# Patient Record
Sex: Male | Born: 1950
Health system: Southern US, Community
[De-identification: ages and names within clinical notes are randomized; demographics above are authoritative.]

## PROBLEM LIST (undated history)

## (undated) DIAGNOSIS — K760 Fatty (change of) liver, not elsewhere classified: Secondary | ICD-10-CM

## (undated) DIAGNOSIS — C61 Malignant neoplasm of prostate: Secondary | ICD-10-CM

## (undated) DIAGNOSIS — K5792 Diverticulitis of intestine, part unspecified, without perforation or abscess without bleeding: Secondary | ICD-10-CM

## (undated) DIAGNOSIS — I10 Essential (primary) hypertension: Secondary | ICD-10-CM

## (undated) DIAGNOSIS — M1990 Unspecified osteoarthritis, unspecified site: Secondary | ICD-10-CM

## (undated) DIAGNOSIS — I6529 Occlusion and stenosis of unspecified carotid artery: Secondary | ICD-10-CM

## (undated) DIAGNOSIS — E119 Type 2 diabetes mellitus without complications: Secondary | ICD-10-CM

## (undated) DIAGNOSIS — K219 Gastro-esophageal reflux disease without esophagitis: Secondary | ICD-10-CM

## (undated) DIAGNOSIS — J302 Other seasonal allergic rhinitis: Secondary | ICD-10-CM

## (undated) DIAGNOSIS — E785 Hyperlipidemia, unspecified: Secondary | ICD-10-CM

## (undated) DIAGNOSIS — M722 Plantar fascial fibromatosis: Secondary | ICD-10-CM

## (undated) DIAGNOSIS — Z87442 Personal history of urinary calculi: Secondary | ICD-10-CM

## (undated) DIAGNOSIS — Z8051 Family history of malignant neoplasm of kidney: Secondary | ICD-10-CM

## (undated) DIAGNOSIS — M109 Gout, unspecified: Secondary | ICD-10-CM

## (undated) DIAGNOSIS — G4733 Obstructive sleep apnea (adult) (pediatric): Secondary | ICD-10-CM

## (undated) HISTORY — DX: Essential (primary) hypertension: I10

## (undated) HISTORY — DX: Diverticulitis of intestine, part unspecified, without perforation or abscess without bleeding: K57.92

## (undated) HISTORY — DX: Type 2 diabetes mellitus without complications: E11.9

## (undated) HISTORY — DX: Family history of malignant neoplasm of kidney: Z80.51

## (undated) HISTORY — PX: PROSTATE BIOPSY: SHX241

## (undated) HISTORY — DX: Occlusion and stenosis of unspecified carotid artery: I65.29

## (undated) HISTORY — DX: Hyperlipidemia, unspecified: E78.5

## (undated) HISTORY — DX: Obstructive sleep apnea (adult) (pediatric): G47.33

## (undated) HISTORY — DX: Other seasonal allergic rhinitis: J30.2

## (undated) HISTORY — DX: Gout, unspecified: M10.9

## (undated) HISTORY — PX: NEPHRECTOMY: SHX65

---

## 2003-02-07 DIAGNOSIS — Z8051 Family history of malignant neoplasm of kidney: Secondary | ICD-10-CM

## 2003-02-07 HISTORY — DX: Family history of malignant neoplasm of kidney: Z80.51

## 2003-05-25 ENCOUNTER — Ambulatory Visit (HOSPITAL_BASED_OUTPATIENT_CLINIC_OR_DEPARTMENT_OTHER): Admission: RE | Admit: 2003-05-25 | Discharge: 2003-05-25 | Payer: Self-pay | Admitting: Pulmonary Disease

## 2003-07-24 ENCOUNTER — Observation Stay (HOSPITAL_COMMUNITY): Admission: EM | Admit: 2003-07-24 | Discharge: 2003-07-26 | Payer: Self-pay | Admitting: Emergency Medicine

## 2003-07-29 ENCOUNTER — Encounter (INDEPENDENT_AMBULATORY_CARE_PROVIDER_SITE_OTHER): Payer: Self-pay | Admitting: Specialist

## 2003-07-29 ENCOUNTER — Inpatient Hospital Stay (HOSPITAL_COMMUNITY): Admission: RE | Admit: 2003-07-29 | Discharge: 2003-08-01 | Payer: Self-pay | Admitting: Urology

## 2003-12-30 ENCOUNTER — Ambulatory Visit: Payer: Self-pay | Admitting: Family Medicine

## 2004-01-04 ENCOUNTER — Ambulatory Visit (HOSPITAL_COMMUNITY): Admission: RE | Admit: 2004-01-04 | Discharge: 2004-01-04 | Payer: Self-pay | Admitting: Urology

## 2004-01-05 ENCOUNTER — Encounter: Payer: Self-pay | Admitting: Family Medicine

## 2004-05-17 ENCOUNTER — Ambulatory Visit: Payer: Self-pay | Admitting: Family Medicine

## 2004-06-07 ENCOUNTER — Ambulatory Visit: Payer: Self-pay | Admitting: Family Medicine

## 2004-06-22 ENCOUNTER — Ambulatory Visit: Payer: Self-pay | Admitting: Family Medicine

## 2004-06-23 ENCOUNTER — Ambulatory Visit (HOSPITAL_COMMUNITY): Admission: RE | Admit: 2004-06-23 | Discharge: 2004-06-23 | Payer: Self-pay | Admitting: Urology

## 2004-06-23 ENCOUNTER — Encounter: Payer: Self-pay | Admitting: Family Medicine

## 2004-07-01 ENCOUNTER — Ambulatory Visit (HOSPITAL_COMMUNITY): Admission: RE | Admit: 2004-07-01 | Discharge: 2004-07-01 | Payer: Self-pay | Admitting: Urology

## 2004-07-13 ENCOUNTER — Ambulatory Visit (HOSPITAL_COMMUNITY): Admission: RE | Admit: 2004-07-13 | Discharge: 2004-07-13 | Payer: Self-pay | Admitting: Family Medicine

## 2004-07-13 ENCOUNTER — Encounter: Payer: Self-pay | Admitting: Family Medicine

## 2004-07-13 ENCOUNTER — Encounter (INDEPENDENT_AMBULATORY_CARE_PROVIDER_SITE_OTHER): Payer: Self-pay | Admitting: *Deleted

## 2004-07-19 ENCOUNTER — Ambulatory Visit: Payer: Self-pay | Admitting: Family Medicine

## 2004-07-20 ENCOUNTER — Encounter: Admission: RE | Admit: 2004-07-20 | Discharge: 2004-07-20 | Payer: Self-pay | Admitting: Family Medicine

## 2004-08-30 ENCOUNTER — Ambulatory Visit: Payer: Self-pay | Admitting: Cardiology

## 2004-09-20 ENCOUNTER — Ambulatory Visit: Payer: Self-pay

## 2005-06-12 ENCOUNTER — Ambulatory Visit (HOSPITAL_COMMUNITY): Admission: RE | Admit: 2005-06-12 | Discharge: 2005-06-12 | Payer: Self-pay | Admitting: Urology

## 2005-09-25 ENCOUNTER — Ambulatory Visit: Payer: Self-pay | Admitting: Family Medicine

## 2005-09-28 ENCOUNTER — Ambulatory Visit: Payer: Self-pay | Admitting: Family Medicine

## 2005-11-17 ENCOUNTER — Ambulatory Visit: Payer: Self-pay | Admitting: Family Medicine

## 2005-11-17 LAB — CONVERTED CEMR LAB
AST: 23 units/L (ref 0–37)
Albumin: 3.6 g/dL (ref 3.5–5.2)
BUN: 15 mg/dL (ref 6–23)
Basophils Absolute: 0 10*3/uL (ref 0.0–0.1)
CO2: 30 meq/L (ref 19–32)
Chloride: 105 meq/L (ref 96–112)
Creatinine, Ser: 1.6 mg/dL — ABNORMAL HIGH (ref 0.4–1.5)
Eosinophil percent: 6.7 % — ABNORMAL HIGH (ref 0.0–5.0)
GFR calc non Af Amer: 48 mL/min
Glomerular Filtration Rate, Af Am: 58 mL/min/{1.73_m2}
HCT: 46 % (ref 39.0–52.0)
Hemoglobin: 15.6 g/dL (ref 13.0–17.0)
MCHC: 33.9 g/dL (ref 30.0–36.0)
MCV: 91.9 fL (ref 78.0–100.0)
Monocytes Absolute: 0.5 10*3/uL (ref 0.2–0.7)
Neutrophils Relative %: 56.5 % (ref 43.0–77.0)
Total Bilirubin: 0.7 mg/dL (ref 0.3–1.2)
WBC: 6.3 10*3/uL (ref 4.5–10.5)

## 2005-11-28 ENCOUNTER — Ambulatory Visit: Payer: Self-pay | Admitting: Family Medicine

## 2005-12-13 ENCOUNTER — Ambulatory Visit: Payer: Self-pay | Admitting: Family Medicine

## 2006-06-19 ENCOUNTER — Ambulatory Visit (HOSPITAL_COMMUNITY): Admission: RE | Admit: 2006-06-19 | Discharge: 2006-06-19 | Payer: Self-pay | Admitting: Urology

## 2006-09-27 ENCOUNTER — Telehealth (INDEPENDENT_AMBULATORY_CARE_PROVIDER_SITE_OTHER): Payer: Self-pay | Admitting: *Deleted

## 2006-11-26 ENCOUNTER — Encounter: Admission: RE | Admit: 2006-11-26 | Discharge: 2006-11-26 | Payer: Self-pay | Admitting: General Surgery

## 2007-04-16 ENCOUNTER — Ambulatory Visit: Payer: Self-pay | Admitting: Family Medicine

## 2007-04-16 DIAGNOSIS — I10 Essential (primary) hypertension: Secondary | ICD-10-CM | POA: Insufficient documentation

## 2007-04-16 DIAGNOSIS — K5730 Diverticulosis of large intestine without perforation or abscess without bleeding: Secondary | ICD-10-CM | POA: Insufficient documentation

## 2007-04-16 DIAGNOSIS — Z8719 Personal history of other diseases of the digestive system: Secondary | ICD-10-CM | POA: Insufficient documentation

## 2007-04-19 ENCOUNTER — Encounter: Admission: RE | Admit: 2007-04-19 | Discharge: 2007-04-19 | Payer: Self-pay | Admitting: Gastroenterology

## 2007-06-18 ENCOUNTER — Ambulatory Visit (HOSPITAL_COMMUNITY): Admission: RE | Admit: 2007-06-18 | Discharge: 2007-06-18 | Payer: Self-pay | Admitting: Urology

## 2007-07-09 ENCOUNTER — Ambulatory Visit: Payer: Self-pay | Admitting: Family Medicine

## 2007-07-09 LAB — CONVERTED CEMR LAB
AST: 25 units/L (ref 0–37)
Albumin: 3.8 g/dL (ref 3.5–5.2)
Alkaline Phosphatase: 57 units/L (ref 39–117)
BUN: 20 mg/dL (ref 6–23)
Basophils Absolute: 0 10*3/uL (ref 0.0–0.1)
Bilirubin, Direct: 0.1 mg/dL (ref 0.0–0.3)
Chloride: 103 meq/L (ref 96–112)
Eosinophils Absolute: 0.3 10*3/uL (ref 0.0–0.7)
GFR calc Af Amer: 67 mL/min
GFR calc non Af Amer: 55 mL/min
HCT: 46.1 % (ref 39.0–52.0)
HDL: 35.6 mg/dL — ABNORMAL LOW (ref >39.0)
MCHC: 34.7 g/dL (ref 30.0–36.0)
MCV: 91.2 fL (ref 78.0–100.0)
Monocytes Absolute: 0.5 10*3/uL (ref 0.1–1.0)
Neutrophils Relative %: 56.8 % (ref 43.0–77.0)
Platelets: 247 10*3/uL (ref 150–400)
Potassium: 4.3 meq/L (ref 3.5–5.1)
RDW: 12.4 % (ref 11.5–14.6)
Sodium: 142 meq/L (ref 135–145)
Total Bilirubin: 1 mg/dL (ref 0.3–1.2)
Triglycerides: 153 mg/dL — ABNORMAL HIGH (ref 0–149)
VLDL: 31 mg/dL (ref 0–40)
WBC: 5.8 10*3/uL (ref 4.5–10.5)

## 2007-07-11 ENCOUNTER — Ambulatory Visit (HOSPITAL_BASED_OUTPATIENT_CLINIC_OR_DEPARTMENT_OTHER): Admission: RE | Admit: 2007-07-11 | Discharge: 2007-07-11 | Payer: Self-pay | Admitting: Urology

## 2007-10-15 ENCOUNTER — Ambulatory Visit: Payer: Self-pay | Admitting: Family Medicine

## 2007-10-15 DIAGNOSIS — IMO0002 Reserved for concepts with insufficient information to code with codable children: Secondary | ICD-10-CM | POA: Insufficient documentation

## 2007-11-07 ENCOUNTER — Encounter: Admission: RE | Admit: 2007-11-07 | Discharge: 2007-11-07 | Payer: Self-pay | Admitting: Orthopaedic Surgery

## 2007-11-14 ENCOUNTER — Ambulatory Visit: Payer: Self-pay | Admitting: Family Medicine

## 2007-11-14 DIAGNOSIS — J309 Allergic rhinitis, unspecified: Secondary | ICD-10-CM | POA: Insufficient documentation

## 2007-11-14 DIAGNOSIS — C649 Malignant neoplasm of unspecified kidney, except renal pelvis: Secondary | ICD-10-CM | POA: Insufficient documentation

## 2007-12-09 ENCOUNTER — Telehealth: Payer: Self-pay | Admitting: Family Medicine

## 2007-12-10 ENCOUNTER — Telehealth: Payer: Self-pay | Admitting: *Deleted

## 2007-12-13 ENCOUNTER — Telehealth: Payer: Self-pay | Admitting: Family Medicine

## 2008-07-09 ENCOUNTER — Ambulatory Visit (HOSPITAL_COMMUNITY): Admission: RE | Admit: 2008-07-09 | Discharge: 2008-07-09 | Payer: Self-pay | Admitting: Urology

## 2008-07-22 ENCOUNTER — Ambulatory Visit (HOSPITAL_COMMUNITY): Admission: RE | Admit: 2008-07-22 | Discharge: 2008-07-22 | Payer: Self-pay | Admitting: Urology

## 2008-07-31 ENCOUNTER — Ambulatory Visit: Payer: Self-pay | Admitting: Oncology

## 2008-07-31 ENCOUNTER — Telehealth: Payer: Self-pay | Admitting: Family Medicine

## 2008-09-04 ENCOUNTER — Ambulatory Visit: Payer: Self-pay | Admitting: Oncology

## 2008-12-18 ENCOUNTER — Encounter: Admission: RE | Admit: 2008-12-18 | Discharge: 2008-12-18 | Payer: Self-pay | Admitting: Gastroenterology

## 2009-02-04 ENCOUNTER — Ambulatory Visit: Payer: Self-pay | Admitting: Family Medicine

## 2009-02-06 HISTORY — PX: COLON SURGERY: SHX602

## 2009-03-10 ENCOUNTER — Ambulatory Visit: Payer: Self-pay | Admitting: Oncology

## 2009-06-11 ENCOUNTER — Ambulatory Visit: Payer: Self-pay | Admitting: Oncology

## 2009-07-13 ENCOUNTER — Ambulatory Visit: Payer: Self-pay | Admitting: Family Medicine

## 2009-07-13 DIAGNOSIS — R079 Chest pain, unspecified: Secondary | ICD-10-CM | POA: Insufficient documentation

## 2009-07-30 ENCOUNTER — Ambulatory Visit (HOSPITAL_COMMUNITY): Admission: RE | Admit: 2009-07-30 | Discharge: 2009-07-30 | Payer: Self-pay | Admitting: Urology

## 2009-08-06 HISTORY — PX: COLONOSCOPY: SHX174

## 2009-08-12 ENCOUNTER — Encounter: Payer: Self-pay | Admitting: Family Medicine

## 2009-08-27 ENCOUNTER — Inpatient Hospital Stay (HOSPITAL_COMMUNITY): Admission: RE | Admit: 2009-08-27 | Discharge: 2009-09-01 | Payer: Self-pay | Admitting: General Surgery

## 2009-08-27 ENCOUNTER — Encounter (INDEPENDENT_AMBULATORY_CARE_PROVIDER_SITE_OTHER): Payer: Self-pay | Admitting: General Surgery

## 2009-09-07 ENCOUNTER — Inpatient Hospital Stay (HOSPITAL_COMMUNITY): Admission: RE | Admit: 2009-09-07 | Discharge: 2009-09-15 | Payer: Self-pay | Admitting: General Surgery

## 2009-09-07 ENCOUNTER — Encounter: Admission: RE | Admit: 2009-09-07 | Discharge: 2009-09-07 | Payer: Self-pay | Admitting: General Surgery

## 2009-09-10 ENCOUNTER — Ambulatory Visit: Payer: Self-pay | Admitting: Oncology

## 2009-09-16 ENCOUNTER — Ambulatory Visit: Payer: Self-pay | Admitting: Oncology

## 2009-09-20 ENCOUNTER — Encounter: Admission: RE | Admit: 2009-09-20 | Discharge: 2009-09-20 | Payer: Self-pay | Admitting: General Surgery

## 2009-09-29 ENCOUNTER — Inpatient Hospital Stay (HOSPITAL_COMMUNITY): Admission: EM | Admit: 2009-09-29 | Discharge: 2009-10-04 | Payer: Self-pay | Admitting: Emergency Medicine

## 2009-10-01 ENCOUNTER — Ambulatory Visit: Payer: Self-pay | Admitting: Infectious Diseases

## 2009-11-01 ENCOUNTER — Encounter: Admission: RE | Admit: 2009-11-01 | Discharge: 2009-11-01 | Payer: Self-pay | Admitting: General Surgery

## 2009-11-09 ENCOUNTER — Ambulatory Visit: Payer: Self-pay | Admitting: Family Medicine

## 2009-12-02 ENCOUNTER — Ambulatory Visit: Payer: Self-pay | Admitting: Oncology

## 2009-12-06 ENCOUNTER — Encounter: Payer: Self-pay | Admitting: Family Medicine

## 2010-03-08 NOTE — Letter (Signed)
Summary: [facility]  [facility]   Imported By: Maryln Gottron 12/20/2009 09:21:20  _____________________________________________________________________  External Attachment:    Type:   Image     Comment:   External Document

## 2010-03-08 NOTE — Letter (Signed)
Summary: [facility]   Imported By: Maryln Gottron 08/23/2009 11:10:13  _____________________________________________________________________  External Attachment:    Type:   Image     Comment:   External Document

## 2010-03-08 NOTE — Assessment & Plan Note (Signed)
Summary: FLU SHOT // RS pt rsc/njr  Nurse Visit   Allergies: No Known Drug Allergies  Orders Added: 1)  Admin 1st Vaccine [90471] 2)  Flu Vaccine 46yrs + [57322] Flu Vaccine Consent Questions     Do you have a history of severe allergic reactions to this vaccine? no    Any prior history of allergic reactions to egg and/or gelatin? no    Do you have a sensitivity to the preservative Thimersol? no    Do you have a past history of Guillan-Barre Syndrome? no    Do you currently have an acute febrile illness? no    Have you ever had a severe reaction to latex? no    Vaccine information given and explained to patient? yes    Are you currently pregnant? no    Lot Number:[id number]   Exp Date:08/06/2010   Site Given  Left Deltoid IM .lbflu

## 2010-03-08 NOTE — Assessment & Plan Note (Signed)
Summary: chest pain/dm/ no sob on exertion, or other cardiac symptoms/dm   Vital Signs:  Patient profile:   60 year old male Weight:      185 pounds Temp:     98.2 degrees F oral BP sitting:   140 / 90  (left arm) Cuff size:   regular  Vitals Entered By: Kathrynn Speed CMA (July 13, 2009 3:51 PM) CC: chest pain   CC:  chest pain.  History of Present Illness: Jacob Stone is a 60 year old male, who comes in today for evaluation of chest pain.  He states last Thursday evening, around 5 p.m. he was sitting watching television when he noticed some discomfort in his mid sternal area.  He described as a tightness sensation that did not radiate.  He had no nausea, vomiting, shortness of breath at diaphoresis, nor radiation of the discomfort.  That night, when, on and off for 15 minutes for 45 hours and finally went away.  He has a history of reflux esophagitis.  On Friday the next day in the evening.  He had some other symptoms consistent with Thursday.  Since that, time.  He's had intermittent episodes.  In the meantime, he started to Jacob Stone's exercise, and indeed, the exercise seems to make the pain go away.  He has underlying hypertension on Zestril 40 mg two tabs daily, Norvasc 5 daily, otherwise, no risk factors  Current Medications (verified): 1)  Zyrtec Allergy 10 Mg Tabs (Cetirizine Hcl) 2)  Aspirin 325 Mg  Tabs (Aspirin) .... Take 1 Tablet By Mouth Once A Day 3)  Zestril 40 Mg  Tabs (Lisinopril) .... 2 Tabs Qam 4)  Norvasc 5 Mg Tabs (Amlodipine Besylate) .... Take 1 Tablet By Mouth Every Morning 5)  Anusol 1-12.5 % Oint (Pramoxine-Zinc Oxide in Mo) .... Apply At Bedtime  Allergies (verified): No Known Drug Allergies PMH-FH-SH reviewed-no changes except otherwise noted  Review of Systems      See HPI  Physical Exam  General:  Well-developed,well-nourished,in no acute distress; alert,appropriate and cooperative throughout examination Neck:  No deformities, masses, or tenderness  noted. Chest Wall:  No deformities, masses, tenderness or gynecomastia noted. Lungs:  Normal respiratory effort, chest expands symmetrically. Lungs are clear to auscultation, no crackles or wheezes. Heart:  Normal rate and regular rhythm. S1 and S2 normal without gallop, murmur, click, rub or other extra sounds.   Problems:  Medical Problems Added: 1)  Dx of Chest Pain  (ICD-786.50)  Impression & Recommendations:  Problem # 1:  CHEST PAIN (ICD-786.50) Assessment New  Orders: EKG w/ Interpretation (93000)  Complete Medication List: 1)  Zyrtec Allergy 10 Mg Tabs (Cetirizine hcl) 2)  Aspirin 325 Mg Tabs (Aspirin) .... Take 1 tablet by mouth once a day 3)  Zestril 40 Mg Tabs (Lisinopril) .... 2 tabs qam 4)  Norvasc 5 Mg Tabs (Amlodipine besylate) .... Take 1 tablet by mouth every morning 5)  Anusol 1-12.5 % Oint (Pramoxine-zinc oxide in mo) .... Apply at bedtime  Patient Instructions: 1)  discussed with your gastroenterologist at your next visit   The discomfort, your having.  I think, is most likely GI related.  In the meantime take it 20 mg OTC Prilosec twice daily

## 2010-03-22 ENCOUNTER — Encounter: Payer: Self-pay | Admitting: Family Medicine

## 2010-03-22 ENCOUNTER — Ambulatory Visit (INDEPENDENT_AMBULATORY_CARE_PROVIDER_SITE_OTHER): Payer: Managed Care, Other (non HMO) | Admitting: Family Medicine

## 2010-03-22 DIAGNOSIS — L298 Other pruritus: Secondary | ICD-10-CM

## 2010-03-22 DIAGNOSIS — I10 Essential (primary) hypertension: Secondary | ICD-10-CM

## 2010-03-22 DIAGNOSIS — IMO0002 Reserved for concepts with insufficient information to code with codable children: Secondary | ICD-10-CM

## 2010-03-22 MED ORDER — PREDNISONE 20 MG PO TABS: ORAL_TABLET | ORAL | Status: DC

## 2010-03-22 NOTE — Patient Instructions (Signed)
Prednisone, take one tab x 5 days, a half x 5 days, then half a tablet every other day for a two week taper.  You can take plain Claritin in the morning or Benadryl at bedtime.  Continue the Norvasc 5 mg daily.  Return for your annual physical examination

## 2010-03-22 NOTE — Progress Notes (Signed)
  Subjective:    Patient ID: Jacob Stone, male    DOB: Aug 03, 1950, 60 y.o.   MRN: [id number]  HPI Jacob Stone is a 60 year old, married male, nonsmoker, who comes in today for evaluation of pruritus times 6+ weeks.  He had a history of dry skin.  Allergic rhinitis in the winter.  It should previously.  However, this latter seems to be worse and he itches all the time.  No skin rash.  His dermatologist, Dr. Yetta Barre.  Advised a moisturizer, but it doesn't seem to be working he's also tried Benadryl.  He is currently on Norvasc 5 mg daily for hypertension.  BP 130/90.  The lisinopril was discontinued because he had a nephrectomy for renal cell carcinoma, and they were concerned about him only having one kidney and the potential negative effect of the ACE inhibitor and that kidney   Review of Systems    Negative Objective:   Physical Exam    Well-developed well-nourished, male in no acute distress.  Total skin exam normal except is very dry.  No skin rash    Assessment & Plan:  Winter itch.  Hypertension.  Plan prednisone burst and taper then go back to normal treatment for dry skin.  Continue the Norvasc for your blood pressure

## 2010-04-21 LAB — COMPREHENSIVE METABOLIC PANEL
ALT: 12 U/L (ref 0–53)
ALT: 8 U/L (ref 0–53)
AST: 10 U/L (ref 0–37)
AST: 12 U/L (ref 0–37)
AST: 18 U/L (ref 0–37)
Albumin: 2.8 g/dL — ABNORMAL LOW (ref 3.5–5.2)
Albumin: 3 g/dL — ABNORMAL LOW (ref 3.5–5.2)
Albumin: 3.3 g/dL — ABNORMAL LOW (ref 3.5–5.2)
Alkaline Phosphatase: 42 U/L (ref 39–117)
Alkaline Phosphatase: 52 U/L (ref 39–117)
BUN: 10 mg/dL (ref 6–23)
BUN: 18 mg/dL (ref 6–23)
CO2: 24 mEq/L (ref 19–32)
CO2: 28 mEq/L (ref 19–32)
Calcium: 8.6 mg/dL (ref 8.4–10.5)
Chloride: 108 mEq/L (ref 96–112)
Chloride: 109 mEq/L (ref 96–112)
GFR calc Af Amer: >60 mL/min (ref >60)
GFR calc non Af Amer: 44 mL/min — ABNORMAL LOW (ref >60)
Potassium: 3.2 mEq/L — ABNORMAL LOW (ref 3.5–5.1)
Potassium: 4.2 mEq/L (ref 3.5–5.1)
Sodium: 139 mEq/L (ref 135–145)
Sodium: 142 mEq/L (ref 135–145)
Total Bilirubin: 0.2 mg/dL — ABNORMAL LOW (ref 0.3–1.2)
Total Bilirubin: 0.8 mg/dL (ref 0.3–1.2)
Total Protein: 6.3 g/dL (ref 6.0–8.3)

## 2010-04-21 LAB — CBC
Hemoglobin: 11.1 g/dL — ABNORMAL LOW (ref 13.0–17.0)
Hemoglobin: 11.3 g/dL — ABNORMAL LOW (ref 13.0–17.0)
Hemoglobin: 12.6 g/dL — ABNORMAL LOW (ref 13.0–17.0)
MCH: 30.4 pg (ref 26.0–34.0)
MCHC: 33.5 g/dL (ref 30.0–36.0)
MCHC: 33.6 g/dL (ref 30.0–36.0)
MCHC: 34.1 g/dL (ref 30.0–36.0)
MCV: 89.9 fL (ref 78.0–100.0)
MCV: 90.8 fL (ref 78.0–100.0)
Platelets: 327 10*3/uL (ref 150–400)
Platelets: 385 10*3/uL (ref 150–400)
Platelets: 417 10*3/uL — ABNORMAL HIGH (ref 150–400)
RBC: 3.64 MIL/uL — ABNORMAL LOW (ref 4.22–5.81)
RBC: 3.93 MIL/uL — ABNORMAL LOW (ref 4.22–5.81)
RBC: 4.09 MIL/uL — ABNORMAL LOW (ref 4.22–5.81)
RBC: 4.11 MIL/uL — ABNORMAL LOW (ref 4.22–5.81)
RDW: 13 % (ref 11.5–15.5)
WBC: 13.1 10*3/uL — ABNORMAL HIGH (ref 4.0–10.5)
WBC: 7.7 10*3/uL (ref 4.0–10.5)
WBC: 8.5 10*3/uL (ref 4.0–10.5)

## 2010-04-21 LAB — DIFFERENTIAL
Basophils Absolute: 0 10*3/uL (ref 0.0–0.1)
Basophils Absolute: 0.1 10*3/uL (ref 0.0–0.1)
Basophils Absolute: 0.1 10*3/uL (ref 0.0–0.1)
Basophils Relative: 0 % (ref 0–1)
Basophils Relative: 1 % (ref 0–1)
Basophils Relative: 1 % (ref 0–1)
Eosinophils Absolute: 0.2 10*3/uL (ref 0.0–0.7)
Eosinophils Absolute: 0.2 10*3/uL (ref 0.0–0.7)
Eosinophils Absolute: 0.3 10*3/uL (ref 0.0–0.7)
Eosinophils Relative: 0 % (ref 0–5)
Eosinophils Relative: 3 % (ref 0–5)
Eosinophils Relative: 4 % (ref 0–5)
Lymphs Abs: 1.4 10*3/uL (ref 0.7–4.0)
Lymphs Abs: 1.6 10*3/uL (ref 0.7–4.0)
Monocytes Absolute: 0.7 10*3/uL (ref 0.1–1.0)
Monocytes Absolute: 0.9 10*3/uL (ref 0.1–1.0)
Monocytes Relative: 10 % (ref 3–12)
Monocytes Relative: 8 % (ref 3–12)
Neutro Abs: 5.8 10*3/uL (ref 1.7–7.7)
Neutrophils Relative %: 61 % (ref 43–77)

## 2010-04-21 LAB — PHOSPHORUS: Phosphorus: 4.9 mg/dL — ABNORMAL HIGH (ref 2.3–4.6)

## 2010-04-21 LAB — BASIC METABOLIC PANEL
CO2: 28 mEq/L (ref 19–32)
Calcium: 8.4 mg/dL (ref 8.4–10.5)
Calcium: 8.8 mg/dL (ref 8.4–10.5)
Calcium: 8.8 mg/dL (ref 8.4–10.5)
Creatinine, Ser: 1.12 mg/dL (ref 0.4–1.5)
GFR calc Af Amer: 53 mL/min — ABNORMAL LOW (ref >60)
GFR calc Af Amer: >60 mL/min (ref >60)
GFR calc Af Amer: >60 mL/min (ref >60)
GFR calc non Af Amer: 44 mL/min — ABNORMAL LOW (ref >60)
GFR calc non Af Amer: >60 mL/min (ref >60)
Sodium: 140 mEq/L (ref 135–145)
Sodium: 143 mEq/L (ref 135–145)

## 2010-04-21 LAB — GLUCOSE, CAPILLARY
Glucose-Capillary: 114 mg/dL — ABNORMAL HIGH (ref 70–99)
Glucose-Capillary: 121 mg/dL — ABNORMAL HIGH (ref 70–99)
Glucose-Capillary: 124 mg/dL — ABNORMAL HIGH (ref 70–99)
Glucose-Capillary: 130 mg/dL — ABNORMAL HIGH (ref 70–99)
Glucose-Capillary: 132 mg/dL — ABNORMAL HIGH (ref 70–99)
Glucose-Capillary: 134 mg/dL — ABNORMAL HIGH (ref 70–99)

## 2010-04-21 LAB — RENAL FUNCTION PANEL
Albumin: 2.9 g/dL — ABNORMAL LOW (ref 3.5–5.2)
BUN: 10 mg/dL (ref 6–23)
Calcium: 8.8 mg/dL (ref 8.4–10.5)
Creatinine, Ser: 1.14 mg/dL (ref 0.4–1.5)
Phosphorus: 4.9 mg/dL — ABNORMAL HIGH (ref 2.3–4.6)

## 2010-04-21 LAB — URINALYSIS, ROUTINE W REFLEX MICROSCOPIC
Glucose, UA: NEGATIVE mg/dL
Leukocytes, UA: NEGATIVE
Protein, ur: NEGATIVE mg/dL
Specific Gravity, Urine: 1.012 (ref 1.005–1.030)
pH: 5.5 (ref 5.0–8.0)

## 2010-04-21 LAB — MAGNESIUM
Magnesium: 1.9 mg/dL (ref 1.5–2.5)
Magnesium: 2 mg/dL (ref 1.5–2.5)

## 2010-04-21 LAB — PREALBUMIN: Prealbumin: 15.4 mg/dL — ABNORMAL LOW (ref 18.0–45.0)

## 2010-04-21 LAB — URINE MICROSCOPIC-ADD ON

## 2010-04-21 LAB — URINE CULTURE: Colony Count: NO GROWTH

## 2010-04-21 LAB — CULTURE, BLOOD (ROUTINE X 2): Culture: NO GROWTH

## 2010-04-22 LAB — BASIC METABOLIC PANEL
BUN: 10 mg/dL (ref 6–23)
BUN: 14 mg/dL (ref 6–23)
CO2: 28 mEq/L (ref 19–32)
CO2: 29 mEq/L (ref 19–32)
Calcium: 9 mg/dL (ref 8.4–10.5)
Calcium: 9.1 mg/dL (ref 8.4–10.5)
Chloride: 104 mEq/L (ref 96–112)
Chloride: 105 mEq/L (ref 96–112)
Chloride: 106 mEq/L (ref 96–112)
Creatinine, Ser: 1 mg/dL (ref 0.4–1.5)
Creatinine, Ser: 1.05 mg/dL (ref 0.4–1.5)
Creatinine, Ser: 1.22 mg/dL (ref 0.4–1.5)
GFR calc Af Amer: >60 mL/min (ref >60)
GFR calc Af Amer: >60 mL/min (ref >60)
GFR calc Af Amer: >60 mL/min (ref >60)
GFR calc non Af Amer: >60 mL/min (ref >60)
GFR calc non Af Amer: >60 mL/min (ref >60)
GFR calc non Af Amer: >60 mL/min (ref >60)
Glucose, Bld: 119 mg/dL — ABNORMAL HIGH (ref 70–99)
Glucose, Bld: 124 mg/dL — ABNORMAL HIGH (ref 70–99)
Potassium: 4 mEq/L (ref 3.5–5.1)
Potassium: 4.1 mEq/L (ref 3.5–5.1)
Potassium: 4.4 mEq/L (ref 3.5–5.1)
Sodium: 140 mEq/L (ref 135–145)
Sodium: 141 mEq/L (ref 135–145)

## 2010-04-22 LAB — COMPREHENSIVE METABOLIC PANEL
ALT: 17 U/L (ref 0–53)
ALT: 20 U/L (ref 0–53)
ALT: 22 U/L (ref 0–53)
AST: 14 U/L (ref 0–37)
AST: 15 U/L (ref 0–37)
AST: 18 U/L (ref 0–37)
Albumin: 3.1 g/dL — ABNORMAL LOW (ref 3.5–5.2)
Albumin: 3.1 g/dL — ABNORMAL LOW (ref 3.5–5.2)
Albumin: 3.6 g/dL (ref 3.5–5.2)
Alkaline Phosphatase: 57 U/L (ref 39–117)
Alkaline Phosphatase: 70 U/L (ref 39–117)
BUN: 10 mg/dL (ref 6–23)
BUN: 12 mg/dL (ref 6–23)
CO2: 25 mEq/L (ref 19–32)
CO2: 27 mEq/L (ref 19–32)
CO2: 28 mEq/L (ref 19–32)
Calcium: 8.9 mg/dL (ref 8.4–10.5)
Calcium: 9 mg/dL (ref 8.4–10.5)
Calcium: 9.2 mg/dL (ref 8.4–10.5)
Chloride: 102 mEq/L (ref 96–112)
Chloride: 105 mEq/L (ref 96–112)
Creatinine, Ser: 1.15 mg/dL (ref 0.4–1.5)
Creatinine, Ser: 1.18 mg/dL (ref 0.4–1.5)
GFR calc Af Amer: >60 mL/min (ref >60)
GFR calc Af Amer: >60 mL/min (ref >60)
GFR calc Af Amer: >60 mL/min (ref >60)
GFR calc non Af Amer: >60 mL/min (ref >60)
GFR calc non Af Amer: >60 mL/min (ref >60)
GFR calc non Af Amer: >60 mL/min (ref >60)
Glucose, Bld: 107 mg/dL — ABNORMAL HIGH (ref 70–99)
Glucose, Bld: 117 mg/dL — ABNORMAL HIGH (ref 70–99)
Potassium: 4 mEq/L (ref 3.5–5.1)
Potassium: 4 mEq/L (ref 3.5–5.1)
Sodium: 136 mEq/L (ref 135–145)
Sodium: 140 mEq/L (ref 135–145)
Sodium: 141 mEq/L (ref 135–145)
Total Bilirubin: 0.6 mg/dL (ref 0.3–1.2)
Total Bilirubin: 0.8 mg/dL (ref 0.3–1.2)
Total Protein: 6.5 g/dL (ref 6.0–8.3)
Total Protein: 6.6 g/dL (ref 6.0–8.3)
Total Protein: 7.6 g/dL (ref 6.0–8.3)

## 2010-04-22 LAB — CBC
HCT: 38.3 % — ABNORMAL LOW (ref 39.0–52.0)
HCT: 40.7 % (ref 39.0–52.0)
Hemoglobin: 13.2 g/dL (ref 13.0–17.0)
Hemoglobin: 13.5 g/dL (ref 13.0–17.0)
Hemoglobin: 13.6 g/dL (ref 13.0–17.0)
Hemoglobin: 14 g/dL (ref 13.0–17.0)
MCH: 31.2 pg (ref 26.0–34.0)
MCH: 31.3 pg (ref 26.0–34.0)
MCH: 31.3 pg (ref 26.0–34.0)
MCHC: 34.4 g/dL (ref 30.0–36.0)
MCHC: 34.5 g/dL (ref 30.0–36.0)
MCHC: 34.7 g/dL (ref 30.0–36.0)
MCHC: 34.8 g/dL (ref 30.0–36.0)
MCV: 90.7 fL (ref 78.0–100.0)
MCV: 90.8 fL (ref 78.0–100.0)
Platelets: 381 10*3/uL (ref 150–400)
Platelets: 409 10*3/uL — ABNORMAL HIGH (ref 150–400)
Platelets: 411 10*3/uL — ABNORMAL HIGH (ref 150–400)
Platelets: 430 10*3/uL — ABNORMAL HIGH (ref 150–400)
RBC: 4.22 MIL/uL (ref 4.22–5.81)
RBC: 4.32 MIL/uL (ref 4.22–5.81)
RBC: 4.48 MIL/uL (ref 4.22–5.81)
RDW: 12.7 % (ref 11.5–15.5)
RDW: 12.7 % (ref 11.5–15.5)
WBC: 12 10*3/uL — ABNORMAL HIGH (ref 4.0–10.5)
WBC: 7.9 10*3/uL (ref 4.0–10.5)

## 2010-04-22 LAB — GLUCOSE, CAPILLARY
Glucose-Capillary: 114 mg/dL — ABNORMAL HIGH (ref 70–99)
Glucose-Capillary: 116 mg/dL — ABNORMAL HIGH (ref 70–99)
Glucose-Capillary: 121 mg/dL — ABNORMAL HIGH (ref 70–99)
Glucose-Capillary: 124 mg/dL — ABNORMAL HIGH (ref 70–99)
Glucose-Capillary: 126 mg/dL — ABNORMAL HIGH (ref 70–99)
Glucose-Capillary: 129 mg/dL — ABNORMAL HIGH (ref 70–99)
Glucose-Capillary: 135 mg/dL — ABNORMAL HIGH (ref 70–99)
Glucose-Capillary: 143 mg/dL — ABNORMAL HIGH (ref 70–99)
Glucose-Capillary: 144 mg/dL — ABNORMAL HIGH (ref 70–99)

## 2010-04-22 LAB — DIFFERENTIAL
Eosinophils Absolute: 0.3 10*3/uL (ref 0.0–0.7)
Eosinophils Absolute: 0.4 10*3/uL (ref 0.0–0.7)
Eosinophils Relative: 3 % (ref 0–5)
Eosinophils Relative: 5 % (ref 0–5)
Lymphocytes Relative: 19 % (ref 12–46)
Lymphs Abs: 1.6 10*3/uL (ref 0.7–4.0)
Lymphs Abs: 1.7 10*3/uL (ref 0.7–4.0)
Monocytes Absolute: 0.6 10*3/uL (ref 0.1–1.0)
Monocytes Absolute: 0.7 10*3/uL (ref 0.1–1.0)
Monocytes Relative: 7 % (ref 3–12)
Monocytes Relative: 9 % (ref 3–12)
Neutrophils Relative %: 66 % (ref 43–77)

## 2010-04-22 LAB — TRIGLYCERIDES
Triglycerides: 122 mg/dL (ref <150)
Triglycerides: 135 mg/dL (ref <150)

## 2010-04-22 LAB — CHOLESTEROL, TOTAL
Cholesterol: 122 mg/dL (ref 0–200)
Cholesterol: 96 mg/dL (ref 0–200)

## 2010-04-22 LAB — PHOSPHORUS
Phosphorus: 4 mg/dL (ref 2.3–4.6)
Phosphorus: 4.3 mg/dL (ref 2.3–4.6)

## 2010-04-22 LAB — PREALBUMIN: Prealbumin: 18.3 mg/dL (ref 18.0–45.0)

## 2010-04-22 LAB — MAGNESIUM
Magnesium: 2.2 mg/dL (ref 1.5–2.5)
Magnesium: 2.2 mg/dL (ref 1.5–2.5)

## 2010-04-23 LAB — CBC
HCT: 44.4 % (ref 39.0–52.0)
Hemoglobin: 12.9 g/dL — ABNORMAL LOW (ref 13.0–17.0)
Hemoglobin: 15.5 g/dL (ref 13.0–17.0)
MCHC: 34.2 g/dL (ref 30.0–36.0)
MCHC: 34.9 g/dL (ref 30.0–36.0)
MCV: 89.6 fL (ref 78.0–100.0)
RBC: 4.16 MIL/uL — ABNORMAL LOW (ref 4.22–5.81)
RDW: 12.9 % (ref 11.5–15.5)
WBC: 7.9 10*3/uL (ref 4.0–10.5)

## 2010-04-23 LAB — SURGICAL PCR SCREEN
MRSA, PCR: NEGATIVE
Staphylococcus aureus: NEGATIVE

## 2010-04-23 LAB — URINALYSIS, ROUTINE W REFLEX MICROSCOPIC
Bilirubin Urine: NEGATIVE
Glucose, UA: NEGATIVE mg/dL
Ketones, ur: NEGATIVE mg/dL
Protein, ur: NEGATIVE mg/dL
pH: 5.5 (ref 5.0–8.0)

## 2010-04-23 LAB — BASIC METABOLIC PANEL
CO2: 28 mEq/L (ref 19–32)
Calcium: 7.9 mg/dL — ABNORMAL LOW (ref 8.4–10.5)
Calcium: 8.1 mg/dL — ABNORMAL LOW (ref 8.4–10.5)
Chloride: 108 mEq/L (ref 96–112)
GFR calc Af Amer: 56 mL/min — ABNORMAL LOW (ref >60)
GFR calc Af Amer: >60 mL/min (ref >60)
GFR calc non Af Amer: 46 mL/min — ABNORMAL LOW (ref >60)
GFR calc non Af Amer: >60 mL/min (ref >60)
Glucose, Bld: 110 mg/dL — ABNORMAL HIGH (ref 70–99)
Glucose, Bld: 117 mg/dL — ABNORMAL HIGH (ref 70–99)
Potassium: 4.3 mEq/L (ref 3.5–5.1)
Potassium: 4.6 mEq/L (ref 3.5–5.1)
Potassium: 5.1 mEq/L (ref 3.5–5.1)
Sodium: 135 mEq/L (ref 135–145)
Sodium: 138 mEq/L (ref 135–145)
Sodium: 141 mEq/L (ref 135–145)

## 2010-04-23 LAB — DIFFERENTIAL
Eosinophils Absolute: 0.3 10*3/uL (ref 0.0–0.7)
Eosinophils Relative: 6 % — ABNORMAL HIGH (ref 0–5)
Lymphocytes Relative: 30 % (ref 12–46)
Lymphs Abs: 1.7 10*3/uL (ref 0.7–4.0)
Monocytes Absolute: 0.5 10*3/uL (ref 0.1–1.0)

## 2010-04-23 LAB — COMPREHENSIVE METABOLIC PANEL
ALT: 22 U/L (ref 0–53)
AST: 18 U/L (ref 0–37)
Albumin: 4 g/dL (ref 3.5–5.2)
CO2: 27 mEq/L (ref 19–32)
Calcium: 9 mg/dL (ref 8.4–10.5)
Chloride: 107 mEq/L (ref 96–112)
Creatinine, Ser: 1.26 mg/dL (ref 0.4–1.5)
GFR calc Af Amer: >60 mL/min (ref >60)
GFR calc non Af Amer: 58 mL/min — ABNORMAL LOW (ref >60)
Sodium: 141 mEq/L (ref 135–145)

## 2010-04-23 LAB — URINE MICROSCOPIC-ADD ON

## 2010-05-16 LAB — GLUCOSE, CAPILLARY: Glucose-Capillary: 111 mg/dL — ABNORMAL HIGH (ref 70–99)

## 2010-06-03 ENCOUNTER — Encounter (HOSPITAL_BASED_OUTPATIENT_CLINIC_OR_DEPARTMENT_OTHER): Payer: Managed Care, Other (non HMO) | Admitting: Oncology

## 2010-06-03 DIAGNOSIS — Z85528 Personal history of other malignant neoplasm of kidney: Secondary | ICD-10-CM

## 2010-06-03 DIAGNOSIS — R1909 Other intra-abdominal and pelvic swelling, mass and lump: Secondary | ICD-10-CM

## 2010-06-03 DIAGNOSIS — Z905 Acquired absence of kidney: Secondary | ICD-10-CM

## 2010-06-21 NOTE — Op Note (Signed)
NAME:  Jacob Stone, Jacob Stone NO.:  [id number]   MEDICAL RECORD NO.:  [id number]          PATIENT TYPE:  AMB   LOCATION:  [facility]                         FACILITY:  [facility]   PHYSICIAN:  Mark C. Vernie Ammons, M.D.  DATE OF BIRTH:  05/14/1950   DATE OF PROCEDURE:  07/11/2007  DATE OF DISCHARGE:                               OPERATIVE REPORT   PREOPERATIVE DIAGNOSIS:  Right hydrocele.   POSTOPERATIVE DIAGNOSIS:  Right hydrocele.   PROCEDURE:  Right hydrocelectomy.   SURGEON:  Mark C. Vernie Ammons, M.D.   ANESTHESIA:  General.   BLOOD LOSS:  Minimal.   DRAINS:  None.   SPECIMENS:  None.   COMPLICATIONS:  None.   INDICATIONS:  The patient is a 60 year old male with a large tense right  hydrocele.  It has increased in size over time.  It has become not only  a cosmetic nuisance, but it is interfering with his activities of daily  living and we have discussed treatment options.  He understands the  risks, complications, and alternatives of hydrocelectomy and has elected  to proceed with this.   DESCRIPTION OF OPERATION:  After informed consent, the patient was  brought to the major OR, placed on the table, and administered general  anesthesia in the supine position.  His genitalia was sterilely prepped  and draped, and an official time-out was then performed.   A midline median raphe incision was then made, and the tissue overlying  the hydrocele on the right-hand side was then cleared using a  combination of sharp and blunt technique.  The hydrocele was then  developed through the scrotal incision, and the remaining surrounding  tissue was removed from the hydrocele sac and the sac opened, drained of  clear amber fluid, and then incised on the anterior surface proximally  and distally.  This allowed the hydrocele sac then to be opened and  inverted behind the testicle.  The edges were then cauterized and then  reapproximated with running locking 3-0 chromic suture.  I then  replaced  the testicle in the right hemiscrotum, the wound was irrigated with  saline, and the deep scrotal tissue was then reapproximated with running  3-0 chromic suture.  0.5% plain Marcaine was then used to infiltrate the  subcutaneous tissue circumferentially around the incision, and the skin  edges of the incision were then reapproximated with running 3-0 chromic  suture.  Neosporin, sterile 4 x 4s, fluffed Kerlix, and a scrotal  support were then applied.   The patient was awakened and taken to the recovery room in stable and  satisfactory condition.  He tolerated the procedure well.  There were no  intraoperative complications.   He will be given a prescription for 38 Tylox and to stay on Keflex 500  mg t.i.d. for 5 days.  He will follow up in my office in 4 weeks and  sooner should he have any difficulty.  He was given written instructions  of the time of discharge.      Mark C. Vernie Ammons, M.D.  Electronically Signed     MCO/MEDQ  D:  07/11/2007  T:  07/11/2007  Job:  [id number]

## 2010-06-24 NOTE — Assessment & Plan Note (Signed)
[facility]                                   ON-CALL NOTE   Jacob, [name]                       MRN:          [id number]  DATE:09/24/2005                            DOB:          15-Dec-1950    Patient of Dr. Tawanna Cooler.   Blood pressure is elevated.  He is not feeling well.  It is running 180/110  and he is not sure why.  He has taken his medicines.  Advised to rest at  home today, double up on his ACE inhibitor, come to the office 8:15 in the  morning for eval.                                   Tinnie Gens A. Tawanna Cooler, MD   JAT/MedQ  DD:  09/24/2005  DT:  09/24/2005  Job #:  [id number]

## 2010-06-24 NOTE — Discharge Summary (Signed)
NAME:  Jacob [name], Jacob [name] NO.:  [id number]   MEDICAL RECORD NO.:  [id number]                   PATIENT TYPE:  INP   LOCATION:  0379                                 FACILITY:  [facility]   PHYSICIAN:  Mark C. Vernie Ammons, M.D.               DATE OF BIRTH:  06-14-1950   DATE OF ADMISSION:  07/29/2003  DATE OF DISCHARGE:  08/01/2003                                 DISCHARGE SUMMARY   PRINCIPAL DIAGNOSIS:  Right renal cell carcinoma.   OTHER DIAGNOSES:  1. History of diverticulosis.  2. Hypertension.  3. Urolithiasis.   MAJOR OPERATION:  Right radical nephrectomy and cystoscopy.   DISPOSITION:  The patient is discharged home in stable satisfactory and  improved condition.  He has tolerated a regular diet.  His wound is healing  well with no sign of infection.  His diet will be unrestricted and he will  follow up in my office in 1 week for skin staple removal.  His activity will  be limited to no heavy lifting, straining, or vigorous activity.   DISCHARGE MEDICATIONS:  Vicodin 1-2 q.4h. p.r.n. pain.   BRIEF HISTORY:  The patient is a 60 year old white male who was having lower  abdominal pain prompting a CT scan of the abdomen which revealed a solid,  enhancing, 3.6 x 3.2 cm mass in the lower pole of the right kidney.  There  was also findings consistent with suspected diverticulitis for which he was  admitted.  His diverticulitis was treated and that resolved.  He was  readmitted at this time for elective right radical nephrectomy based on CT  criteria of a solid enhancing mass in his right kidney.  We discussed the  risks, complications,  alternatives, and limitations to this approach.  The  remainder of his history and physical was previously dictated and is noted  in his hospital chart and will not be re-dictated here.   HOSPITAL COURSE:  On July 29, 2003, the patient underwent a right radical  nephrectomy.  Intraoperatively, the frozen section of the lesion  revealed it  was renal cell carcinoma.  Because of his hematuria, a cystoscopy was also  performed and found to be negative.  The night of his surgery he was doing  well.  He was using PCA for pain control and his urine remained clear.  The  following day, he began ambulating.  His hemoglobin was noted to be 13.0 and  creatinine of 1.6.  His diet was advanced to regular.  Pulmonary toilet was  encouraged and by his second postoperative day, he was voiding without any  difficulty.  He said he still felt weak.  He had a maximum temperature of  101.0, his incision had no sign of infection, and his pathology returned PT  1A clear cell grade 2-4 which was discussed with the patient.  He was felt  possibly ready for discharge, but became  nauseated that day.  He had no had  a bowel movement, so he was given antiemetics and laxatives.  His nausea  resolved.  He was feeling much better.  He was having normal bowel sounds  and felt ready for discharge on his third postoperative day with the above  instruction.                                              Mark C. Vernie Ammons, M.D.   MCO/MEDQ  D:  09/04/2003  T:  09/04/2003  Job:  [id number]

## 2010-06-24 NOTE — Consult Note (Signed)
NAME:  MARCELO, Jacob [name] NO.:  [id number]   MEDICAL RECORD NO.:  [id number]                   PATIENT TYPE:  INP   LOCATION:  0483                                 FACILITY:  [facility]   PHYSICIAN:  Mark C. Vernie Ammons, M.D.               DATE OF BIRTH:  1950/08/22   DATE OF CONSULTATION:  07/24/2003  DATE OF DISCHARGE:                                   CONSULTATION   ADDENDUM:  [name], his wife, and I spent a great deal of time together  this afternoon, discussing the fact that he has an approximately 3.5 cm  solid mass in the lower pole of his right kidney.  It impinges on the  collecting system, and he has had a single episode of gross hematuria  several months ago.  I told them that this is undoubtedly some form of  neoplasm and that biopsy is not typically indicated, as a negative biopsy  does not rule out malignancy.  Because of that, I have recommended surgical  therapy.  The lesion is of a size and position that partial nephrectomy is  not an option; therefore, he would need to undergo a right radical  nephrectomy.  He appears to have normal renal function with a creatinine of  1.2 and normal-appearing left kidney with normal function by CT scan.  We  discussed that there is a remote possibility, because of the hematuria, that  it could be transitional cell carcinoma, which would necessitate a distal  ureterectomy as well.  There is also an outside chance that this could be a  benign tumor such as an oncocytoma.   I discussed the incision that would be used in the anticipated hospital  course and the risks and complications, including but not limited to  bleeding, infection, anesthetic risk, DVT, MI, PE, etc.  We also discussed  injury to surrounding organs such as the bowels, liver, pancreas, and  pleura.  We discussed possible need for chest tube due to pneumothorax.   He and his wife are very anxious and would like to proceed with surgical  therapy  as soon as possible.  My concern is that he appears to have a mild  acute diverticulitis, and I would like to have him over that to some degree.  We have decided, therefore, to allow his current abdominal pain to be  treated for the next several days and plan to get him on the surgical  schedule some time next week.                                               Mark C. Vernie Ammons, M.D.    MCO/MEDQ  D:  07/24/2003  T:  07/25/2003  Job:  [id number]

## 2010-06-24 NOTE — Op Note (Signed)
NAME:  Jacob [name], VALLONE NO.:  [id number]   MEDICAL RECORD NO.:  [id number]                   PATIENT TYPE:  INP   LOCATION:  0379                                 FACILITY:  [facility]   PHYSICIAN:  Mark C. Vernie Ammons, M.D.               DATE OF BIRTH:  [birth year]   DATE OF PROCEDURE:  07/29/2003  DATE OF DISCHARGE:                                 OPERATIVE REPORT   PREOPERATIVE DIAGNOSES:  Right kidney tumor (transitional cell carcinoma  versus renal cell carcinoma).   POSTOPERATIVE DIAGNOSES:  Right renal tumor-renal cell carcinoma, clear cell  type.   PROCEDURE:  Cystoscopy, right radical nephrectomy.   ATTENDING SURGEON:  Mark C. Vernie Ammons, M.D.   RESIDENT SURGEON:  Rhae Lerner, M.D.   ANESTHESIA:  General endotracheal anesthesia.   COMPLICATIONS:  None.   ESTIMATED BLOOD LOSS:  300 mL.   INDICATIONS FOR PROCEDURE:  Mr. Godown is a 60 year old white male who  presented without [facility] on July 24, 2003 with the  complaint of severe lower abdominal pain along with nausea and fevers up to  101.  On evaluation, a CT scan of the abdomen and pelvis was performed which  demonstrated an incidental 3.6 x 3.2 cm mass in the lower pole of the right  kidney.  Once Mr. Revelle had recovered from his episode of acute  diverticulitis, an in depth discussion was held with he and his wife  concerning the natural history of kidney tumors as well as the options for  treatment and the risks inherent to such a procedure.  After answering all  of the questions, the patient has elected to proceed with right radical  nephrectomy.  Due to his history of gross hematuria, we will plan to perform  cystoscopic evaluation of his bladder prior to the open procedure to rule  out a bladder tumor and we will send the specimen for frozen at the time of  surgery to confirm that the tumor is a renal cell carcinoma.   DESCRIPTION OF PROCEDURE:  Mr. Kuzel was  brought to the operating room and  following induction of general endotracheal anesthesia was initially placed  in the dorsal lithotomy position and prepped and draped in the usual sterile  fashion.  A 17 French flexible cystoscope was subsequently introduced to the  patient's urethra into the bladder.  On passage through the urethra, the  urethral mucosa and the prostatic urethra appear to be within normal limits  and free of any signs of tumor.  Once entering the bladder both right and  left ureteral orifices were identified and efflux of clear urine confirmed  from each. The remainder of the bladder trigone appeared to be within normal  limits with no signs of tumor, stone, diverticulum or other abnormality.  A  systematic examination of the entire bladder mucosa was subsequently  performed and again there appeared to  be no signs of tumor, stone,  diverticulum or other abnormality present.  The cystoscope was subsequently  removed and a 16 French Foley catheter placed to straight drain. At this  point, the patient was repositioned in a right flank position and again  prepped and draped in the usual sterile fashion.  A subcostal incision was  subsequently performed from the tip of the twelfth rib to a point along the  lateral surface of the rectus muscle.  Dissection was carried down through  the external and internal oblique muscles and their fascia.  The  transversalis muscle and its fascia were subsequently divided and the  retroperitoneal space entered.  The space between the posterior leaf of  Gerota's fascia and the psoas muscle was subsequently developed on the right  side using blunt dissection with a Kitner as well as a sponge stick.  The  division between the peritoneum and the anterior leaf of Gerota's fascia was  subsequently identified and again a plane developed between these two layers  using careful blunt dissection.  The dissection was subsequently carried  inferiorly  until the ureter was identified.  The ureter was then doubly  clipped as well as ligated with a #0 silk and then divided.  In the process  of releasing all attachments of the right kidney inferiorly, the right  gonadal vein was identified and ligated with 2-0 silks as well as a clip and  then divided. At this point, dissection was carried superiorly along the  inferior vena cava until the renal hilum was identified.  Initially a small  accessory renal artery was identified inferiorly, doubly ligated with #0  silk and divided.  The right renal vein was next identified. During  skeletonization of the right renal vein, a small tear occurred and a lumbar  vein directly posterior to the renal vein.  Two #0 silk sutures were quickly  passed to identify the right renal vein and then the renal artery quickly  identified and tied off near the aorta using a #0 silk tie.  Attention was  then returned to the right renal vein which was then doubly ligated with the  previously placed #0 silks clipped and divided.  A second tie was  subsequently applied to the renal artery along with a large clip and then it  too was divided.  The right kidney was subsequently released from all  remaining posterior attachments.  At this point, the only remaining tissues  adherent to the right kidney with its investing at Gerota's fascia were  located superiorly.  These last few attachments were subsequently divided  using a combination of blunt dissection, Bovie cautery and sharp dissection  with Metzenbaum scissors.  Note that during dissection of the superior pole  of the right kidney, care was taken to leave the right adrenal gland in its  appropriate anatomical location and intact.  The specimen was subsequently  removed from the body and sent for pathologic evaluation.  Hemostasis was  subsequently obtained using Bovie cautery as well as a few small clips on some bleeding vessels within the intraabdominal fat.  A  small piece of  Surgicel was also placed on the superior portion of the right adrenal gland  as there was a small amount of oozing from a tiny laceration likely caused  during blunt dissection of the superior pole of the kidney.  Once hemostasis  had been obtained, all lap sponges were removed, the wound irrigated and  closure initiated.  The wound  was closed by reapproximating both the  internal oblique fascia followed by the external oblique fascia with #1 PDS  in a running fashion.  The skin was subsequently reapproximated using skin  staples.  Prior to leaving the operating room, pathology called with the  results of the frozen section which demonstrated renal cell carcinoma, clear  cell type. At this point, the patient was allowed to awaken and the case was  ended. The patient tolerated the procedure well and there were no  complications.  Please note that Mark C. Vernie Ammons, M.D. was present for the  entire case and participated in all aspects of the procedure.     [name], MD                        [name]. Vernie Ammons, M.D.    JP/MEDQ  D:  07/29/2003  T:  07/29/2003  Job:  [id number]

## 2010-06-24 NOTE — Discharge Summary (Signed)
NAME:  [name], [name] NO.:  [id number]   MEDICAL RECORD NO.:  [id number]                   PATIENT TYPE:  INP   LOCATION:  0483                                 FACILITY:  [facility]   PHYSICIAN:  Rosalyn Gess. Norins, M.D. [facility]         DATE OF BIRTH:  July 31, 1950   DATE OF ADMISSION:  07/24/2003  DATE OF DISCHARGE:  07/26/2003                                 DISCHARGE SUMMARY   ADMISSION DIAGNOSES:  1. Diverticulitis.  2. Renal mass.   DISCHARGE DIAGNOSES:  1. Diverticulitis.  2. Renal mass, probably renal cell carcinoma.   CONSULTANTS:  Mark C. Vernie Ammons, M.D.   PROCEDURE:  CT scan of the abdomen which revealed a 3.7 x 3.3 x 3.5 cm  hypovascular in the right kidney compatible with renal cell carcinoma, a 6  mm calculus in the lower pole of right kidney without hydronephrosis.  Moderate inflammatory changes secondary to sigmoid diverticulitis with  evidence of foci of extraluminal gas adjacent to the sigmoid colon, a mild  amount of free pelvic fluid without definite abscess.  Abdominal film  performed on July 25, 2003, showed contrast without nondistended colon,  compatible with oral contrast with no sign of abscess.   HISTORY OF PRESENT ILLNESS:  [name] is a very pleasant 60 year old patient  who presented to the emergency department with progressive lower abdominal  pain. Evaluation did reveal the patient to have diverticulitis by CT  criteria, fever, and leukocytosis. The patient also had an incidental  finding of a right renal mass.   Past medical history, family history, social history, and physical exam, per  the admitting note.   HOSPITAL COURSE:   PROBLEM #1:  GI/ID. The patient was admitted with diverticulitis and started  on Unasyn 1.5 gm IV q.6h. The patient quickly defervesced. His leukocytosis  resolved. His lower abdominal pain and discomfort was markedly improved. The  patient's pain problem was constipation. On the day of discharge,  with his  white count being normal with no fever, it was felt safe and reasonable to  switch him to oral antibiotics for completion of outpatient therapy. For  constipation, he will be treated with milk of magnesia.   PROBLEM #2:  GU. Patient with renal mass. He was previously seen in  consultation by Dr. Vernie Ammons who agreed this was probably a renal cell  carcinoma. Dr. Vernie Ammons discussed this with the patient and his wife, and the  patient is for radical nephrectomy to be scheduled in the next seven days.  He will be re-admitted for this procedure.   DISCHARGE EXAMINATION:  VITAL SIGNS: Temperature 98.1, blood pressure  128/86, pulse 59, respirations 18, and O2 saturation 97% on room air.  ABDOMEN: The patient had positive bowel sounds. There was no significant  tenderness to palpation.   DISCHARGE MEDICATIONS:  Augmentin 875 mg b.i.d. for seven days. The patient  will otherwise continue on his home medications. He will  use milk of  magnesia 30 cc p.o. q.4h. until bowel movement or four doses, and to call if  he does not have resolution of his constipation.   The patient's condition at the time of discharge dictation is stable. He  does fully understand his instructions and also notes he will be contacted  by Dr. Margrett Rud office in regards to surgical scheduling and re-admission.                                               Rosalyn Gess Norins, M.D. [facility]    MEN/MEDQ  D:  07/26/2003  T:  07/27/2003  Job:  [id number]   cc:   [name]. Vernie Ammons, M.D.  [address]  [place]  [place] [address]  Fax: [phone]   Eugenio Hoes. Tawanna Cooler, M.D. [facility]

## 2010-06-24 NOTE — H&P (Signed)
NAME:  Jacob [name], Jacob [name] NO.:  [id number]   MEDICAL RECORD NO.:  [id number]                   PATIENT TYPE:  INP   LOCATION:  0483                                 FACILITY:  [facility]   PHYSICIAN:  Rosalyn Gess. Norins, M.D. [facility]         DATE OF BIRTH:  06/06/1950   DATE OF ADMISSION:  07/24/2003  DATE OF DISCHARGE:                                HISTORY & PHYSICAL   CHIEF COMPLAINT:  Abdominal pain.   HISTORY OF PRESENT ILLNESS:  Mr. Charters is a 60 year old Caucasian male  followed by Dr. Alonza Smoker, who reports the onset on June 16 of lower  abdominal pain.  This persisted throughout the night, and today it became  progressively worse.  He developed nausea and vomiting.  Because of ongoing  pain, he presented to the emergency department, where he was found to have  low-grade fever.  He did have rigors and ongoing abdominal pain.  Initial  evaluation revealed a leukocytosis with CT evidence of diverticulitis.  Patient had an incidental finding of 3.6 x 3.2 lower pole mass of the right  kidney.  Patient is now admitted for IV antibiotics for diverticulitis and a  GU consult with possible nephrectomy.   PAST SURGICAL HISTORY:  Tonsillectomy.   PAST MEDICAL HISTORY:  Patient had the usual childhood diseases.  He has had  a history of nephrolithiasis with spontaneous passage of stone five years  ago.  A history of hypertension.  A history of allergy.   CURRENT MEDICATIONS:  1. Zyrtec.  2. Lisinopril 10 mg q.d.   HABITS:  Tobacco:  None.  Alcohol:  One ounce per week.   He has no known drug allergies.   FAMILY HISTORY:  Father died at age 47 of Wegener's granulomatosis and  hypertension.  Mother is living.  She is status post bypass surgery.  She  has a history of hypertension.  She recently had a CVA, leaving her with a  left hemiplegia, and she is nursing-home bound.   SOCIAL HISTORY:  Patient is married.  He has one daughter who has graduated  college.   He has a son who is a Holiday representative at [employer].  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  His marriage is in good health, and his  wife is supportive.   PHYSICAL EXAMINATION:  VITAL SIGNS:  Temperature is 99, blood pressure  118/82, pulse 86, respirations 20.  GENERAL:  A well-developed and well-nourished Caucasian male who looks his  stated age in no acute distress.  HEENT:  Normocephalic and atraumatic.  HEENT is otherwise unremarkable.  Oropharynx and native dentition are in good repair.  Conjunctivae and  sclerae are clear.  Pupils are equal, round and reactive to light and  accommodation.  NECK:  Supple without thyromegaly.  NODES:  No adenopathy is noted in the cervical, supraclavicular, or inguinal  regions.  CHEST:  With CVA tenderness.  LUNGS:  Clear with no rales, rhonchi or wheezes.  HEART:  Radial pulses 2+.  No JVD or carotid bruits.  Has a regular rate and  rhythm without murmurs, rubs or gallops.  ABDOMEN:  Soft with no guarding or rebound.  He had hypoactive bowel sounds.  He had fullness in the right quadrant with mild tenderness.  He has  significant tenderness in the lower quadrants to both percussion and  palpation.  RECTAL:  Per the EDP revealed a normal prostate.  EXTREMITIES:  No cyanosis, clubbing, edema, or deformity.  NEUROLOGIC:  Nonfocal.  SKIN:  Patient has a birthmark/mole in his right lower extremity which has  been stable.  He has a cherry angioma, which is approximately 4 mm in  diameter, on his chest midline.   LABORATORY:  UA was negative.  Sodium 140, potassium 4.6, chloride 105, CO2  27, BUN 13, creatinine 1.2, glucose 110.  LFTs were normal.  Amylase was 66.  Hemoglobin is 15.7 gm.  White count was 12,600 with 80% segs, 10% lymphs, 7%  monos.  Platelet count 289,000.   CT scan by preliminary report revealed left colon wall thickening with  stranding.  Minimal free air, which is questionable.  Patient does have a  3.6 x 3.2 lower pole  right renal mass.   ASSESSMENT/PLAN:  1. Gastrointestinal/infectious disease:  Patient with diverticulitis on exam     by CT and with elevated white count.  There is no evidence for abscess or     perforation.  Regular admission.  Patient will be started on Unasyn 1.5     gm IV q.6h.  We will put him on a full-liquid diet and advance as     tolerated.  We will get a follow-up abdominal film in the a.m.  2. Genitourinary:  Patient with a new lower pole renal mass.  Very     suggestive for renal cell carcinoma.  Patient with a history remotely of     nephrolithiasis with a question of stone in the right renal pelvis.  GU     consult called.  Suspect the patient may come to nephrectomy.  3. Hypertension:  Patient's blood pressure is well controlled and stable.     He will be continued on his home medications.                                               Rosalyn Gess Norins, M.D. [facility]    MEN/MEDQ  D:  07/24/2003  T:  07/24/2003  Job:  [id number]   cc:   Tinnie Gens A. Tawanna Cooler, M.D. [facility]

## 2010-06-24 NOTE — Consult Note (Signed)
NAME:  Jacob, [name] NO.:  [id number]   MEDICAL RECORD NO.:  [id number]                   PATIENT TYPE:  INP   LOCATION:  0483                                 FACILITY:  [facility]   PHYSICIAN:  Mark C. Vernie Ammons, M.D.               DATE OF BIRTH:  Jun 28, 1950   DATE OF CONSULTATION:  07/24/2003  DATE OF DISCHARGE:                                   CONSULTATION   REASON FOR CONSULTATION:  Right kidney tumor.   HISTORY OF PRESENT ILLNESS:  [name] is a 60 year old white male who  presented to [facility] emergency department this morning complaining of  severe lower abdominal pain, nausea without vomiting, and fevers up to 101.  On evaluation, a CT scan of the abdomen and the pelvis was performed, which  demonstrated a solid, enhancing 3.6 x 3.2 cm mass in the lower pole of the  right kidney.  The scan also demonstrated GI findings consistent with  suspected diverticulitis for which he is being admitted for further  treatment.  Mr. [name] reports a history of nephrolithiasis, stating that he  has always been able to pass the stones spontaneously and has never seen a  urologist.  The patient also reports one episode of a simple lower urinary  tract infection treated with antibiotics.  [name] reports a history of  gross hematuria not associated with pain.  The last episode occurred  approximately four months ago.  The urology service was contacted to  evaluate the patient secondary to the incidentally discovered right renal  lesion.   REVIEW OF SYSTEMS:  As above, otherwise negative.   PAST MEDICAL HISTORY:  1. Diverticulosis.  2. Hypertension.  3. Urolithiasis.   PAST SURGICAL HISTORY:  Status post tonsillectomy.   ALLERGIES:  No known drug allergies.   FAMILY HISTORY:  Patient denies any history of GU malignancy.   SOCIAL HISTORY:  The patient lives in [place] with his wife.  He denies  tobacco use.  He does state that he drinks an occasional  beer.   MEDICATIONS:  Lisinopril.  Zyrtec.  Vicodin.  Phenergan.  Unasyn.   LABS:  White blood count 12.6, hemoglobin 15.7, hematocrit 45.7, platelet  count 289.  Sodium 140, potassium 4.6, chloride 105, CO2 27, glucose 110,  BUN 13, creatinine 1.2.  Urinalysis demonstrates clear urine which is  negative for blood, nitrites, and leukocytes.   PHYSICAL EXAMINATION:  VITAL SIGNS:  Temperature 99.3, respiratory rate 18,  pulse 102, blood pressure 119/84.  GENERAL:  Patient is in no acute distress.  Alert and oriented to person,  place, and time.  HEENT:  Pupils are equal, round and reactive to light.  Extraocular  movements are intact.  NECK:  No masses.  LUNGS:  Clear to auscultation bilaterally.  HEART:  Regular rate and rhythm without murmurs.  ABDOMEN:  Soft.  Moderately tender in the bilateral lower quadrants.  Nondistended.  Bowel sounds positive.  EXTREMITIES:  No clubbing, cyanosis or edema.  SKIN:  No lesions.  GU:  Normal circumcised penis without plaques or masses.  Testes descended  bilaterally with no masses.  Cords within normal limits.   ASSESSMENT/PLAN:  A 60 year old white male with incidentally discovered 3 x  3 cm right kidney mass.  There is no acute indication for urologic  intervention at this time.  [name] should be treated for his current  gastroenterologic condition and _________ stable.  Will need further  evaluation for this right kidney mass.  [name] will need to undergo  cystoscopic evaluation of his bladder secondary to a history of gross  hematuria and will most likely need to be scheduled for a right nephrectomy  once he is recovered from his current illness.  We will have [name] set up  to follow up with [place] in the clinic at the time of his discharge.     [name], MD                        [name]. Vernie Ammons, M.D.    JP/MEDQ  D:  07/24/2003  T:  07/25/2003  Job:  [id number]

## 2010-07-22 ENCOUNTER — Encounter: Payer: Self-pay | Admitting: Internal Medicine

## 2010-07-22 ENCOUNTER — Ambulatory Visit (INDEPENDENT_AMBULATORY_CARE_PROVIDER_SITE_OTHER): Payer: Managed Care, Other (non HMO) | Admitting: Internal Medicine

## 2010-07-22 DIAGNOSIS — T148XXA Other injury of unspecified body region, initial encounter: Secondary | ICD-10-CM

## 2010-07-22 DIAGNOSIS — W57XXXA Bitten or stung by nonvenomous insect and other nonvenomous arthropods, initial encounter: Secondary | ICD-10-CM

## 2010-07-22 DIAGNOSIS — T148 Other injury of unspecified body region: Secondary | ICD-10-CM

## 2010-07-22 DIAGNOSIS — J329 Chronic sinusitis, unspecified: Secondary | ICD-10-CM

## 2010-07-22 MED ORDER — DOXYCYCLINE HYCLATE 100 MG PO TABS: 100.0000 mg | ORAL_TABLET | Freq: Two times a day (BID) | ORAL | Status: AC

## 2010-07-22 NOTE — Assessment & Plan Note (Signed)
Currently asymptomatic without signs and symptoms suggestive of tickborne illness. Monitor for development of the symptoms which were reviewed.

## 2010-07-22 NOTE — Progress Notes (Signed)
  Subjective:    Patient ID: Jacob Stone, male    DOB: Aug 13, 1950, 60 y.o.   MRN: [id number]  HPI Pt presents to clinic for evaluation of nasal congestion and cough. Notes three-week history of sinus pressure, nasal congestion and slight cough. Symptoms have waxed and waned. No known sick exposure. Attempted Tylenol when necessary. No alleviating or exacerbating factors. Also several weeks ago had confirmed left leg tick bite. No associated rash, photophobia, fever or headaches. No other complaints.  Reviewed past medical history, medications and allergies.  Review of Systems  Constitutional: Negative for fever, chills and fatigue.  HENT: Positive for congestion, sore throat and rhinorrhea.   Respiratory: Positive for cough. Negative for shortness of breath and wheezing.   Cardiovascular: Negative for chest pain.  Skin: Negative for color change, pallor and rash.       Objective:   Physical Exam    Physical Exam  [nursing notereviewed. Constitutional:  appears well-developed and well-nourished. No distress.  HENT:  Head: Normocephalic and atraumatic.  Right Ear: Tympanic membrane, external ear and ear canal normal.  Left Ear: Tympanic membrane, external ear and ear canal normal.  Nose: Nose normal.  Mouth/Throat: Oropharynx is clear and moist. No oropharyngeal exudate.  Eyes: Conjunctivae are normal. No scleral icterus.  Neck: Neck supple.  Cardiovascular: Normal rate, regular rhythm and normal heart sounds.  Exam reveals no gallop and no friction rub.   No murmur heard. Pulmonary/Chest: Effort normal and breath sounds normal. No respiratory distress.  no wheezes.  no rales.  Lymphadenopathy:     no cervical adenopathy.  Neurological:  alert.  Skin: Skin is warm and dry.  not diaphoretic.      Assessment & Plan:

## 2010-07-22 NOTE — Assessment & Plan Note (Signed)
Begin doxycycline under milligrams twice a day x10 days.Followup if no improvement or worsening.

## 2010-08-02 ENCOUNTER — Other Ambulatory Visit (INDEPENDENT_AMBULATORY_CARE_PROVIDER_SITE_OTHER): Payer: Managed Care, Other (non HMO)

## 2010-08-02 DIAGNOSIS — Z0000 Encounter for general adult medical examination without abnormal findings: Secondary | ICD-10-CM

## 2010-08-02 LAB — CBC WITH DIFFERENTIAL/PLATELET
Basophils Relative: 0.5 % (ref 0.0–3.0)
Eosinophils Relative: 5.8 % — ABNORMAL HIGH (ref 0.0–5.0)
HCT: 45.4 % (ref 39.0–52.0)
Lymphs Abs: 2.6 10*3/uL (ref 0.7–4.0)
MCV: 92.3 fl (ref 78.0–100.0)
Monocytes Absolute: 0.6 10*3/uL (ref 0.1–1.0)
Neutro Abs: 2.8 10*3/uL (ref 1.4–7.7)
RBC: 4.92 Mil/uL (ref 4.22–5.81)
WBC: 6.3 10*3/uL (ref 4.5–10.5)

## 2010-08-02 LAB — POCT URINALYSIS DIPSTICK
Bilirubin, UA: NEGATIVE
Ketones, UA: NEGATIVE
Leukocytes, UA: NEGATIVE
pH, UA: 5

## 2010-08-02 LAB — HEPATIC FUNCTION PANEL
ALT: 25 U/L (ref 0–53)
Total Protein: 7.4 g/dL (ref 6.0–8.3)

## 2010-08-02 LAB — LIPID PANEL
Cholesterol: 177 mg/dL (ref 0–200)
Triglycerides: 248 mg/dL — ABNORMAL HIGH (ref 0.0–149.0)

## 2010-08-02 LAB — BASIC METABOLIC PANEL
Chloride: 107 mEq/L (ref 96–112)
Potassium: 5.2 mEq/L — ABNORMAL HIGH (ref 3.5–5.1)

## 2010-08-03 LAB — LDL CHOLESTEROL, DIRECT: Direct LDL: 93.1 mg/dL

## 2010-08-04 ENCOUNTER — Other Ambulatory Visit (HOSPITAL_COMMUNITY): Payer: Self-pay | Admitting: Urology

## 2010-08-04 ENCOUNTER — Ambulatory Visit (HOSPITAL_COMMUNITY)
Admission: RE | Admit: 2010-08-04 | Discharge: 2010-08-04 | Disposition: A | Discharge status: Home / Self Care | Payer: Managed Care, Other (non HMO) | Source: Ambulatory Visit | Attending: Urology | Admitting: Urology

## 2010-08-04 DIAGNOSIS — I10 Essential (primary) hypertension: Secondary | ICD-10-CM | POA: Insufficient documentation

## 2010-08-04 DIAGNOSIS — C649 Malignant neoplasm of unspecified kidney, except renal pelvis: Secondary | ICD-10-CM | POA: Insufficient documentation

## 2010-08-04 DIAGNOSIS — Z85528 Personal history of other malignant neoplasm of kidney: Secondary | ICD-10-CM

## 2010-08-04 DIAGNOSIS — Z79899 Other long term (current) drug therapy: Secondary | ICD-10-CM | POA: Insufficient documentation

## 2010-08-08 ENCOUNTER — Encounter: Payer: Self-pay | Admitting: Family Medicine

## 2010-08-08 ENCOUNTER — Ambulatory Visit (INDEPENDENT_AMBULATORY_CARE_PROVIDER_SITE_OTHER): Payer: Managed Care, Other (non HMO) | Admitting: Family Medicine

## 2010-08-08 DIAGNOSIS — IMO0002 Reserved for concepts with insufficient information to code with codable children: Secondary | ICD-10-CM

## 2010-08-08 DIAGNOSIS — J309 Allergic rhinitis, unspecified: Secondary | ICD-10-CM

## 2010-08-08 DIAGNOSIS — C649 Malignant neoplasm of unspecified kidney, except renal pelvis: Secondary | ICD-10-CM

## 2010-08-08 MED ORDER — AMLODIPINE BESYLATE 5 MG PO TABS: 5.0000 mg | ORAL_TABLET | ORAL | Status: DC

## 2010-08-08 NOTE — Patient Instructions (Signed)
Continue your current medications.  Follow-up in one year, sooner if any problems 

## 2010-08-08 NOTE — Progress Notes (Signed)
  Subjective:    Patient ID: Jacob Stone, male    DOB: [birth year], 60 y.o.   MRN: [id number]  HPI   Jacob Stone is a 60 year old, married male, nonsmoker, who comes in today for general physical examination for evaluation of hypertension, allergic rhinitis, and history of diverticulitis in kidney cancer.  Is a long-standing history of hypertension.  He is currently on Norvasc 5 mg daily.  BP 130/90 at home is 130/84.  He takes Zyrtec 10 mg nightly for allergic rhinitis.  Last year.  He had a severe bout of diverticulitis.  Scan showed a spot on his right kidney.  Subsequent turn out to be a renal cell carcinoma.  The diverticulitis, resolved, then the kidney was removed by Dr. Wynelle Link .  He's done well with no complications.  Past showed no evidence of extension beyond the capsule of the kidney.  He gets routine eye care, dental care, colonoscopy, 2010, tetanus, 2004, information given on shingles    Review of Systems  Constitutional: Negative.   HENT: Negative.   Eyes: Negative.   Respiratory: Negative.   Cardiovascular: Negative.   Gastrointestinal: Negative.   Genitourinary: Negative.   Musculoskeletal: Negative.   Skin: Negative.   Neurological: Negative.   Hematological: Negative.   Psychiatric/Behavioral: Negative.        Objective:   Physical Exam  Constitutional: He is oriented to person, place, and time. He appears well-developed and well-nourished.  HENT:  Head: Normocephalic and atraumatic.  Right Ear: External ear normal.  Left Ear: External ear normal.  Nose: Nose normal.  Mouth/Throat: Oropharynx is clear and moist.  Eyes: Conjunctivae and EOM are normal. Pupils are equal, round, and reactive to light.  Neck: Normal range of motion. Neck supple. No JVD present. No tracheal deviation present. No thyromegaly present.  Cardiovascular: Normal rate, regular rhythm, normal heart sounds and intact distal pulses.  Exam reveals no gallop and no friction rub.   No murmur  heard. Pulmonary/Chest: Effort normal and breath sounds normal. No stridor. No respiratory distress. He has no wheezes. He has no rales. He exhibits no tenderness.  Abdominal: Soft. Bowel sounds are normal. He exhibits no distension and no mass. There is no tenderness. There is no rebound and no guarding.  Genitourinary: Rectum normal, prostate normal and penis normal. Guaiac negative stool. No penile tenderness.  Musculoskeletal: Normal range of motion. He exhibits no edema and no tenderness.  Lymphadenopathy:    He has no cervical adenopathy.  Neurological: He is alert and oriented to person, place, and time. He has normal reflexes. No cranial nerve deficit. He exhibits normal muscle tone.  Skin: Skin is warm and dry. No rash noted. No erythema. No pallor.       The scar, and the nose from previous Mohs surgery also, a scar in the right flank from nephrectomy and midline from diverticulitis  Psychiatric: He has a normal mood and affect. His behavior is normal. Judgment and thought content normal.          Assessment & Plan:  Healthy male.  Hypertension.  Continue Norvasc 5 daily, and an aspirin tablet.  Allergic rhinitis.  Continue Zyrtec 10 mg daily.  Diverticulosis.  Continue stool softener.  History of right renal cell carcinoma asymptomatic.  Follow-up by Dr. Wynelle Link

## 2010-11-03 LAB — POCT I-STAT, CHEM 8
BUN: 19
Calcium, Ion: 1.21
Chloride: 107
Creatinine, Ser: 1.4
Glucose, Bld: 101 — ABNORMAL HIGH
HCT: 47
Hemoglobin: 16
Potassium: 4.4
Sodium: 142
TCO2: 24

## 2010-11-18 ENCOUNTER — Ambulatory Visit (INDEPENDENT_AMBULATORY_CARE_PROVIDER_SITE_OTHER): Payer: Managed Care, Other (non HMO)

## 2010-11-18 DIAGNOSIS — Z23 Encounter for immunization: Secondary | ICD-10-CM

## 2011-01-09 ENCOUNTER — Telehealth: Payer: Self-pay

## 2011-01-09 NOTE — Telephone Encounter (Signed)
no
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
## 2011-01-09 NOTE — Telephone Encounter (Signed)
Left message on machine for patient

## 2011-01-17 ENCOUNTER — Encounter (INDEPENDENT_AMBULATORY_CARE_PROVIDER_SITE_OTHER): Payer: Self-pay

## 2011-01-25 ENCOUNTER — Other Ambulatory Visit: Payer: Self-pay | Admitting: Orthopaedic Surgery

## 2011-01-25 DIAGNOSIS — M7989 Other specified soft tissue disorders: Secondary | ICD-10-CM

## 2011-01-26 ENCOUNTER — Ambulatory Visit
Admission: RE | Admit: 2011-01-26 | Discharge: 2011-01-26 | Disposition: A | Discharge status: Home / Self Care | Payer: Managed Care, Other (non HMO) | Source: Ambulatory Visit | Attending: Orthopaedic Surgery | Admitting: Orthopaedic Surgery

## 2011-01-26 DIAGNOSIS — M7989 Other specified soft tissue disorders: Secondary | ICD-10-CM

## 2011-02-04 ENCOUNTER — Telehealth: Payer: Self-pay | Admitting: Oncology

## 2011-02-04 NOTE — Telephone Encounter (Signed)
Called pt,left message on cell phone and home number ,MD visit 03/23/11

## 2011-02-10 ENCOUNTER — Telehealth: Payer: Self-pay | Admitting: Oncology

## 2011-02-10 NOTE — Telephone Encounter (Signed)
Called pt , left message  For appt on 03/24/11

## 2011-03-02 ENCOUNTER — Encounter: Payer: Self-pay | Admitting: Family Medicine

## 2011-03-02 ENCOUNTER — Ambulatory Visit (INDEPENDENT_AMBULATORY_CARE_PROVIDER_SITE_OTHER): Payer: Managed Care, Other (non HMO) | Admitting: Family Medicine

## 2011-03-02 VITALS — BP 124/80 | Temp 97.6°F | Wt 194.0 lb

## 2011-03-02 DIAGNOSIS — Z0000 Encounter for general adult medical examination without abnormal findings: Secondary | ICD-10-CM

## 2011-03-02 DIAGNOSIS — Z2911 Encounter for prophylactic immunotherapy for respiratory syncytial virus (RSV): Secondary | ICD-10-CM

## 2011-03-02 DIAGNOSIS — I6529 Occlusion and stenosis of unspecified carotid artery: Secondary | ICD-10-CM | POA: Insufficient documentation

## 2011-03-02 NOTE — Patient Instructions (Signed)
We will set you up for a ultrasound to define the blood flow in your carotid arteries.  Seen. Me  week or so after the study to review the findings
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
## 2011-03-02 NOTE — Progress Notes (Signed)
Addended by: Kern Reap B on: 03/02/2011 12:27 PM   Modules accepted: Orders

## 2011-03-10 DIAGNOSIS — I6529 Occlusion and stenosis of unspecified carotid artery: Secondary | ICD-10-CM

## 2011-03-10 HISTORY — DX: Occlusion and stenosis of unspecified carotid artery: I65.29

## 2011-03-15 ENCOUNTER — Other Ambulatory Visit: Payer: Self-pay | Admitting: *Deleted

## 2011-03-15 DIAGNOSIS — R0989 Other specified symptoms and signs involving the circulatory and respiratory systems: Secondary | ICD-10-CM

## 2011-03-16 ENCOUNTER — Other Ambulatory Visit: Payer: Self-pay | Admitting: *Deleted

## 2011-03-16 ENCOUNTER — Encounter (INDEPENDENT_AMBULATORY_CARE_PROVIDER_SITE_OTHER): Payer: Managed Care, Other (non HMO) | Admitting: *Deleted

## 2011-03-16 DIAGNOSIS — R0989 Other specified symptoms and signs involving the circulatory and respiratory systems: Secondary | ICD-10-CM

## 2011-03-16 DIAGNOSIS — I6529 Occlusion and stenosis of unspecified carotid artery: Secondary | ICD-10-CM

## 2011-03-23 ENCOUNTER — Ambulatory Visit: Payer: Managed Care, Other (non HMO) | Admitting: Oncology

## 2011-03-24 ENCOUNTER — Ambulatory Visit: Payer: Managed Care, Other (non HMO) | Admitting: Oncology

## 2011-04-03 ENCOUNTER — Telehealth: Payer: Self-pay | Admitting: Oncology

## 2011-04-03 NOTE — Telephone Encounter (Signed)
pt called and r/s appt on 02/26 to 07/08

## 2011-04-04 ENCOUNTER — Ambulatory Visit: Payer: Managed Care, Other (non HMO) | Admitting: Oncology

## 2011-06-21 IMAGING — CT CT ABD-PELV W/O CM
2 of 4 series · 13 of 32 positions shown, 18 images · non-contrast
Comparison: 09/15/2009

CLINICAL DATA: Fever, chills, history of recent
surgery/sigmoidectomy for diverticulitis on 08/27/2009, pelvic
abscesses, past history of renal cancer

CT ABDOMEN AND PELVIS WITHOUT CONTRAST
TECHNIQUE: Multidetector CT imaging of the abdomen and pelvis was
performed following the standard protocol without intravenous
contrast. Rectal contrast was utilized.  Oral contrast was not
administered.  Sagittal and coronal MPR images reconstructed from
axial data set.

[Series 2: abdomen w/ · axial · 0.70mm/px · z∈[-372,-32]mm · 7 of 92 slices shown, 12 images]
[im 12/92  soft-tissue]
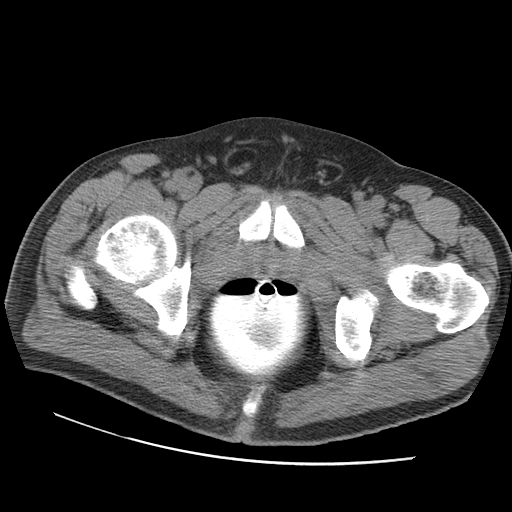
[im 12/92  bone]
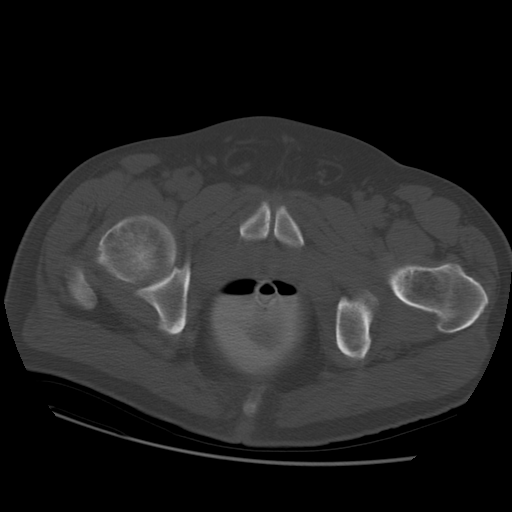
[im 23/92  soft-tissue]
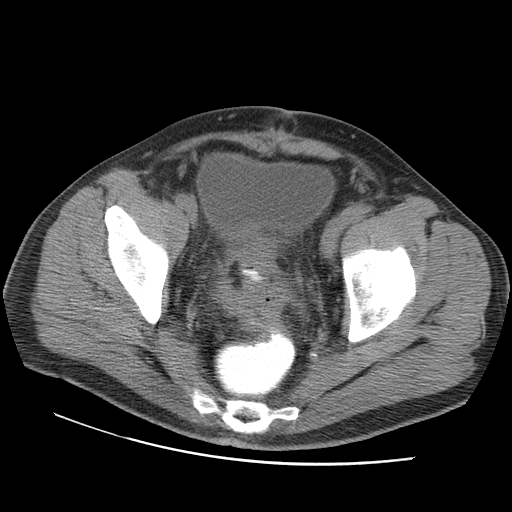
[im 35/92  soft-tissue]
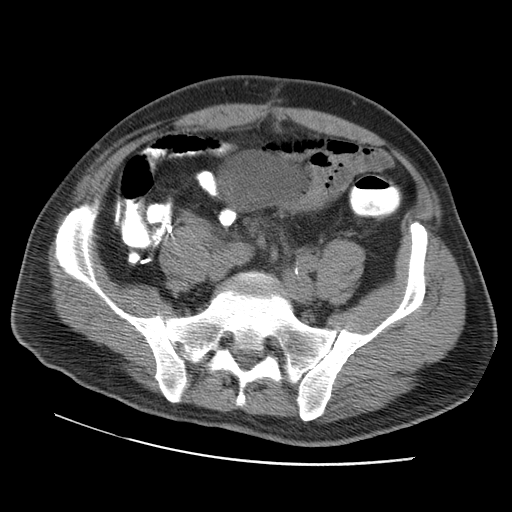
[im 46/92  soft-tissue]
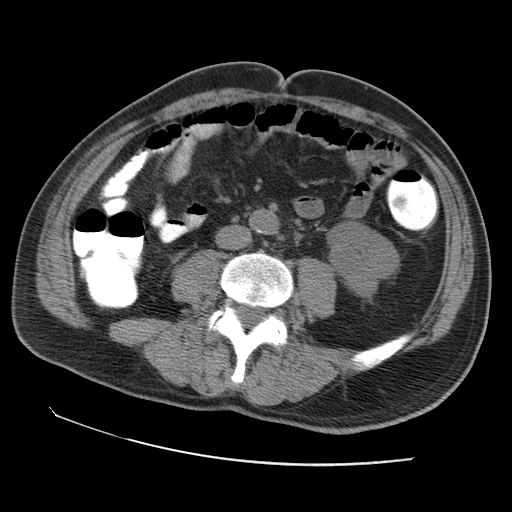
[im 46/92  lung]
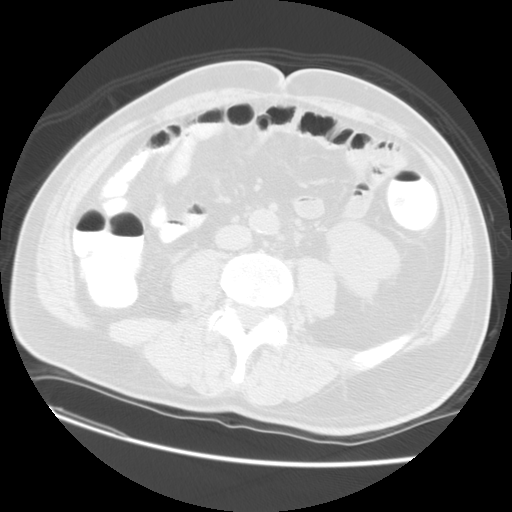
[im 57/92  soft-tissue]
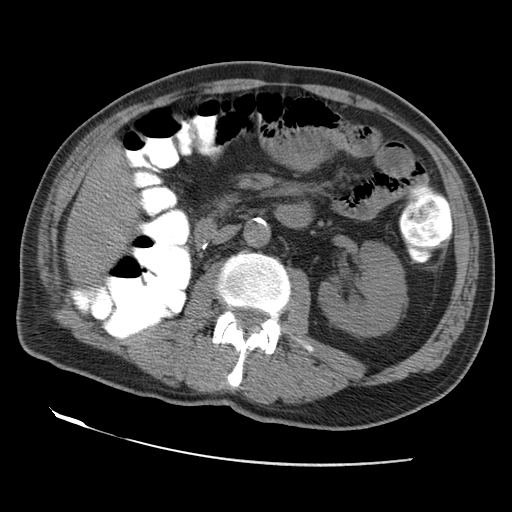
[im 57/92  lung]
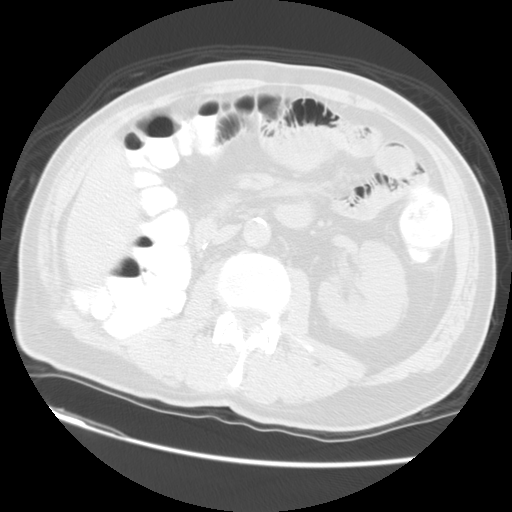
[im 69/92  soft-tissue]
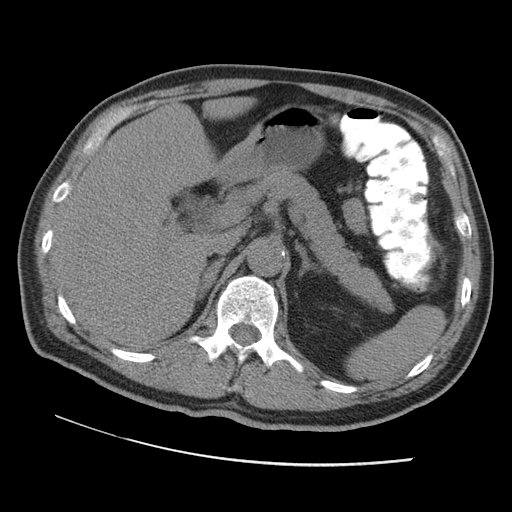
[im 69/92  lung]
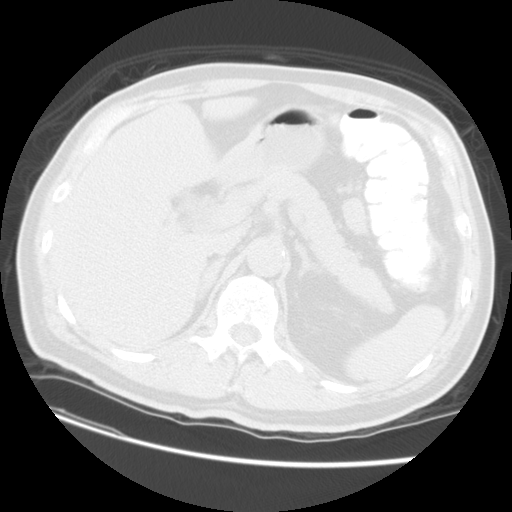
[im 80/92  soft-tissue]
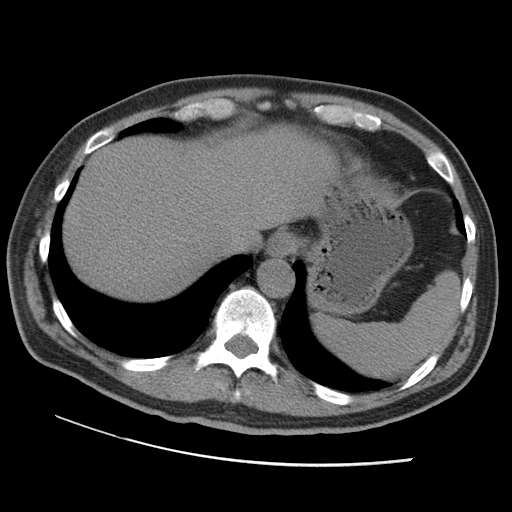
[im 80/92  lung]
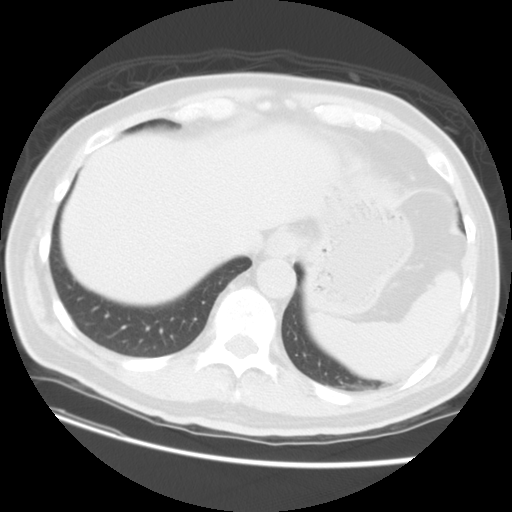

[Series 400: sagittal · sagittal · 0.91mm/px · 6 of 136 slices shown]
[im 13/136  soft-tissue]
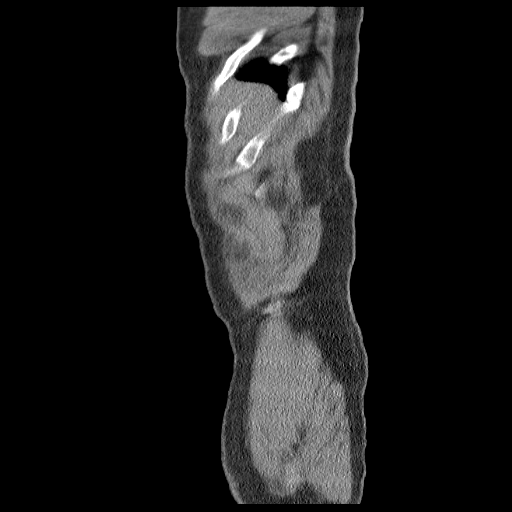
[im 25/136  soft-tissue]
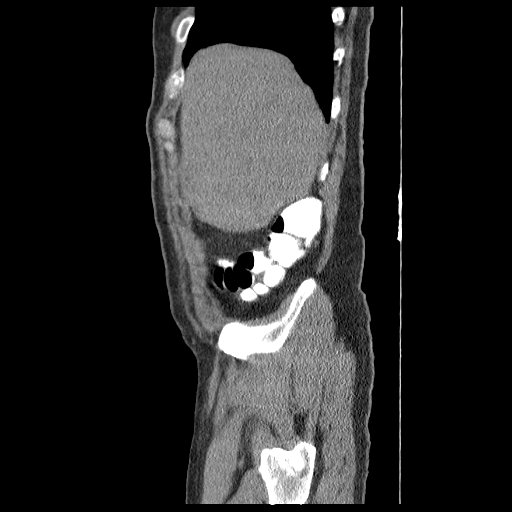
[im 50/136  soft-tissue]
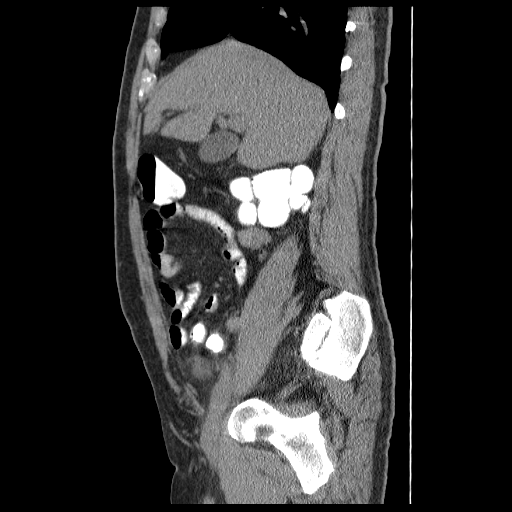
[im 62/136  soft-tissue]
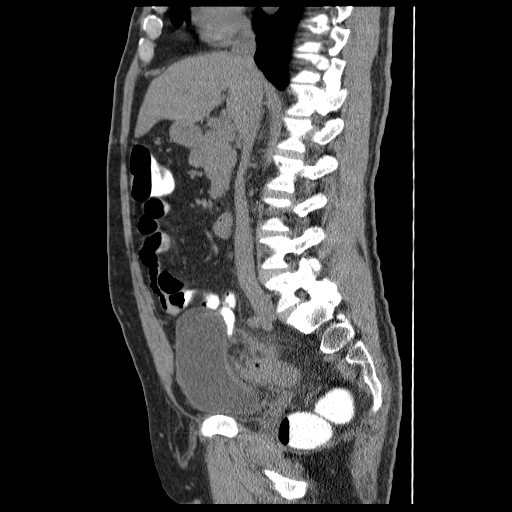
[im 74/136  soft-tissue]
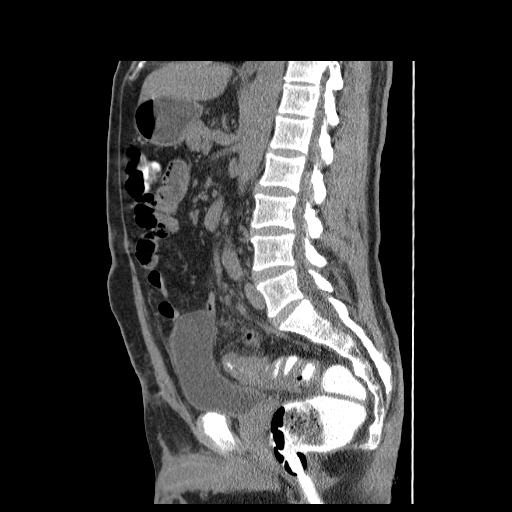
[im 86/136  soft-tissue]
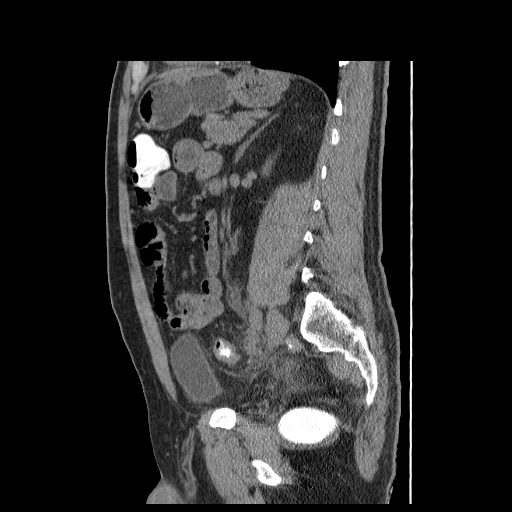

[13 of 32 positions shown; findings below may reference images not displayed]

FINDINGS: Minimal atelectasis left lung base.
Liver, spleen, pancreas, and adrenal glands normal appearance.
Surgical absence of right kidney.
Minimal collecting system dilatation left kidney.
Distended bladder.
Wall thickening of sigmoid colon little changed from previous exam.
Multiple small foci of extraluminal gas again identified within
sigmoid mesocolon, compatible with recent surgery.
No extravasation of rectal contrast identified to suggest an
anastomotic leak.
Largest gas and questionable fluid collection in left pelvis
appears minimally larger than on previous study, currently 2.3 x
1.6 cm, previously 1.9 x 1.3 cm.
Remaining bowel loops unremarkable.
Enlarged upper abdominal lymph nodes again identified, adjacent to
distal transverse colon 2.8 x 1.7 cm image 23 and anterior to the
right renal fossa, 3.3 x 1.7 cm, little changed.
No additional adenopathy, new abscess, or developing mass.
Minimal scattered atherosclerotic calcifications aorta.
IMPRESSION: Persistent thickening of the sigmoid wall compatible with preceding
surgery.
Persistent visualization of tiny extraluminal gas collections in
pelvis compatible with prior surgery and resolving abscesses, with
a small collection in the left pelvis minimally larger than on
previous study.
No evidence of rectal contrast extravasation to suggest an
anastomotic leak at sigmoid colon.
Soft tissue masses/adenopathy anterior to right renal fossa and
adjacent to distal transverse colon, question metastatic disease,
with both lesions showing hypermetabolic activity on PET CT of
07/22/2008.

## 2011-08-08 ENCOUNTER — Other Ambulatory Visit (HOSPITAL_COMMUNITY): Payer: Self-pay | Admitting: Urology

## 2011-08-08 ENCOUNTER — Ambulatory Visit (HOSPITAL_COMMUNITY)
Admission: RE | Admit: 2011-08-08 | Discharge: 2011-08-08 | Disposition: A | Discharge status: Home / Self Care | Payer: Managed Care, Other (non HMO) | Source: Ambulatory Visit | Attending: Urology | Admitting: Urology

## 2011-08-08 DIAGNOSIS — Z85528 Personal history of other malignant neoplasm of kidney: Secondary | ICD-10-CM

## 2011-08-08 DIAGNOSIS — D490 Neoplasm of unspecified behavior of digestive system: Secondary | ICD-10-CM

## 2011-08-08 DIAGNOSIS — I10 Essential (primary) hypertension: Secondary | ICD-10-CM | POA: Insufficient documentation

## 2011-08-14 ENCOUNTER — Telehealth: Payer: Self-pay | Admitting: *Deleted

## 2011-08-14 ENCOUNTER — Other Ambulatory Visit: Payer: Self-pay | Admitting: *Deleted

## 2011-08-14 ENCOUNTER — Ambulatory Visit: Payer: Managed Care, Other (non HMO) | Admitting: Oncology

## 2011-08-14 NOTE — Telephone Encounter (Signed)
left voice message to inform the patient of the new date and time of the rescheduled appointment

## 2011-08-25 ENCOUNTER — Other Ambulatory Visit: Payer: Self-pay | Admitting: Family Medicine

## 2011-10-02 ENCOUNTER — Ambulatory Visit (HOSPITAL_BASED_OUTPATIENT_CLINIC_OR_DEPARTMENT_OTHER): Payer: Managed Care, Other (non HMO) | Admitting: Oncology

## 2011-10-02 ENCOUNTER — Other Ambulatory Visit: Payer: Managed Care, Other (non HMO) | Admitting: Lab

## 2011-10-02 VITALS — BP 152/97 | HR 71 | Temp 97.3°F | Resp 18 | Ht 68.25 in | Wt 196.1 lb

## 2011-10-02 DIAGNOSIS — I10 Essential (primary) hypertension: Secondary | ICD-10-CM

## 2011-10-02 DIAGNOSIS — C649 Malignant neoplasm of unspecified kidney, except renal pelvis: Secondary | ICD-10-CM

## 2011-10-02 DIAGNOSIS — Z8549 Personal history of malignant neoplasm of other male genital organs: Secondary | ICD-10-CM

## 2011-10-02 NOTE — Progress Notes (Signed)
   [facility]    OFFICE PROGRESS NOTE   INTERVAL HISTORY:   He returns as scheduled. No complaint. No fever, pain, or anorexia. No episodes of diverticulitis.  A restaging CT evaluation on 08/09/2011 confirmed a stable enhancing lesion in the left upper quadrant consistent with excess responding tissue, status post right nephrectomy with no findings for recurrent or residual tumor. Stable enhancing soft tissue mass in the right abdomen was also felt to likely represent splenic tissue. Stable small mesenteric and retroperitoneal lymph nodes. A chest x-ray was negative on 08/08/2011.  Objective:  Vital signs in last 24 hours:  Blood pressure 152/97, pulse 71, temperature 97.3 F (36.3 C), temperature source Oral, resp. rate 18, height 5' 8.25" (1.734 m), weight 196 lb 1.6 oz (88.95 kg).    HEENT: Neck without mass Lymphatics: No cervical, supraclavicular, axillary, or inguinal nodes Resp: Lungs clear bilaterally Cardio: Regular rate and rhythm GI: No hepatosplenomegaly, no mass Vascular: No leg edema   Medications: I have reviewed the patient's current medications.  Assessment/Plan: 1. Renal cell carcinoma, status post a right nephrectomy in June 2005 for a 3.5-cm clear-cell carcinoma (pT1a, pNX).   2. Left abdominal mesenteric mass and right retroperitoneal mass with associated increased FDG activity, slightly increased in size since 2005.   a. Status post a core biopsy of the left mesenteric mass on 07/13/2004, with the pathology confirming a benign lymph node. b. Stable right abdominal and left upper quadrant soft tissue masses on a CT 08/09/2011 3. History of recurrent diverticulitis, status post a sigmoid colectomy, 08/27/2009, complicated by the development of a pelvic abscess. 4. Hypertension.    Disposition:  He remains in clinical remission from the renal cell carcinoma. No clinical or x-ray evidence for recurrent disease. He continues followup with  Dr.Ottelin. He will return for an office visit here in one year.   Thornton Papas, MD  10/02/2011  12:29 PM

## 2011-10-02 NOTE — Patient Instructions (Signed)
Jacob Stone 1950/04/11 [id number]  [facility] Discharge Instructions  Your exam findings, labs and results were discussed with your MD today.  Filed Vitals:   10/02/11 1125  BP: 152/97  Pulse: 71  Temp: 97.3 F (36.3 C)  Resp: 18    Current Outpatient Prescriptions on File Prior to Visit  Medication Sig Dispense Refill  . amLODipine (NORVASC) 5 MG tablet take 1 tablet by mouth every morning  100 tablet  3  . aspirin 325 MG tablet Take 325 mg by mouth daily.        . cetirizine (ZYRTEC) 10 MG tablet Take 10 mg by mouth daily.        . Docusate Calcium ([employer] STOOL SOFTENER PO) Take by mouth.        . pramoxine-mineral oil-zinc (ANUSOL) 1-12.5 % OINT Place rectally at bedtime.        . psyllium (METAMUCIL) 58.6 % packet Take 1 packet by mouth daily.          Please visit scheduling to obtain calendar for future appointments.  Please call the [facility] at [phone] during business hours should you have any further questions or need assistance in obtaining follow-up care. If you have a medical emergency, please dial 911.  Special Instructions:

## 2011-10-06 ENCOUNTER — Telehealth: Payer: Self-pay | Admitting: Oncology

## 2011-10-06 NOTE — Telephone Encounter (Signed)
lmonvm for pt re appt for 09/30/2012. Schedule mailed.

## 2011-11-10 ENCOUNTER — Ambulatory Visit (INDEPENDENT_AMBULATORY_CARE_PROVIDER_SITE_OTHER): Payer: Managed Care, Other (non HMO) | Admitting: *Deleted

## 2011-11-10 DIAGNOSIS — Z23 Encounter for immunization: Secondary | ICD-10-CM

## 2012-04-08 ENCOUNTER — Encounter: Payer: Self-pay | Admitting: Family Medicine

## 2012-04-08 ENCOUNTER — Ambulatory Visit (INDEPENDENT_AMBULATORY_CARE_PROVIDER_SITE_OTHER): Payer: Managed Care, Other (non HMO) | Admitting: Family Medicine

## 2012-04-08 VITALS — BP 132/84 | HR 93 | Temp 98.2°F | Wt 195.0 lb

## 2012-04-08 DIAGNOSIS — H6980 Other specified disorders of Eustachian tube, unspecified ear: Secondary | ICD-10-CM

## 2012-04-08 DIAGNOSIS — H6982 Other specified disorders of Eustachian tube, left ear: Secondary | ICD-10-CM

## 2012-04-08 NOTE — Progress Notes (Signed)
  Subjective:    Patient ID: Jacob Stone, male    DOB: Aug 09, 1950, 62 y.o.   MRN: [id number]  HPI Here for intermittent pain in the left ear for the past 5 days. No sinus congestion or ST or HA. Hearing is a bit muffled but no dizziness. Using Zyrtec daily.    Review of Systems  Constitutional: Negative.   HENT: Positive for hearing loss and ear pain. Negative for congestion, neck pain, postnasal drip, sinus pressure, tinnitus and ear discharge.   Eyes: Negative.   Respiratory: Negative.   Neurological: Negative.        Objective:   Physical Exam  Constitutional: He is oriented to person, place, and time. He appears well-developed and well-nourished. No distress.  HENT:  Head: Normocephalic and atraumatic.  Right Ear: External ear normal.  Left Ear: External ear normal.  Nose: Nose normal.  Mouth/Throat: Oropharynx is clear and moist.  Eyes: Conjunctivae are normal. Pupils are equal, round, and reactive to light.  Neck: No thyromegaly present.  Lymphadenopathy:    He has no cervical adenopathy.  Neurological: He is alert and oriented to person, place, and time. No cranial nerve deficit.          Assessment & Plan:  Given a steroid shot. Add Mucinex.

## 2012-04-09 MED ORDER — METHYLPREDNISOLONE ACETATE 80 MG/ML IJ SUSP
120.0000 mg | Freq: Once | INTRAMUSCULAR | Status: AC
  Administered 2012-04-09: 120 mg via INTRAMUSCULAR

## 2012-04-09 NOTE — Addendum Note (Signed)
Addended by: Aniceto Boss A on: 04/09/2012 10:12 AM   Modules accepted: Orders

## 2012-04-11 ENCOUNTER — Telehealth: Payer: Self-pay | Admitting: Family Medicine

## 2012-04-11 MED ORDER — AMOXICILLIN 875 MG PO TABS: 875.0000 mg | ORAL_TABLET | Freq: Two times a day (BID) | ORAL | Status: DC

## 2012-04-11 NOTE — Telephone Encounter (Signed)
Call in Amoxicillin 875 bid for 10 days  

## 2012-04-11 NOTE — Telephone Encounter (Signed)
Rx sent and patient is aware. 

## 2012-04-11 NOTE — Telephone Encounter (Signed)
Patient Information:  Caller Name: Arlando  Phone: [phone]  Patient: Jacob Stone,   Gender: Male  DOB: 10-14-1950  Age: 62 Years  PCP: Gershon Crane [facility])  Office Follow Up:  Does the office need to follow up with this patient?: Yes  Instructions For The Office: Does he need to be rechecked? Symptoms are the same as when seen on 04/08/12.   Symptoms  Reason For Call & Symptoms: Seen in the office on 04/08/12 for L ear pain and given Steriod injection and told to continue taking Musicnex. Symptoms have not improved, seem worse, hurts with swallowing,  drinking cold flluids and with head movement. Taking Tylenol takes edge off of pain. No other symptioms. Ear pain is not keeping him awake at night.  Reviewed Health History In EMR: Yes  Reviewed Medications In EMR: Yes  Reviewed Allergies In EMR: Yes  Reviewed Surgeries / Procedures: Yes  Date of Onset of Symptoms: 04/08/2012  Treatments Tried: Muscinex, Tylenol ES 2 tabs q 4 hr prn.  Treatments Tried Worked: No  Guideline(s) Used:  Earache  Disposition Per Guideline:   See Today in Office  Reason For Disposition Reached:   All other earaches (Exceptions: earache lasting < 1 hour, and earache from air travel)  Advice Given:  Pain Medicines:  For pain relief, you can take either acetaminophen, ibuprofen, or naproxen.  They are over-the-counter (OTC) pain drugs. You can buy them at the drugstore.  Pain Medicines:  For pain relief, you can take either acetaminophen, ibuprofen, or naproxen.  They are over-the-counter (OTC) pain drugs. You can buy them at the drugstore.  Acetaminophen (e.g., Tylenol):  Regular Strength Tylenol: Take 650 mg (two 325 mg pills) by mouth every 4-6 hours as needed. Each Regular Strength Tylenol pill has 325 mg of acetaminophen.  Extra Strength Tylenol: Take 1,000 mg (two 500 mg pills) every 8 hours as needed. Each Extra Strength Tylenol pill has 500 mg of acetaminophen.  The most you should take each  day is 3,000 mg (10 Regular Strength or 6 Extra Strength pills a day).  Call Back If  High fever, severe headache, or stiff neck occurs  You become worse.  Patient Refused Recommendation:  Patient Requests Prescription  Please ask Dr. Clent Ridges if he needs to be rechecked in the office or if antibiotic is needed.

## 2012-08-19 ENCOUNTER — Ambulatory Visit (HOSPITAL_COMMUNITY)
Admission: RE | Admit: 2012-08-19 | Discharge: 2012-08-19 | Disposition: A | Discharge status: Home / Self Care | Payer: Managed Care, Other (non HMO) | Source: Ambulatory Visit | Attending: Urology | Admitting: Urology

## 2012-08-19 ENCOUNTER — Other Ambulatory Visit (HOSPITAL_COMMUNITY): Payer: Self-pay | Admitting: Urology

## 2012-08-19 DIAGNOSIS — D490 Neoplasm of unspecified behavior of digestive system: Secondary | ICD-10-CM

## 2012-08-19 DIAGNOSIS — Z85528 Personal history of other malignant neoplasm of kidney: Secondary | ICD-10-CM | POA: Insufficient documentation

## 2012-09-19 ENCOUNTER — Other Ambulatory Visit: Payer: Self-pay | Admitting: Family Medicine

## 2012-09-30 ENCOUNTER — Ambulatory Visit: Payer: Managed Care, Other (non HMO) | Admitting: Oncology

## 2012-11-28 ENCOUNTER — Ambulatory Visit (INDEPENDENT_AMBULATORY_CARE_PROVIDER_SITE_OTHER): Payer: Managed Care, Other (non HMO) | Admitting: *Deleted

## 2012-11-28 DIAGNOSIS — Z23 Encounter for immunization: Secondary | ICD-10-CM

## 2012-12-24 ENCOUNTER — Ambulatory Visit: Payer: Self-pay | Admitting: Family Medicine

## 2012-12-24 ENCOUNTER — Telehealth: Payer: Self-pay | Admitting: Family Medicine

## 2012-12-24 ENCOUNTER — Encounter: Payer: Self-pay | Admitting: Family Medicine

## 2012-12-24 ENCOUNTER — Ambulatory Visit (INDEPENDENT_AMBULATORY_CARE_PROVIDER_SITE_OTHER): Payer: Managed Care, Other (non HMO) | Admitting: Family Medicine

## 2012-12-24 VITALS — BP 170/110 | Temp 97.8°F | Wt 199.0 lb

## 2012-12-24 DIAGNOSIS — IMO0002 Reserved for concepts with insufficient information to code with codable children: Secondary | ICD-10-CM

## 2012-12-24 MED ORDER — HYDROCHLOROTHIAZIDE 12.5 MG PO TABS: 12.5000 mg | ORAL_TABLET | Freq: Every day | ORAL | Status: DC

## 2012-12-24 MED ORDER — AMLODIPINE BESYLATE 10 MG PO TABS: 10.0000 mg | ORAL_TABLET | Freq: Every day | ORAL | Status: DC

## 2012-12-24 NOTE — Progress Notes (Signed)
  Subjective:    Patient ID: Jacob Stone, male    DOB: [birth year], 62 y.o.   MRN: [id number]  HPI Jacob Stone is a 62 year old male nonsmoker who comes in today for followup of hypertension  His blood pressure been well controlled on Norvasc 5 mg daily and to recently hasn't felt well. He's been monitoring his blood pressure at home with a new cuff. His BPs run 170/110 which is about the same as forgetting here. He's been compliant with his daily medication    Review of Systems    review of systems negative no change in diet no salt Objective:   Physical Exam  Well-developed well-nourished male no acute distress vital signs stable is afebrile except for BP 170/110 right arm sitting position      Assessment & Plan:  Hypertension not at goal increase Norvasc to 10 mg daily and diuretic followup in one month

## 2012-12-24 NOTE — Telephone Encounter (Signed)
Patient Information:  Caller Name: Tor  Phone: [phone]  Patient: Jacob Stone, Jacob Stone  Gender: Male  DOB: October 09, 1950  Age: 62 Years  PCP: Gershon Crane [facility])  Office Follow Up:  Does the office need to follow up with this patient?: No  Instructions For The Office: N/A   Symptoms  Reason For Call & Symptoms: Saw nephrologist on Thursday 11/13, found blood pressure reading 140/100, told to monitor BP readings.  Has been taking daily and yesterday 11/17 was 159/90, today about the same.   Concerned, would like to be seen today  Reviewed Health History In EMR: Yes  Reviewed Medications In EMR: Yes  Reviewed Allergies In EMR: Yes  Reviewed Surgeries / Procedures: Yes  Date of Onset of Symptoms: 12/19/2012  Guideline(s) Used:  High Blood Pressure  Disposition Per Guideline:   See Within 2 Weeks in Office  Reason For Disposition Reached:   BP > 140/90 and is taking BP medications  Advice Given:  N/A  Patient Will Follow Care Advice:  YES  Appointment Scheduled:  12/24/2012 11:00:00 Appointment Scheduled Provider:  Gershon Crane [facility])

## 2012-12-24 NOTE — Telephone Encounter (Signed)
Noted  

## 2012-12-24 NOTE — Patient Instructions (Signed)
Norvasc 10 mg,,,,, hydrochlorothiazide 12.5 mg,,,,,,,,,,,,,,,,,,,,,,,, one of each daily in the morning  Check your blood pressure daily in the morning  Return in one month for followup with all your blood pressure readings and the device

## 2013-01-23 ENCOUNTER — Encounter: Payer: Self-pay | Admitting: Family Medicine

## 2013-01-23 ENCOUNTER — Ambulatory Visit (INDEPENDENT_AMBULATORY_CARE_PROVIDER_SITE_OTHER): Payer: Managed Care, Other (non HMO) | Admitting: Family Medicine

## 2013-01-23 VITALS — BP 142/86 | HR 79 | Temp 97.7°F | Resp 20 | Wt 199.0 lb

## 2013-01-23 DIAGNOSIS — IMO0002 Reserved for concepts with insufficient information to code with codable children: Secondary | ICD-10-CM

## 2013-01-23 MED ORDER — LOSARTAN POTASSIUM 25 MG PO TABS: 25.0000 mg | ORAL_TABLET | Freq: Every day | ORAL | Status: DC

## 2013-01-23 NOTE — Progress Notes (Signed)
   Subjective:    Patient ID: Jacob Stone, male    DOB: 1950-02-27, 62 y.o.   MRN: [id number]  HPI Jacob Stone is a 62 year old male who comes in today for evaluation of hypertension  We recently increased his Norvasc from 5-10 mg because his blood pressure is not at goal. BP still not at goal but down to 140/86   Review of Systems Negative    Objective:   Physical Exam  Well-developed well-nourished male no acute distress vital signs stable he is afebrile BP 140/86 right arm sitting position      Assessment & Plan:  Hypertension not at but approaching goal add

## 2013-01-23 NOTE — Patient Instructions (Signed)
Norvasc 10 mg, hydrochlorothiazide 12.5 mg, Cozaar 25 mg,,,,,,,,,,,,,,,,,,,, one of each in the morning for high blood pressure  Check your blood pressure daily in the morning  After 4 weeks your blood pressure should be 135/85 or less if not call

## 2013-01-23 NOTE — Progress Notes (Signed)
Pre-visit discussion using our clinic review tool. No additional management support is needed unless otherwise documented below in the visit note.  

## 2013-03-11 ENCOUNTER — Other Ambulatory Visit: Payer: Self-pay | Admitting: Dermatology

## 2013-08-21 ENCOUNTER — Other Ambulatory Visit (HOSPITAL_COMMUNITY): Payer: Self-pay | Admitting: Urology

## 2013-08-21 ENCOUNTER — Ambulatory Visit (HOSPITAL_COMMUNITY)
Admission: RE | Admit: 2013-08-21 | Discharge: 2013-08-21 | Disposition: A | Discharge status: Home / Self Care | Payer: Managed Care, Other (non HMO) | Source: Ambulatory Visit | Attending: Urology | Admitting: Urology

## 2013-08-21 DIAGNOSIS — C649 Malignant neoplasm of unspecified kidney, except renal pelvis: Secondary | ICD-10-CM | POA: Insufficient documentation

## 2013-08-21 DIAGNOSIS — Z85528 Personal history of other malignant neoplasm of kidney: Secondary | ICD-10-CM

## 2013-12-24 ENCOUNTER — Other Ambulatory Visit: Payer: Self-pay | Admitting: Family Medicine

## 2013-12-26 ENCOUNTER — Other Ambulatory Visit: Payer: Self-pay | Admitting: Family Medicine

## 2014-02-19 ENCOUNTER — Other Ambulatory Visit: Payer: Self-pay | Admitting: Family Medicine

## 2014-03-01 ENCOUNTER — Other Ambulatory Visit: Payer: Self-pay | Admitting: Family Medicine

## 2014-03-09 ENCOUNTER — Ambulatory Visit (INDEPENDENT_AMBULATORY_CARE_PROVIDER_SITE_OTHER): Payer: Managed Care, Other (non HMO) | Admitting: Family Medicine

## 2014-03-09 ENCOUNTER — Encounter: Payer: Self-pay | Admitting: Family Medicine

## 2014-03-09 VITALS — BP 132/80 | HR 73 | Temp 97.8°F | Ht 68.25 in | Wt 201.3 lb

## 2014-03-09 DIAGNOSIS — I10 Essential (primary) hypertension: Secondary | ICD-10-CM

## 2014-03-09 DIAGNOSIS — C641 Malignant neoplasm of right kidney, except renal pelvis: Secondary | ICD-10-CM

## 2014-03-09 LAB — CBC WITH DIFFERENTIAL/PLATELET
Basophils Absolute: 0 10*3/uL (ref 0.0–0.1)
Basophils Relative: 0.5 % (ref 0.0–3.0)
EOS PCT: 4.4 % (ref 0.0–5.0)
Eosinophils Absolute: 0.3 10*3/uL (ref 0.0–0.7)
HCT: 46.3 % (ref 39.0–52.0)
HEMOGLOBIN: 15.8 g/dL (ref 13.0–17.0)
LYMPHS PCT: 30.6 % (ref 12.0–46.0)
Lymphs Abs: 2.1 10*3/uL (ref 0.7–4.0)
MCHC: 34.1 g/dL (ref 30.0–36.0)
MCV: 90.4 fl (ref 78.0–100.0)
MONO ABS: 0.6 10*3/uL (ref 0.1–1.0)
MONOS PCT: 8.7 % (ref 3.0–12.0)
Neutro Abs: 3.9 10*3/uL (ref 1.4–7.7)
Neutrophils Relative %: 55.8 % (ref 43.0–77.0)
Platelets: 309 10*3/uL (ref 150.0–400.0)
RBC: 5.12 Mil/uL (ref 4.22–5.81)
RDW: 13.2 % (ref 11.5–15.5)
WBC: 6.9 10*3/uL (ref 4.0–10.5)

## 2014-03-09 LAB — BASIC METABOLIC PANEL
BUN: 21 mg/dL (ref 6–23)
CHLORIDE: 104 meq/L (ref 96–112)
CO2: 26 mEq/L (ref 19–32)
Calcium: 9.9 mg/dL (ref 8.4–10.5)
Creatinine, Ser: 1.36 mg/dL (ref 0.40–1.50)
GFR: 55.71 mL/min — ABNORMAL LOW (ref >60.00)
GLUCOSE: 118 mg/dL — AB (ref 70–99)
Potassium: 4.4 mEq/L (ref 3.5–5.1)
SODIUM: 142 meq/L (ref 135–145)

## 2014-03-09 LAB — POCT URINALYSIS DIPSTICK
Bilirubin, UA: NEGATIVE
Glucose, UA: NEGATIVE
KETONES UA: NEGATIVE
LEUKOCYTES UA: NEGATIVE
NITRITE UA: NEGATIVE
PH UA: 5
PROTEIN UA: NEGATIVE
RBC UA: NEGATIVE
SPEC GRAV UA: 1.015
Urobilinogen, UA: 0.2

## 2014-03-09 LAB — HEPATIC FUNCTION PANEL
ALBUMIN: 4.4 g/dL (ref 3.5–5.2)
ALT: 58 U/L — ABNORMAL HIGH (ref 0–53)
AST: 32 U/L (ref 0–37)
Alkaline Phosphatase: 72 U/L (ref 39–117)
BILIRUBIN TOTAL: 0.5 mg/dL (ref 0.2–1.2)
Bilirubin, Direct: 0.1 mg/dL (ref 0.0–0.3)
TOTAL PROTEIN: 7.3 g/dL (ref 6.0–8.3)

## 2014-03-09 LAB — LIPID PANEL
CHOLESTEROL: 176 mg/dL (ref 0–200)
HDL: 43.3 mg/dL (ref >39.00)
Total CHOL/HDL Ratio: 4
Triglycerides: 456 mg/dL — ABNORMAL HIGH (ref 0.0–149.0)

## 2014-03-09 LAB — PSA: PSA: 3.94 ng/mL (ref 0.10–4.00)

## 2014-03-09 LAB — TSH: TSH: 2.69 u[IU]/mL (ref 0.35–4.50)

## 2014-03-09 LAB — LDL CHOLESTEROL, DIRECT: LDL DIRECT: 107 mg/dL

## 2014-03-09 MED ORDER — HYDROCHLOROTHIAZIDE 12.5 MG PO TABS: 12.5000 mg | ORAL_TABLET | Freq: Every day | ORAL | Status: DC

## 2014-03-09 MED ORDER — LOSARTAN POTASSIUM 25 MG PO TABS: 25.0000 mg | ORAL_TABLET | Freq: Every day | ORAL | Status: DC

## 2014-03-09 MED ORDER — AMLODIPINE BESYLATE 10 MG PO TABS: 10.0000 mg | ORAL_TABLET | Freq: Every day | ORAL | Status: DC

## 2014-03-09 NOTE — Progress Notes (Signed)
   Subjective:    Patient ID: Jacob Stone, male    DOB: [birth year], 64 y.o.   MRN: [id number]  HPI Jacob Stone is a 64 year old married male nonsmoker who comes in today to renew his blood pressure medication. We've not seen him in a couple years. He's been going to to urology for yearly evaluation see had a renal cell carcinoma diagnosed 5 years ago. The kidney was removed in toto the tumor did not extend beyond the capsule and he is disease-free and been released by his urologist. He says his urologist is been doing his annual checkups.   Review of Systems    review of systems otherwise negative says he feels well no complaints Objective:   Physical Exam  Well-developed well-nourished male no acute distress vital signs stable he's afebrile BP today is 132/80      Assessment & Plan:  Hypertension at goal..... Continue current therapy... Set up CPX  History of carotid artery calcifications with normal ultrasound  History of renal cell carcinoma........ is status post surgical removal 5 years ago.... Asymptomatic,, released by urology

## 2014-03-09 NOTE — Progress Notes (Signed)
Pre visit review using our clinic review tool, if applicable. No additional management support is needed unless otherwise documented below in the visit note. 

## 2014-03-09 NOTE — Patient Instructions (Signed)
Continue current medications  Labs today  Set up a 30 minute appointment the fourth week in February for evaluation

## 2014-03-10 ENCOUNTER — Telehealth: Payer: Self-pay | Admitting: Family Medicine

## 2014-03-10 NOTE — Telephone Encounter (Signed)
emmi emailed °

## 2014-03-11 ENCOUNTER — Other Ambulatory Visit: Payer: Self-pay | Admitting: Dermatology

## 2014-03-26 ENCOUNTER — Encounter: Payer: Self-pay | Admitting: Family Medicine

## 2014-03-26 ENCOUNTER — Ambulatory Visit (INDEPENDENT_AMBULATORY_CARE_PROVIDER_SITE_OTHER): Payer: Managed Care, Other (non HMO) | Admitting: Family Medicine

## 2014-03-26 VITALS — BP 140/80 | HR 78 | Temp 97.7°F | Wt 202.0 lb

## 2014-03-26 DIAGNOSIS — B9789 Other viral agents as the cause of diseases classified elsewhere: Principal | ICD-10-CM

## 2014-03-26 DIAGNOSIS — J069 Acute upper respiratory infection, unspecified: Secondary | ICD-10-CM

## 2014-03-26 NOTE — Patient Instructions (Signed)

## 2014-03-26 NOTE — Progress Notes (Signed)
   Subjective:    Patient ID: Jacob Stone, male    DOB: [birth year], 64 y.o.   MRN: [id number]  HPI Acute visit. Patient seen with one-day history of dry cough. Had some recent travels with flying. He's had a few days of some sinus and nasal congestion. Occasional postnasal drip. Cough has been relatively mild and improved with Delsym. He is using Afrin for his nasal congestion. No fever. Denies any nausea or vomiting. He has hypertension which has been relatively stable.  No past medical history on file. Past Surgical History  Procedure Laterality Date  . Nephrectomy      left    reports that he has never smoked. He has never used smokeless tobacco. He reports that he drinks alcohol. He reports that he does not use illicit drugs. family history is not on file. No Known Allergies    Review of Systems  Constitutional: Negative for fever and chills.  HENT: Positive for congestion, postnasal drip, rhinorrhea and sinus pressure.   Respiratory: Positive for cough.        Objective:   Physical Exam  Constitutional: He appears well-developed and well-nourished.  HENT:  Right Ear: External ear normal.  Left Ear: External ear normal.  Mouth/Throat: Oropharynx is clear and moist.  Neck: Neck supple.  Cardiovascular: Normal rate and regular rhythm.   Pulmonary/Chest: Effort normal and breath sounds normal. No respiratory distress. He has no wheezes.  Lymphadenopathy:    He has no cervical adenopathy.          Assessment & Plan:  Viral URI with cough. Nonfocal exam. Continue over-the-counter Delsym. Continue Tylenol for body aches. Follow-up for any increasing fevers or worsening symptoms-otherwise he is aware this may take several days to fully resolve No indication for antibiotics at this time.

## 2014-03-26 NOTE — Progress Notes (Signed)
Pre visit review using our clinic review tool, if applicable. No additional management support is needed unless otherwise documented below in the visit note. 

## 2014-04-03 ENCOUNTER — Telehealth: Payer: Self-pay | Admitting: Family Medicine

## 2014-04-03 MED ORDER — HYDROCODONE-HOMATROPINE 5-1.5 MG/5ML PO SYRP: 5.0000 mL | ORAL_SOLUTION | Freq: Four times a day (QID) | ORAL | Status: DC | PRN

## 2014-04-03 NOTE — Telephone Encounter (Signed)
Hycodan one tsp po q 6 hours prn severe cough-120 ml.  If no fever would not start antibiotics at this time

## 2014-04-03 NOTE — Telephone Encounter (Signed)
Pt saw dr Elease Hashimoto last thurs 2/18 and is not any better.  Pt states the coughing is keeping him up at night, lots of congestion and feels like ear may be clogged. Pt would like to pu a cough med and possibly antibiotic. Pt has a fu w/ dr Sherren Mocha on Monday, but needs relief for the weekend if possible. [facility]/ [address]

## 2014-04-03 NOTE — Telephone Encounter (Signed)
Pt aware that Rx is ready for pick up 

## 2014-04-06 ENCOUNTER — Ambulatory Visit (INDEPENDENT_AMBULATORY_CARE_PROVIDER_SITE_OTHER): Payer: Managed Care, Other (non HMO) | Admitting: Family Medicine

## 2014-04-06 DIAGNOSIS — E782 Mixed hyperlipidemia: Secondary | ICD-10-CM

## 2014-04-06 DIAGNOSIS — I10 Essential (primary) hypertension: Secondary | ICD-10-CM

## 2014-04-06 DIAGNOSIS — Z23 Encounter for immunization: Secondary | ICD-10-CM

## 2014-04-06 DIAGNOSIS — R7309 Other abnormal glucose: Secondary | ICD-10-CM

## 2014-04-06 DIAGNOSIS — R739 Hyperglycemia, unspecified: Secondary | ICD-10-CM

## 2014-04-06 DIAGNOSIS — Z0000 Encounter for general adult medical examination without abnormal findings: Secondary | ICD-10-CM

## 2014-04-06 MED ORDER — METFORMIN HCL 500 MG PO TABS: ORAL_TABLET | ORAL | Status: DC

## 2014-04-06 MED ORDER — HYDROCODONE-HOMATROPINE 5-1.5 MG/5ML PO SYRP: 5.0000 mL | ORAL_SOLUTION | Freq: Four times a day (QID) | ORAL | Status: DC | PRN

## 2014-04-06 NOTE — Progress Notes (Signed)
Subjective:    Patient ID: Jacob Stone, male    DOB: Jul 04, 1950, 64 y.o.   MRN: [id number]  HPI Jacob Stone is a 64 year old married male nonsmoker who comes in today for general physical examination because of a history of hypertension  He's on a combination Norvasc 10 mg and Cozaar 25 mg daily for hypertension BP 120/80  He gets routine eye care, dental care, had part of his colon removed because severe diverticulitis. He sees the folks over at [place] for follow-up  He's doing Pneumovax and a tetanus booster. He's had a shingles vaccine he also has a history of renal cell carcinoma. He had a nephrectomy by Dr. mo at the urology center  Cognitive function normal he goes to the [facility] 3 times to 4 times per week  Home health safety reviewed no issues identified, no guns in the house, he does have a healthcare power of attorney and living well  He continues to work full-time for a [employer]     Review of Systems  Constitutional: Negative.   HENT: Negative.   Eyes: Negative.   Respiratory: Negative.   Cardiovascular: Negative.   Gastrointestinal: Negative.   Endocrine: Negative.   Genitourinary: Negative.   Musculoskeletal: Negative.   Skin: Negative.   Allergic/Immunologic: Negative.   Neurological: Negative.   Hematological: Negative.   Psychiatric/Behavioral: Negative.        Objective:   Physical Exam  Constitutional: He is oriented to person, place, and time. He appears well-developed and well-nourished.  HENT:  Head: Normocephalic and atraumatic.  Right Ear: External ear normal.  Left Ear: External ear normal.  Nose: Nose normal.  Mouth/Throat: Oropharynx is clear and moist.  Eyes: Conjunctivae and EOM are normal. Pupils are equal, round, and reactive to light.  Neck: Normal range of motion. Neck supple. No JVD present. No tracheal deviation present. No thyromegaly present.  Cardiovascular: Normal rate, regular rhythm, normal heart sounds and intact  distal pulses.  Exam reveals no gallop and no friction rub.   No murmur heard. Pulmonary/Chest: Effort normal and breath sounds normal. No stridor. No respiratory distress. He has no wheezes. He has no rales. He exhibits no tenderness.  Abdominal: Soft. Bowel sounds are normal. He exhibits no distension and no mass. There is no tenderness. There is no rebound and no guarding.  Genitourinary: Rectum normal and penis normal. Guaiac negative stool. No penile tenderness.  1+ symmetrical nonnodular BPH  Musculoskeletal: Normal range of motion. He exhibits no edema or tenderness.  Lymphadenopathy:    He has no cervical adenopathy.  Neurological: He is alert and oriented to person, place, and time. He has normal reflexes. No cranial nerve deficit. He exhibits normal muscle tone.  Skin: Skin is warm and dry. No rash noted. No erythema. No pallor.  Total body skin exam normal except for scar right scapula. He had a lesion removed by Dr. Ronnald Ramp his dermatologist. He has a lot of freckles and moles no history of skin cancer  Psychiatric: He has a normal mood and affect. His behavior is normal. Judgment and thought content normal.  Nursing note and vitals reviewed.         Assessment & Plan:  Healthy male  Hypertension at goal,,,,,, continue current therapy  History of diverticulosis and diverticulitis with 12 inches of his colon removed for severe diverticulitis,,,, asymptomatic followed by GI  History of renal cell carcinoma,,,,,,, asymptomatic......... was followed by Dr. Ma Rings for 10 years and released.  Elevated triglycerides and  prediabetic blood sugar fasting 118 triglyceride level 456......... avoid raw sugar.....Marland Kitchen continue exercise........ add metformin........ follow-up in 3 months.

## 2014-04-06 NOTE — Patient Instructions (Signed)
Continue current medications  Follow-up in one year for general physical examination sooner if any problems

## 2014-04-06 NOTE — Progress Notes (Signed)
Pre visit review using our clinic review tool, if applicable. No additional management support is needed unless otherwise documented below in the visit note. 

## 2014-04-09 ENCOUNTER — Ambulatory Visit (INDEPENDENT_AMBULATORY_CARE_PROVIDER_SITE_OTHER): Payer: Managed Care, Other (non HMO) | Admitting: Family Medicine

## 2014-04-09 ENCOUNTER — Encounter: Payer: Self-pay | Admitting: Family Medicine

## 2014-04-09 VITALS — BP 130/90 | Temp 99.1°F | Wt 201.0 lb

## 2014-04-09 DIAGNOSIS — M10071 Idiopathic gout, right ankle and foot: Secondary | ICD-10-CM

## 2014-04-09 DIAGNOSIS — M109 Gout, unspecified: Secondary | ICD-10-CM | POA: Insufficient documentation

## 2014-04-09 LAB — URIC ACID: URIC ACID, SERUM: 7.8 mg/dL (ref 4.0–7.8)

## 2014-04-09 MED ORDER — PREDNISONE 20 MG PO TABS: ORAL_TABLET | ORAL | Status: DC

## 2014-04-09 MED ORDER — ALLOPURINOL 300 MG PO TABS: 300.0000 mg | ORAL_TABLET | Freq: Every day | ORAL | Status: DC

## 2014-04-09 NOTE — Progress Notes (Signed)
Pre visit review using our clinic review tool, if applicable. No additional management support is needed unless otherwise documented below in the visit note. 

## 2014-04-09 NOTE — Patient Instructions (Signed)
Prednisone 20 mg,,,,, one for 2 days,,,,, a half-tablet for 2 days,,,,,,,, then a half a tab Monday Wednesday Friday for a two-week taper  Allopurinol 300 mg.......Marland Kitchen 1 daily forever  Labs today

## 2014-04-09 NOTE — Progress Notes (Signed)
   Subjective:    Patient ID: Jacob Stone, male    DOB: 1950-03-11, 64 y.o.   MRN: [id number]  HPI Jacob Stone is a 64 year old married male nonsmoker who comes in today for evaluation of acute gout  He says 3 days ago he developed the sudden onset of pain in his right heel which went away after 24 hours but then he developed a sudden onset of pain and swelling and redness in his right great toe. It was so painful he couldn't stand the sheets to touch his feet. He looked it up on the Internet and found he probably had gout.  Positive family history of gout  No alcohol   Review of Systems    review of systems negative Objective:   Physical Exam  Well-developed well-nourished male no acute distress vital signs stable is afebrile examination of the right foot shows a good pulse. The proximal joint his groin is red and swollen consistent with acute gout      Assessment & Plan:  Acute gout.............. check uric acid level started on prednisone and allopurinol.

## 2014-06-22 ENCOUNTER — Other Ambulatory Visit: Payer: Self-pay | Admitting: Family Medicine

## 2014-06-29 ENCOUNTER — Other Ambulatory Visit: Payer: Managed Care, Other (non HMO)

## 2014-07-01 ENCOUNTER — Other Ambulatory Visit (INDEPENDENT_AMBULATORY_CARE_PROVIDER_SITE_OTHER): Payer: Managed Care, Other (non HMO)

## 2014-07-01 DIAGNOSIS — I10 Essential (primary) hypertension: Secondary | ICD-10-CM

## 2014-07-01 DIAGNOSIS — E119 Type 2 diabetes mellitus without complications: Secondary | ICD-10-CM

## 2014-07-01 DIAGNOSIS — E785 Hyperlipidemia, unspecified: Secondary | ICD-10-CM | POA: Diagnosis not present

## 2014-07-02 LAB — BASIC METABOLIC PANEL
BUN: 20 mg/dL (ref 6–23)
CO2: 25 meq/L (ref 19–32)
Calcium: 9.5 mg/dL (ref 8.4–10.5)
Chloride: 103 mEq/L (ref 96–112)
Creatinine, Ser: 1.38 mg/dL (ref 0.40–1.50)
GFR: 54.73 mL/min — AB (ref >60.00)
GLUCOSE: 104 mg/dL — AB (ref 70–99)
POTASSIUM: 4 meq/L (ref 3.5–5.1)
SODIUM: 138 meq/L (ref 135–145)

## 2014-07-02 LAB — LIPID PANEL
Cholesterol: 165 mg/dL (ref 0–200)
HDL: 43.5 mg/dL (ref >39.00)
NONHDL: 121.5
Total CHOL/HDL Ratio: 4
Triglycerides: 237 mg/dL — ABNORMAL HIGH (ref 0.0–149.0)
VLDL: 47.4 mg/dL — AB (ref 0.0–40.0)

## 2014-07-02 LAB — HEMOGLOBIN A1C: Hgb A1c MFr Bld: 6 % (ref 4.6–6.5)

## 2014-07-02 LAB — LDL CHOLESTEROL, DIRECT: LDL DIRECT: 103 mg/dL

## 2014-07-07 ENCOUNTER — Ambulatory Visit (INDEPENDENT_AMBULATORY_CARE_PROVIDER_SITE_OTHER): Payer: Managed Care, Other (non HMO) | Admitting: Family Medicine

## 2014-07-07 ENCOUNTER — Encounter: Payer: Self-pay | Admitting: Family Medicine

## 2014-07-07 VITALS — BP 130/80 | Temp 98.4°F | Wt 199.0 lb

## 2014-07-07 DIAGNOSIS — M722 Plantar fascial fibromatosis: Secondary | ICD-10-CM | POA: Insufficient documentation

## 2014-07-07 DIAGNOSIS — E139 Other specified diabetes mellitus without complications: Secondary | ICD-10-CM | POA: Diagnosis not present

## 2014-07-07 DIAGNOSIS — I6523 Occlusion and stenosis of bilateral carotid arteries: Secondary | ICD-10-CM

## 2014-07-07 MED ORDER — SIMVASTATIN 20 MG PO TABS: 20.0000 mg | ORAL_TABLET | Freq: Every day | ORAL | Status: DC

## 2014-07-07 NOTE — Progress Notes (Signed)
   Subjective:    Patient ID: [name], male    DOB: 10/19/1950, 65 y.o.   MRN: [id number]  HPI Jacob Stone is a 64 year old male who comes in today for follow-up of 2 problems and a new problem  He's got a history of diabetes is on metformin 250 mg daily. His A1c now is 6.0%.  He has a history of hyperlipidemia. Since these gotten his blood sugar down lipids of also drop but his LDL is 103. Because of the carotid artery calcifications I would get his LDL down below 75. We'll add a statin today  He says had difficulty walking because his pain in his left heel. It's worse when he sits for long periods of time. No history of trauma   Review of Systems Review of systems otherwise negative physical examination February 2016 otherwise normal    Objective:   Physical Exam  Well-developed well-nourished male no acute distress vital signs stable he is afebrile      Assessment & Plan:  Diabetes type 2 at goal........ continue current therapy follow-up A1c in 6 months  Hyperlipidemia.......... add Zocor 20 mg daily at bedtime follow-up lipid and liver panel in 2 months  Plantar fasciitis......... consult with physical therapist Jacob Stone

## 2014-07-07 NOTE — Progress Notes (Signed)
Pre visit review using our clinic review tool, if applicable. No additional management support is needed unless otherwise documented below in the visit note. 

## 2014-07-07 NOTE — Patient Instructions (Signed)
Continue current medications except at the Zocor 20 mg daily at bedtime  Follow-up the last week in July on your cholesterol and your blood sugar with Jacob Stone  Fasting labs one week prior

## 2014-08-26 ENCOUNTER — Other Ambulatory Visit: Payer: Managed Care, Other (non HMO)

## 2014-09-02 ENCOUNTER — Ambulatory Visit: Payer: Managed Care, Other (non HMO) | Admitting: Adult Health

## 2014-09-07 ENCOUNTER — Other Ambulatory Visit: Payer: Managed Care, Other (non HMO)

## 2014-09-09 ENCOUNTER — Other Ambulatory Visit (INDEPENDENT_AMBULATORY_CARE_PROVIDER_SITE_OTHER): Payer: Managed Care, Other (non HMO)

## 2014-09-09 DIAGNOSIS — I10 Essential (primary) hypertension: Secondary | ICD-10-CM

## 2014-09-09 DIAGNOSIS — E119 Type 2 diabetes mellitus without complications: Secondary | ICD-10-CM

## 2014-09-09 DIAGNOSIS — E785 Hyperlipidemia, unspecified: Secondary | ICD-10-CM | POA: Diagnosis not present

## 2014-09-09 LAB — HEPATIC FUNCTION PANEL
ALT: 44 U/L (ref 0–53)
AST: 33 U/L (ref 0–37)
Albumin: 4.4 g/dL (ref 3.5–5.2)
Alkaline Phosphatase: 60 U/L (ref 39–117)
BILIRUBIN DIRECT: 0.2 mg/dL (ref 0.0–0.3)
TOTAL PROTEIN: 7.3 g/dL (ref 6.0–8.3)
Total Bilirubin: 0.8 mg/dL (ref 0.2–1.2)

## 2014-09-09 LAB — BASIC METABOLIC PANEL
BUN: 17 mg/dL (ref 6–23)
CALCIUM: 9.4 mg/dL (ref 8.4–10.5)
CHLORIDE: 103 meq/L (ref 96–112)
CO2: 28 mEq/L (ref 19–32)
Creatinine, Ser: 1.2 mg/dL (ref 0.40–1.50)
GFR: 64.27 mL/min (ref >60.00)
GLUCOSE: 106 mg/dL — AB (ref 70–99)
POTASSIUM: 4.1 meq/L (ref 3.5–5.1)
SODIUM: 140 meq/L (ref 135–145)

## 2014-09-09 LAB — LIPID PANEL
Cholesterol: 113 mg/dL (ref 0–200)
HDL: 46.9 mg/dL (ref >39.00)
LDL Cholesterol: 35 mg/dL (ref 0–99)
NonHDL: 66.53
TRIGLYCERIDES: 160 mg/dL — AB (ref 0.0–149.0)
Total CHOL/HDL Ratio: 2
VLDL: 32 mg/dL (ref 0.0–40.0)

## 2014-09-09 LAB — HEMOGLOBIN A1C: HEMOGLOBIN A1C: 6.1 % (ref 4.6–6.5)

## 2014-09-14 ENCOUNTER — Ambulatory Visit (INDEPENDENT_AMBULATORY_CARE_PROVIDER_SITE_OTHER): Payer: Managed Care, Other (non HMO) | Admitting: Adult Health

## 2014-09-14 ENCOUNTER — Encounter: Payer: Self-pay | Admitting: Adult Health

## 2014-09-14 VITALS — BP 120/80 | Temp 98.4°F | Ht 68.25 in | Wt 196.3 lb

## 2014-09-14 DIAGNOSIS — E785 Hyperlipidemia, unspecified: Secondary | ICD-10-CM | POA: Diagnosis not present

## 2014-09-14 DIAGNOSIS — E139 Other specified diabetes mellitus without complications: Secondary | ICD-10-CM

## 2014-09-14 DIAGNOSIS — I10 Essential (primary) hypertension: Secondary | ICD-10-CM

## 2014-09-14 NOTE — Progress Notes (Signed)
Pre visit review using our clinic review tool, if applicable. No additional management support is needed unless otherwise documented below in the visit note. 

## 2014-09-14 NOTE — Patient Instructions (Signed)
It was great meeting you this morning! Your diabetes and cholesterol are well controlled currently.   Continue with therapy   Follow up with Dr. Sherren Mocha in November.   If you need anything in the meantime, please let me know.

## 2014-09-14 NOTE — Progress Notes (Signed)
Subjective:    Patient ID: Jacob Stone, male    DOB: 1951-01-18, 64 y.o.   MRN: [id number]  HPI Last saw Dr. Sherren Mocha on 07/07/2014 and was started on a statin for elevated LDL.   He is continuing to go to PT for his planter fasciitis. He is currently during steroid injections, he feels as though he his making some progress but that the progress is slow. He goes to therapy twice a week.   Review of Systems  Constitutional: Negative.   HENT: Negative.   Eyes: Negative.   Respiratory: Negative.   Cardiovascular: Negative.   Gastrointestinal: Negative.   Musculoskeletal: Negative.   Skin:       Pain in left heel from planter fascitis.   All other systems reviewed and are negative.  No past medical history on file.  History   Social History  . Marital Status: Married    Spouse Name: N/A  . Number of Children: N/A  . Years of Education: N/A   Occupational History  . Not on file.   Social History Main Topics  . Smoking status: Never Smoker   . Smokeless tobacco: Never Used  . Alcohol Use: Yes  . Drug Use: No  . Sexual Activity: Not on file   Other Topics Concern  . Not on file   Social History Narrative    Past Surgical History  Procedure Laterality Date  . Nephrectomy      left    No family history on file.  No Known Allergies  Current Outpatient Prescriptions on File Prior to Visit  Medication Sig Dispense Refill  . allopurinol (ZYLOPRIM) 300 MG tablet Take 1 tablet (300 mg total) by mouth daily. 100 tablet 3  . amLODipine (NORVASC) 10 MG tablet Take 1 tablet (10 mg total) by mouth daily. 90 tablet 3  . aspirin 325 MG tablet Take 325 mg by mouth daily.      . cetirizine (ZYRTEC) 10 MG tablet Take 10 mg by mouth daily.      . Docusate Calcium ([employer] STOOL SOFTENER PO) Take by mouth.      . fluorouracil (EFUDEX) 5 % cream   0  . hydrochlorothiazide (HYDRODIURIL) 12.5 MG tablet take 1 tablet by mouth once daily 90 tablet 0  . losartan (COZAAR) 25 MG tablet Take  1 tablet (25 mg total) by mouth daily. 100 tablet 3  . metFORMIN (GLUCOPHAGE) 500 MG tablet One half tab every morning 100 tablet 3  . Misc Natural Products (FIBER 7 PO) Take by mouth.    . simvastatin (ZOCOR) 20 MG tablet Take 1 tablet (20 mg total) by mouth at bedtime. 90 tablet 3  . simvastatin (ZOCOR) 20 MG tablet Take 1 tablet (20 mg total) by mouth at bedtime. 90 tablet 3  . Vitamin D, Ergocalciferol, (DRISDOL) 50000 UNITS CAPS capsule Take 50,000 Units by mouth every 7 (seven) days.     No current facility-administered medications on file prior to visit.    BP 120/80 mmHg  Temp(Src) 98.4 F (36.9 C) (Oral)  Ht 5' 8.25" (1.734 m)  Wt 196 lb 4.8 oz (89.041 kg)  BMI 29.61 kg/m2       Objective:   Physical Exam  Constitutional: He is oriented to person, place, and time. He appears well-developed and well-nourished. No distress.  Eyes: Right eye exhibits no discharge.  Cardiovascular: Normal rate, regular rhythm, normal heart sounds and intact distal pulses.  Exam reveals no gallop and no friction rub.  No murmur heard. Pulmonary/Chest: Effort normal and breath sounds normal. No respiratory distress. He has no wheezes. He has no rales. He exhibits no tenderness.  Musculoskeletal: Normal range of motion. He exhibits tenderness (left heel). He exhibits no edema.  Neurological: He is alert and oriented to person, place, and time.  Skin: Skin is warm and dry. No rash noted. He is not diaphoretic. No erythema. No pallor.  Psychiatric: He has a normal mood and affect. His behavior is normal. Judgment and thought content normal.  Nursing note and vitals reviewed.     Assessment & Plan:  1. Essential hypertension - Controlled - No change in medication.   2. Diabetes 1.5, managed as type 2 - Reviewed labs with patient.  - Lab Results  Component Value Date   HGBA1C 6.1 09/09/2014  continue with Metformin 250mg  BID - Follow up in November with MD Sherren Mocha   3. Hyperlipidemia -  Last LDL 35, this is down from 103. Dr. Sherren Mocha wanted him below 75 due to carotid artery calcification. Triglycerides have dropped from 227 to 160 - No change on Zocor at this time.

## 2014-09-21 ENCOUNTER — Other Ambulatory Visit: Payer: Self-pay | Admitting: Family Medicine

## 2014-12-07 LAB — HM DIABETES EYE EXAM

## 2014-12-14 ENCOUNTER — Ambulatory Visit (INDEPENDENT_AMBULATORY_CARE_PROVIDER_SITE_OTHER): Payer: Managed Care, Other (non HMO) | Admitting: Family Medicine

## 2014-12-14 ENCOUNTER — Encounter: Payer: Self-pay | Admitting: Family Medicine

## 2014-12-14 VITALS — BP 122/82 | HR 89 | Temp 98.7°F | Ht 68.25 in | Wt 202.4 lb

## 2014-12-14 DIAGNOSIS — J0100 Acute maxillary sinusitis, unspecified: Secondary | ICD-10-CM

## 2014-12-14 MED ORDER — AMOXICILLIN-POT CLAVULANATE 875-125 MG PO TABS: 1.0000 | ORAL_TABLET | Freq: Two times a day (BID) | ORAL | Status: DC

## 2014-12-14 NOTE — Progress Notes (Signed)
HPI:   Sinus congestion and pain: -started: chronic allergies, worsening sinus congestion and L paranasal sinus pain the last 3 days -denies:fever, SOB, NVD, tooth pain -has tried: his chronic oral antihistamine -sick contacts/travel/risks: denies flu exposure, tick exposure or or Ebola risks -Hx of: allergies  ROS: See pertinent positives and negatives per HPI.  No past medical history on file.  Past Surgical History  Procedure Laterality Date  . Nephrectomy      left    No family history on file.  Social History   Social History  . Marital Status: Married    Spouse Name: N/A  . Number of Children: N/A  . Years of Education: N/A   Social History Main Topics  . Smoking status: Never Smoker   . Smokeless tobacco: Never Used  . Alcohol Use: Yes  . Drug Use: No  . Sexual Activity: Not Asked   Other Topics Concern  . None   Social History Narrative     Current outpatient prescriptions:  .  allopurinol (ZYLOPRIM) 300 MG tablet, Take 1 tablet (300 mg total) by mouth daily., Disp: 100 tablet, Rfl: 3 .  amLODipine (NORVASC) 10 MG tablet, Take 1 tablet (10 mg total) by mouth daily., Disp: 90 tablet, Rfl: 3 .  aspirin 325 MG tablet, Take 325 mg by mouth daily.  , Disp: , Rfl:  .  cetirizine (ZYRTEC) 10 MG tablet, Take 10 mg by mouth daily.  , Disp: , Rfl:  .  Docusate Calcium ([employer]S STOOL SOFTENER PO), Take by mouth.  , Disp: , Rfl:  .  fluorouracil (EFUDEX) 5 % cream, , Disp: , Rfl: 0 .  hydrochlorothiazide (HYDRODIURIL) 12.5 MG tablet, take 1 tablet by mouth once daily, Disp: 90 tablet, Rfl: 0 .  losartan (COZAAR) 25 MG tablet, Take 1 tablet (25 mg total) by mouth daily., Disp: 100 tablet, Rfl: 3 .  metFORMIN (GLUCOPHAGE) 500 MG tablet, One half tab every morning, Disp: 100 tablet, Rfl: 3 .  Misc Natural Products (FIBER 7 PO), Take by mouth., Disp: , Rfl:  .  simvastatin (ZOCOR) 20 MG tablet, Take 1 tablet (20 mg total) by mouth at bedtime., Disp: 90 tablet, Rfl: 3 .   Vitamin D, Ergocalciferol, (DRISDOL) 50000 UNITS CAPS capsule, Take 50,000 Units by mouth every 7 (seven) days., Disp: , Rfl:  .  amoxicillin-clavulanate (AUGMENTIN) 875-125 MG tablet, Take 1 tablet by mouth 2 (two) times daily., Disp: 14 tablet, Rfl: 0  EXAM:  Filed Vitals:   12/14/14 1449  BP: 122/82  Pulse: 89  Temp: 98.7 F (37.1 C)    Body mass index is 30.53 kg/(m^2).  GENERAL: vitals reviewed and listed above, alert, oriented, appears well hydrated and in no acute distress  HEENT: atraumatic, conjunttiva clear, no obvious abnormalities on inspection of external nose and ears, normal appearance of ear canals and TMs, thick nasal congestion, mild post oropharyngeal erythema with PND, no tonsillar edema or exudate, L paranasal and max sinus TTP  NECK: no obvious masses on inspection  LUNGS: clear to auscultation bilaterally, no wheezes, rales or rhonchi, good air movement  CV: HRRR, no peripheral edema  MS: moves all extremities without noticeable abnormality  PSYCH: pleasant and cooperative, no obvious depression or anxiety  ASSESSMENT AND PLAN:  Discussed the following assessment and plan:  Acute maxillary sinusitis, recurrence not specified  We discussed potential etiologies, with VURI versus bacterial sinusitis being most likely. We discussed treatment side effects, likely course, antibiotic misuse, transmission, and signs of  developing a serious illness. Opted to do augmentin and follow up as needed. -of course, we advised to return or notify a doctor immediately if symptoms worsen or persist or new concerns arise.    There are no Patient Instructions on file for this visit.   Colin Benton R.

## 2014-12-14 NOTE — Progress Notes (Signed)
Pre visit review using our clinic review tool, if applicable. No additional management support is needed unless otherwise documented below in the visit note. 

## 2014-12-15 ENCOUNTER — Ambulatory Visit: Payer: Managed Care, Other (non HMO) | Admitting: Family Medicine

## 2014-12-20 ENCOUNTER — Other Ambulatory Visit: Payer: Self-pay | Admitting: Family Medicine

## 2014-12-29 ENCOUNTER — Encounter: Payer: Self-pay | Admitting: Family Medicine

## 2014-12-29 ENCOUNTER — Ambulatory Visit (INDEPENDENT_AMBULATORY_CARE_PROVIDER_SITE_OTHER): Payer: Managed Care, Other (non HMO) | Admitting: Family Medicine

## 2014-12-29 VITALS — BP 130/80 | Temp 98.5°F | Wt 205.0 lb

## 2014-12-29 DIAGNOSIS — E139 Other specified diabetes mellitus without complications: Secondary | ICD-10-CM | POA: Diagnosis not present

## 2014-12-29 NOTE — Patient Instructions (Signed)
Continue current medications  Focus on diet and exercise daily  Follow-up in February for your annual exam,,,,, you can do that with me or set up with Georgina Snell for your physical and then long-term care

## 2014-12-29 NOTE — Progress Notes (Signed)
Pre visit review using our clinic review tool, if applicable. No additional management support is needed unless otherwise documented below in the visit note. 

## 2014-12-29 NOTE — Progress Notes (Signed)
   Subjective:    Patient ID: Jacob Stone, male    DOB: [birth year], 64 y.o.   MRN: [id number]  HPI Jacob Stone is a 64 year old male nonsmoker who comes in today for follow-up of diabetes  His A1c is 6.1%. He's on metformin one half of a 500 mg tab daily  He finds it difficult exercise on a regular basis. BMI 30.6.  BP 130/80 on medication  Last physical exam February 2016   Review of Systems    review of systems otherwise negative recent eye exam showed no retinopathy Objective:   Physical Exam  Well-developed well-nourished male no acute distress vital signs stable he is afebrile      Assessment & Plan:  Diabetes type 2 at goal........ Continue current therapy follow-up in February for annual exam

## 2015-01-07 ENCOUNTER — Encounter: Payer: Self-pay | Admitting: Family Medicine

## 2015-03-16 ENCOUNTER — Encounter: Payer: Self-pay | Admitting: Family Medicine

## 2015-03-16 ENCOUNTER — Ambulatory Visit (INDEPENDENT_AMBULATORY_CARE_PROVIDER_SITE_OTHER): Payer: Managed Care, Other (non HMO) | Admitting: Family Medicine

## 2015-03-16 VITALS — BP 128/72 | HR 85 | Temp 97.7°F | Ht 68.25 in | Wt 202.9 lb

## 2015-03-16 DIAGNOSIS — J069 Acute upper respiratory infection, unspecified: Secondary | ICD-10-CM

## 2015-03-16 MED ORDER — HYDROCODONE-HOMATROPINE 5-1.5 MG/5ML PO SYRP: 5.0000 mL | ORAL_SOLUTION | Freq: Three times a day (TID) | ORAL | Status: DC | PRN

## 2015-03-16 NOTE — Progress Notes (Signed)
HPI:  URI -started: about 1 week ago -symptoms:nasal congestion, sore throat, cough -denies:fever, SOB, NVD, tooth pain, sinus pain -has tried: delsum -sick contacts/travel/risks: wife with the same and dx with sinusitis -reports codeine cough syrup has helped with similar issue in the past  ROS: See pertinent positives and negatives per HPI.  No past medical history on file.  Past Surgical History  Procedure Laterality Date  . Nephrectomy      left    No family history on file.  Social History   Social History  . Marital Status: Married    Spouse Name: N/A  . Number of Children: N/A  . Years of Education: N/A   Social History Main Topics  . Smoking status: Never Smoker   . Smokeless tobacco: Never Used  . Alcohol Use: Yes  . Drug Use: No  . Sexual Activity: Not Asked   Other Topics Concern  . None   Social History Narrative     Current outpatient prescriptions:  .  allopurinol (ZYLOPRIM) 300 MG tablet, Take 1 tablet (300 mg total) by mouth daily., Disp: 100 tablet, Rfl: 3 .  amLODipine (NORVASC) 10 MG tablet, Take 1 tablet (10 mg total) by mouth daily., Disp: 90 tablet, Rfl: 3 .  aspirin 325 MG tablet, Take 325 mg by mouth daily.  , Disp: , Rfl:  .  cetirizine (ZYRTEC) 10 MG tablet, Take 10 mg by mouth daily.  , Disp: , Rfl:  .  Docusate Calcium ([employer]S STOOL SOFTENER PO), Take by mouth.  , Disp: , Rfl:  .  hydrochlorothiazide (HYDRODIURIL) 12.5 MG tablet, take 1 tablet by mouth once daily, Disp: 90 tablet, Rfl: 0 .  losartan (COZAAR) 25 MG tablet, Take 1 tablet (25 mg total) by mouth daily., Disp: 100 tablet, Rfl: 3 .  metFORMIN (GLUCOPHAGE) 500 MG tablet, One half tab every morning, Disp: 100 tablet, Rfl: 3 .  Misc Natural Products (FIBER 7 PO), Take by mouth., Disp: , Rfl:  .  simvastatin (ZOCOR) 20 MG tablet, Take 1 tablet (20 mg total) by mouth at bedtime., Disp: 90 tablet, Rfl: 3 .  Vitamin D, Ergocalciferol, (DRISDOL) 50000 UNITS CAPS capsule, Take  50,000 Units by mouth every 7 (seven) days., Disp: , Rfl:  .  HYDROcodone-homatropine (HYCODAN) 5-1.5 MG/5ML syrup, Take 5 mLs by mouth every 8 (eight) hours as needed for cough., Disp: 120 mL, Rfl: 0  EXAM:  Filed Vitals:   03/16/15 1623  BP: 128/72  Pulse: 85  Temp: 97.7 F (36.5 C)    Body mass index is 30.61 kg/(m^2).  GENERAL: vitals reviewed and listed above, alert, oriented, appears well hydrated and in no acute distress  HEENT: atraumatic, conjunttiva clear, no obvious abnormalities on inspection of external nose and ears, normal appearance of ear canals and TMs, clear nasal congestion, mildly deviated septum, mild post oropharyngeal erythema with PND, no tonsillar edema or exudate, no sinus TTP  NECK: no obvious masses on inspection  LUNGS: clear to auscultation bilaterally, no wheezes, rales or rhonchi, good air movement  CV: HRRR, no peripheral edema  MS: moves all extremities without noticeable abnormality  PSYCH: pleasant and cooperative, no obvious depression or anxiety  ASSESSMENT AND PLAN:  Discussed the following assessment and plan:  Acute upper respiratory infection  -given HPI and exam findings today, a serious infection or illness is unlikely. We discussed potential etiologies, with VURI being most likely, and advised supportive care and monitoring. We discussed treatment side effects, likely course, antibiotic misuse,  transmission, and signs of developing a serious illness. -of course, we advised to return or notify a doctor immediately if symptoms worsen or persist or new concerns arise.    Patient Instructions  INSTRUCTIONS FOR UPPER RESPIRATORY INFECTION:  -plenty of rest and fluids  -nasal saline wash 2-3 times daily (use prepackaged nasal saline or bottled/distilled water if making your own)   -can use AFRIN nasal spray for drainage and nasal congestion - but do NOT use longer then 3-4 days  -can use tylenol (in no history of liver disease)  or ibuprofen (if no history of kidney disease, bowel bleeding or significant heart disease) as directed for aches and sorethroat  -in the winter time, using a humidifier at night is helpful (please follow cleaning instructions)  -if you are taking a cough medication - use only as directed, may also try a teaspoon of honey to coat the throat and throat lozenges. If given a cough medication with codeine or hydrocodone or other narcotic please be advised that this contains a strong and  potentially addicting medication. Please follow instructions carefully, take as little as possible and only use AS NEEDED for severe cough. Discuss potential side effects with your pharmacy. Please do not drive or operate machinery while taking these types of medications. Please do not take other sedating medications, drugs or alcohol while taking this medication without discussing with your doctor.  -for sore throat, salt water gargles can help  -follow up if you have fevers, facial pain, tooth pain, difficulty breathing or are worsening or symptoms persist longer then expected  Upper Respiratory Infection, Adult An upper respiratory infection (URI) is also known as the common cold. It is often caused by a type of germ (virus). Colds are easily spread (contagious). You can pass it to others by kissing, coughing, sneezing, or drinking out of the same glass. Usually, you get better in 1 to 3  weeks.  However, the cough can last for even longer. HOME CARE   Only take medicine as told by your doctor. Follow instructions provided above.  Drink enough water and fluids to keep your pee (urine) clear or pale yellow.  Get plenty of rest.  Return to work when your temperature is < 100 for 24 hours or as told by your doctor. You may use a face mask and wash your hands to stop your cold from spreading. GET HELP RIGHT AWAY IF:   After the first few days, you feel you are getting worse.  You have questions about your  medicine.  You have chills, shortness of breath, or red spit (mucus).  You have pain in the face for more then 1-2 days, especially when you bend forward.  You have a fever, puffy (swollen) neck, pain when you swallow, or white spots in the back of your throat.  You have a bad headache, ear pain, sinus pain, or chest pain.  You have a high-pitched whistling sound when you breathe in and out (wheezing).  You cough up blood.  You have sore muscles or a stiff neck. MAKE SURE YOU:   Understand these instructions.  Will watch your condition.  Will get help right away if you are not doing well or get worse. Document Released: 07/12/2007 Document Revised: 04/17/2011 Document Reviewed: 04/30/2013 [facility] Patient Information 2015 [place], [place]. This information is not intended to replace advice given to you by your health care provider. Make sure you discuss any questions you have with your health care provider.  Colin Benton R.

## 2015-03-16 NOTE — Patient Instructions (Signed)

## 2015-03-16 NOTE — Progress Notes (Signed)
Pre visit review using our clinic review tool, if applicable. No additional management support is needed unless otherwise documented below in the visit note. 

## 2015-03-17 ENCOUNTER — Other Ambulatory Visit: Payer: Self-pay | Admitting: Family Medicine

## 2015-03-19 ENCOUNTER — Other Ambulatory Visit (INDEPENDENT_AMBULATORY_CARE_PROVIDER_SITE_OTHER): Payer: Managed Care, Other (non HMO)

## 2015-03-19 DIAGNOSIS — E139 Other specified diabetes mellitus without complications: Secondary | ICD-10-CM

## 2015-03-19 LAB — MICROALBUMIN / CREATININE URINE RATIO
CREATININE, U: 156.3 mg/dL
MICROALB UR: 19.6 mg/dL — AB (ref 0.0–1.9)
MICROALB/CREAT RATIO: 12.5 mg/g (ref 0.0–30.0)

## 2015-03-19 LAB — POCT URINALYSIS DIPSTICK
BILIRUBIN UA: NEGATIVE
GLUCOSE UA: NEGATIVE
KETONES UA: NEGATIVE
Leukocytes, UA: NEGATIVE
Nitrite, UA: NEGATIVE
SPEC GRAV UA: 1.02
Urobilinogen, UA: 0.2
pH, UA: 6

## 2015-03-19 LAB — LIPID PANEL
CHOLESTEROL: 124 mg/dL (ref 0–200)
HDL: 43.1 mg/dL (ref >39.00)
LDL Cholesterol: 42 mg/dL (ref 0–99)
NONHDL: 80.59
Total CHOL/HDL Ratio: 3
Triglycerides: 195 mg/dL — ABNORMAL HIGH (ref 0.0–149.0)
VLDL: 39 mg/dL (ref 0.0–40.0)

## 2015-03-19 LAB — HEPATIC FUNCTION PANEL
ALBUMIN: 4.4 g/dL (ref 3.5–5.2)
ALK PHOS: 68 U/L (ref 39–117)
ALT: 83 U/L — ABNORMAL HIGH (ref 0–53)
AST: 56 U/L — AB (ref 0–37)
BILIRUBIN TOTAL: 0.9 mg/dL (ref 0.2–1.2)
Bilirubin, Direct: 0.2 mg/dL (ref 0.0–0.3)
Total Protein: 7.6 g/dL (ref 6.0–8.3)

## 2015-03-19 LAB — BASIC METABOLIC PANEL
BUN: 21 mg/dL (ref 6–23)
CALCIUM: 9.9 mg/dL (ref 8.4–10.5)
CHLORIDE: 103 meq/L (ref 96–112)
CO2: 33 mEq/L — ABNORMAL HIGH (ref 19–32)
CREATININE: 1.34 mg/dL (ref 0.40–1.50)
GFR: 56.5 mL/min — AB (ref >60.00)
Glucose, Bld: 151 mg/dL — ABNORMAL HIGH (ref 70–99)
Potassium: 5.5 mEq/L — ABNORMAL HIGH (ref 3.5–5.1)
Sodium: 143 mEq/L (ref 135–145)

## 2015-03-19 LAB — PSA: PSA: 3.38 ng/mL (ref 0.10–4.00)

## 2015-03-19 LAB — TSH: TSH: 2.61 u[IU]/mL (ref 0.35–4.50)

## 2015-03-19 LAB — HEMOGLOBIN A1C: HEMOGLOBIN A1C: 6.5 % (ref 4.6–6.5)

## 2015-03-20 LAB — CBC WITH DIFFERENTIAL/PLATELET
BASOS ABS: 0 10*3/uL (ref 0.0–0.1)
Basophils Relative: 0.6 % (ref 0.0–3.0)
EOS PCT: 8.3 % — AB (ref 0.0–5.0)
Eosinophils Absolute: 0.6 10*3/uL (ref 0.0–0.7)
HCT: 51.5 % (ref 39.0–52.0)
HEMOGLOBIN: 16.3 g/dL (ref 13.0–17.0)
Lymphocytes Relative: 28.8 % (ref 12.0–46.0)
Lymphs Abs: 2 10*3/uL (ref 0.7–4.0)
MCHC: 31.7 g/dL (ref 30.0–36.0)
MCV: 98.5 fl (ref 78.0–100.0)
MONO ABS: 0.6 10*3/uL (ref 0.1–1.0)
MONOS PCT: 9.1 % (ref 3.0–12.0)
Neutro Abs: 3.6 10*3/uL (ref 1.4–7.7)
Neutrophils Relative %: 53.2 % (ref 43.0–77.0)
Platelets: 282 10*3/uL (ref 150.0–400.0)
RBC: 5.23 Mil/uL (ref 4.22–5.81)
RDW: 14.1 % (ref 11.5–15.5)
WBC: 6.8 10*3/uL (ref 4.0–10.5)

## 2015-03-29 ENCOUNTER — Encounter: Payer: Self-pay | Admitting: Adult Health

## 2015-03-29 ENCOUNTER — Ambulatory Visit (INDEPENDENT_AMBULATORY_CARE_PROVIDER_SITE_OTHER): Payer: Managed Care, Other (non HMO) | Admitting: Adult Health

## 2015-03-29 VITALS — BP 122/80 | Temp 98.2°F | Ht 68.25 in | Wt 204.3 lb

## 2015-03-29 DIAGNOSIS — E785 Hyperlipidemia, unspecified: Secondary | ICD-10-CM | POA: Diagnosis not present

## 2015-03-29 DIAGNOSIS — Z0000 Encounter for general adult medical examination without abnormal findings: Secondary | ICD-10-CM | POA: Diagnosis not present

## 2015-03-29 DIAGNOSIS — E139 Other specified diabetes mellitus without complications: Secondary | ICD-10-CM

## 2015-03-29 DIAGNOSIS — E875 Hyperkalemia: Secondary | ICD-10-CM

## 2015-03-29 DIAGNOSIS — I10 Essential (primary) hypertension: Secondary | ICD-10-CM

## 2015-03-29 DIAGNOSIS — J0111 Acute recurrent frontal sinusitis: Secondary | ICD-10-CM

## 2015-03-29 LAB — BASIC METABOLIC PANEL
BUN: 19 mg/dL (ref 6–23)
CHLORIDE: 103 meq/L (ref 96–112)
CO2: 30 mEq/L (ref 19–32)
Calcium: 10.1 mg/dL (ref 8.4–10.5)
Creatinine, Ser: 1.24 mg/dL (ref 0.40–1.50)
GFR: 61.78 mL/min (ref >60.00)
GLUCOSE: 112 mg/dL — AB (ref 70–99)
POTASSIUM: 4.5 meq/L (ref 3.5–5.1)
Sodium: 143 mEq/L (ref 135–145)

## 2015-03-29 MED ORDER — DOXYCYCLINE HYCLATE 100 MG PO CAPS: 100.0000 mg | ORAL_CAPSULE | Freq: Two times a day (BID) | ORAL | Status: DC

## 2015-03-29 NOTE — Progress Notes (Addendum)
Subjective:    Patient ID: Jacob Stone, male    DOB: 1951-01-10, 65 y.o.   MRN: [id number]  HPI  Patient presents for yearly preventative medicine examination. Medicare questionnaire was completed  All immunizations and health maintenance protocols were reviewed with the patient and needed orders were placed.  Medication reconciliation,  past medical history, social history, problem list and allergies were reviewed in detail with the patient  Goals were established with regard to weight loss, exercise, and  diet in compliance with medications  End of life planning was discussed. He does not have an advanced directive or living will.   Acute complaint - Sinusitis. Symptoms started about 4 weeks ago. Saw Dr. Maudie Mercury three weeks ago and was diagnosed with viral URI. He continues to have sinus pain and pressure and nasal drainage, with PND and non productive cough.    Review of Systems  Constitutional: Negative.   HENT: Positive for postnasal drip, rhinorrhea and sinus pressure.   Eyes: Negative.   Respiratory: Positive for cough. Negative for shortness of breath.   Cardiovascular: Negative.   Gastrointestinal: Negative.   Endocrine: Negative.   Genitourinary: Negative.   Musculoskeletal: Negative.   Skin: Negative.   Allergic/Immunologic: Negative.   Neurological: Negative.   Hematological: Negative.   Psychiatric/Behavioral: Negative.   All other systems reviewed and are negative.  Past Medical History  Diagnosis Date  . Diverticulitis   . HTN (hypertension)   . Seasonal allergies   . Gout   . Carotid artery calcification   . Hyperlipidemia   . Diabetes type 2, controlled ([place])   . Malignant neoplasm of kidney excluding renal pelvis [facility])     Social History   Social History  . Marital Status: Married    Spouse Name: N/A  . Number of Children: N/A  . Years of Education: N/A   Occupational History  . Not on file.   Social History Main Topics  . Smoking  status: Never Smoker   . Smokeless tobacco: Never Used  . Alcohol Use: Yes  . Drug Use: No  . Sexual Activity: Not on file   Other Topics Concern  . Not on file   Social History Narrative    Past Surgical History  Procedure Laterality Date  . Nephrectomy      left    No family history on file.  No Known Allergies  Current Outpatient Prescriptions on File Prior to Visit  Medication Sig Dispense Refill  . allopurinol (ZYLOPRIM) 300 MG tablet Take 1 tablet (300 mg total) by mouth daily. 100 tablet 3  . amLODipine (NORVASC) 10 MG tablet take 1 tablet by mouth once daily 90 tablet 0  . aspirin 325 MG tablet Take 325 mg by mouth daily.      . cetirizine (ZYRTEC) 10 MG tablet Take 10 mg by mouth daily.      . Docusate Calcium ([employer]S STOOL SOFTENER PO) Take by mouth.      . hydrochlorothiazide (HYDRODIURIL) 12.5 MG tablet take 1 tablet by mouth once daily 90 tablet 0  . losartan (COZAAR) 25 MG tablet take 1 tablet by mouth once daily 100 tablet 0  . metFORMIN (GLUCOPHAGE) 500 MG tablet One half tab every morning 100 tablet 3  . Misc Natural Products (FIBER 7 PO) Take by mouth.    . simvastatin (ZOCOR) 20 MG tablet Take 1 tablet (20 mg total) by mouth at bedtime. 90 tablet 3  . Vitamin D, Ergocalciferol, (DRISDOL) 50000 UNITS CAPS capsule  Take 50,000 Units by mouth every 7 (seven) days.     No current facility-administered medications on file prior to visit.    BP 122/80 mmHg  Temp(Src) 98.2 F (36.8 C) (Oral)  Ht 5' 8.25" (1.734 m)  Wt 204 lb 4.8 oz (92.67 kg)  BMI 30.82 kg/m2       Objective:   Physical Exam  Constitutional: He is oriented to person, place, and time. He appears well-developed and well-nourished. No distress.  HENT:  Head: Normocephalic and atraumatic.  Right Ear: External ear normal.  Left Ear: External ear normal.  Mouth/Throat: Oropharynx is clear and moist. No oropharyngeal exudate.  Eyes: Conjunctivae and EOM are normal. Pupils are equal, round,  and reactive to light. Right eye exhibits no discharge. Left eye exhibits no discharge. No scleral icterus.  Neck: Normal range of motion. Neck supple. No JVD present. No tracheal deviation present. No thyromegaly present.  Cardiovascular: Normal rate, regular rhythm, normal heart sounds and intact distal pulses.  Exam reveals no gallop and no friction rub.   No murmur heard. Pulmonary/Chest: Effort normal and breath sounds normal. No stridor. No respiratory distress. He has no wheezes. He has no rales. He exhibits no tenderness.  Abdominal: Soft. Bowel sounds are normal. He exhibits no distension and no mass. There is no tenderness. There is no rebound and no guarding.  Genitourinary: Rectum normal and prostate normal. Guaiac negative stool. No penile tenderness.  Musculoskeletal: Normal range of motion. He exhibits no edema or tenderness.  Lymphadenopathy:    He has no cervical adenopathy.  Neurological: He is alert and oriented to person, place, and time.  Skin: Skin is warm and dry. No rash noted. He is not diaphoretic. No erythema. No pallor.  Psychiatric: He has a normal mood and affect. His behavior is normal. Judgment and thought content normal.  Nursing note and vitals reviewed.      Assessment & Plan:  1. Essential hypertension - EKG 12-Lead- NSR, Rate 70 BP: 122/80 mmHg  - Controlled with current medication  2. Diabetes 1.5, managed as type 2 ([place]) - Last A1c 6.5. He is controlled on Metformin 500mg    3. Hyperlipidemia - EKG 12-Lead - Total Cholesterol ok, triglycerides elevated this visit.  4. Routine general medical examination at a health care facility - Reviewed labs in detials - EKG 12-Lead  5. Hyperkalemia - Potassium of 5.5 during CPE labs. Will recheck today.  - Basic metabolic panel  6. Acute recurrent frontal sinusitis - doxycycline (VIBRAMYCIN) 100 MG capsule; Take 1 capsule (100 mg total) by mouth 2 (two) times daily.  Dispense: 14 capsule; Refill:  0

## 2015-03-29 NOTE — Progress Notes (Signed)
Pre visit review using our clinic review tool, if applicable. No additional management support is needed unless otherwise documented below in the visit note. 

## 2015-03-29 NOTE — Patient Instructions (Addendum)
It was great seeing you again today!  I have sent in a prescription for Doxycycline, take this twice a day for 7 days. You can also use Flonase to help with the sinus pain and pressure.   Work on diet and exercise.   Follow up in one year for your next physical or sooner if needed  Health Maintenance, Male A healthy lifestyle and preventative care can promote health and wellness.  Maintain regular health, dental, and eye exams.  Eat a healthy diet. Foods like vegetables, fruits, whole grains, low-fat dairy products, and lean protein foods contain the nutrients you need and are low in calories. Decrease your intake of foods high in solid fats, added sugars, and salt. Get information about a proper diet from your health care provider, if necessary.  Regular physical exercise is one of the most important things you can do for your health. Most adults should get at least 150 minutes of moderate-intensity exercise (any activity that increases your heart rate and causes you to sweat) each week. In addition, most adults need muscle-strengthening exercises on 2 or more days a week.   Maintain a healthy weight. The body mass index (BMI) is a screening tool to identify possible weight problems. It provides an estimate of body fat based on height and weight. Your health care provider can find your BMI and can help you achieve or maintain a healthy weight. For males 20 years and older:  A BMI below 18.5 is considered underweight.  A BMI of 18.5 to 24.9 is normal.  A BMI of 25 to 29.9 is considered overweight.  A BMI of 30 and above is considered obese.  Maintain normal blood lipids and cholesterol by exercising and minimizing your intake of saturated fat. Eat a balanced diet with plenty of fruits and vegetables. Blood tests for lipids and cholesterol should begin at age 83 and be repeated every 5 years. If your lipid or cholesterol levels are high, you are over age 54, or you are at high risk for  heart disease, you may need your cholesterol levels checked more frequently.Ongoing high lipid and cholesterol levels should be treated with medicines if diet and exercise are not working.  If you smoke, find out from your health care provider how to quit. If you do not use tobacco, do not start.  Lung cancer screening is recommended for adults aged 22-80 years who are at high risk for developing lung cancer because of a history of smoking. A yearly low-dose CT scan of the lungs is recommended for people who have at least a 30-pack-year history of smoking and are current smokers or have quit within the past 15 years. A pack year of smoking is smoking an average of 1 pack of cigarettes a day for 1 year (for example, a 30-pack-year history of smoking could mean smoking 1 pack a day for 30 years or 2 packs a day for 15 years). Yearly screening should continue until the smoker has stopped smoking for at least 15 years. Yearly screening should be stopped for people who develop a health problem that would prevent them from having lung cancer treatment.  If you choose to drink alcohol, do not have more than 2 drinks per day. One drink is considered to be 12 oz (360 mL) of beer, 5 oz (150 mL) of wine, or 1.5 oz (45 mL) of liquor.  Avoid the use of street drugs. Do not share needles with anyone. Ask for help if you need support  or instructions about stopping the use of drugs.  High blood pressure causes heart disease and increases the risk of stroke. High blood pressure is more likely to develop in:  People who have blood pressure in the end of the normal range (100-139/85-89 mm Hg).  People who are overweight or obese.  People who are African American.  If you are 3-69 years of age, have your blood pressure checked every 3-5 years. If you are 41 years of age or older, have your blood pressure checked every year. You should have your blood pressure measured twice--once when you are at a hospital or  clinic, and once when you are not at a hospital or clinic. Record the average of the two measurements. To check your blood pressure when you are not at a hospital or clinic, you can use:  An automated blood pressure machine at a pharmacy.  A home blood pressure monitor.  If you are 70-57 years old, ask your health care provider if you should take aspirin to prevent heart disease.  Diabetes screening involves taking a blood sample to check your fasting blood sugar level. This should be done once every 3 years after age 25 if you are at a normal weight and without risk factors for diabetes. Testing should be considered at a younger age or be carried out more frequently if you are overweight and have at least 1 risk factor for diabetes.  Colorectal cancer can be detected and often prevented. Most routine colorectal cancer screening begins at the age of 47 and continues through age 60. However, your health care provider may recommend screening at an earlier age if you have risk factors for colon cancer. On a yearly basis, your health care provider may provide home test kits to check for hidden blood in the stool. A small camera at the end of a tube may be used to directly examine the colon (sigmoidoscopy or colonoscopy) to detect the earliest forms of colorectal cancer. Talk to your health care provider about this at age 29 when routine screening begins. A direct exam of the colon should be repeated every 5-10 years through age 33, unless early forms of precancerous polyps or small growths are found.  People who are at an increased risk for hepatitis B should be screened for this virus. You are considered at high risk for hepatitis B if:  You were born in a country where hepatitis B occurs often. Talk with your health care provider about which countries are considered high risk.  Your parents were born in a high-risk country and you have not received a shot to protect against hepatitis B (hepatitis B  vaccine).  You have HIV or AIDS.  You use needles to inject street drugs.  You live with, or have sex with, someone who has hepatitis B.  You are a man who has sex with other men (MSM).  You get hemodialysis treatment.  You take certain medicines for conditions like cancer, organ transplantation, and autoimmune conditions.  Hepatitis C blood testing is recommended for all people born from [birth year] through 1967 and any individual with known risk factors for hepatitis C.  Healthy men should no longer receive prostate-specific antigen (PSA) blood tests as part of routine cancer screening. Talk to your health care provider about prostate cancer screening.  Testicular cancer screening is not recommended for adolescents or adult males who have no symptoms. Screening includes self-exam, a health care provider exam, and other screening tests. Consult with your health  care provider about any symptoms you have or any concerns you have about testicular cancer.  Practice safe sex. Use condoms and avoid high-risk sexual practices to reduce the spread of sexually transmitted infections (STIs).  You should be screened for STIs, including gonorrhea and chlamydia if:  You are sexually active and are younger than 24 years.  You are older than 24 years, and your health care provider tells you that you are at risk for this type of infection.  Your sexual activity has changed since you were last screened, and you are at an increased risk for chlamydia or gonorrhea. Ask your health care provider if you are at risk.  If you are at risk of being infected with HIV, it is recommended that you take a prescription medicine daily to prevent HIV infection. This is called pre-exposure prophylaxis (PrEP). You are considered at risk if:  You are a man who has sex with other men (MSM).  You are a heterosexual man who is sexually active with multiple partners.  You take drugs by injection.  You are sexually active  with a partner who has HIV.  Talk with your health care provider about whether you are at high risk of being infected with HIV. If you choose to begin PrEP, you should first be tested for HIV. You should then be tested every 3 months for as long as you are taking PrEP.  Use sunscreen. Apply sunscreen liberally and repeatedly throughout the day. You should seek shade when your shadow is shorter than you. Protect yourself by wearing long sleeves, pants, a wide-brimmed hat, and sunglasses year round whenever you are outdoors.  Tell your health care provider of new moles or changes in moles, especially if there is a change in shape or color. Also, tell your health care provider if a mole is larger than the size of a pencil eraser.  A one-time screening for abdominal aortic aneurysm (AAA) and surgical repair of large AAAs by ultrasound is recommended for men aged 69-75 years who are current or former smokers.  Stay current with your vaccines (immunizations).   This information is not intended to replace advice given to you by your health care provider. Make sure you discuss any questions you have with your health care provider.   Document Released: 07/22/2007 Document Revised: 02/13/2014 Document Reviewed: 06/20/2010 Elsevier Interactive Patient Education [employer].

## 2015-04-28 ENCOUNTER — Other Ambulatory Visit: Payer: Self-pay | Admitting: Family Medicine

## 2015-06-14 ENCOUNTER — Other Ambulatory Visit: Payer: Self-pay | Admitting: Family Medicine

## 2015-06-16 ENCOUNTER — Other Ambulatory Visit: Payer: Self-pay | Admitting: Family Medicine

## 2015-07-25 ENCOUNTER — Other Ambulatory Visit: Payer: Self-pay | Admitting: Family Medicine

## 2015-07-28 ENCOUNTER — Other Ambulatory Visit: Payer: Self-pay | Admitting: Family Medicine

## 2015-09-18 ENCOUNTER — Other Ambulatory Visit: Payer: Self-pay | Admitting: Family Medicine

## 2015-10-08 ENCOUNTER — Other Ambulatory Visit: Payer: Self-pay | Admitting: Orthopedic Surgery

## 2015-10-08 DIAGNOSIS — M7662 Achilles tendinitis, left leg: Secondary | ICD-10-CM

## 2015-12-16 ENCOUNTER — Other Ambulatory Visit: Payer: Self-pay | Admitting: Family Medicine

## 2016-01-07 ENCOUNTER — Ambulatory Visit (INDEPENDENT_AMBULATORY_CARE_PROVIDER_SITE_OTHER): Payer: Managed Care, Other (non HMO) | Admitting: Adult Health

## 2016-01-07 ENCOUNTER — Encounter: Payer: Self-pay | Admitting: Adult Health

## 2016-01-07 VITALS — BP 160/72 | Temp 97.8°F | Ht 68.25 in | Wt 207.9 lb

## 2016-01-07 DIAGNOSIS — J0141 Acute recurrent pansinusitis: Secondary | ICD-10-CM | POA: Diagnosis not present

## 2016-01-07 MED ORDER — DOXYCYCLINE HYCLATE 100 MG PO CAPS: 100.0000 mg | ORAL_CAPSULE | Freq: Two times a day (BID) | ORAL | 0 refills | Status: DC

## 2016-01-07 NOTE — Progress Notes (Signed)
Subjective:    Patient ID: Jacob Stone, male    DOB: 1950/08/31, 65 y.o.   MRN: [id number]  Sinusitis  This is a new problem. The current episode started in the past 7 days. There has been no fever. Associated symptoms include congestion, coughing and sinus pressure. Pertinent negatives include no chills, diaphoresis, ear pain, headaches, shortness of breath or sore throat. Past treatments include saline sprays and spray decongestants. The treatment provided no relief.    Review of Systems  Constitutional: Positive for activity change and fatigue. Negative for chills, diaphoresis and fever.  HENT: Positive for congestion, postnasal drip, rhinorrhea, sinus pain and sinus pressure. Negative for ear pain, sore throat and tinnitus.   Eyes: Negative.   Respiratory: Positive for cough. Negative for shortness of breath.   Cardiovascular: Negative.   Gastrointestinal: Negative.   Musculoskeletal: Negative.   Neurological: Negative for headaches.  Psychiatric/Behavioral: Positive for sleep disturbance.   Past Medical History:  Diagnosis Date  . Carotid artery calcification   . Diabetes type 2, controlled ([place])   . Diverticulitis   . Gout   . HTN (hypertension)   . Hyperlipidemia   . Malignant neoplasm of kidney excluding renal pelvis ([place])   . Seasonal allergies     Social History   Social History  . Marital status: Married    Spouse name: N/A  . Number of children: N/A  . Years of education: N/A   Occupational History  . Not on file.   Social History Main Topics  . Smoking status: Never Smoker  . Smokeless tobacco: Never Used  . Alcohol use Yes  . Drug use: No  . Sexual activity: Not on file   Other Topics Concern  . Not on file   Social History Narrative  . No narrative on file    Past Surgical History:  Procedure Laterality Date  . NEPHRECTOMY     left    No family history on file.  No Known Allergies  Current Outpatient Prescriptions on File Prior to  Visit  Medication Sig Dispense Refill  . allopurinol (ZYLOPRIM) 300 MG tablet take 1 tablet by mouth once daily 100 tablet 3  . amLODipine (NORVASC) 10 MG tablet take 1 tablet by mouth once daily 90 tablet 1  . aspirin 325 MG tablet Take 325 mg by mouth daily.      . cetirizine (ZYRTEC) 10 MG tablet Take 10 mg by mouth daily.      . Docusate Calcium ([employer] STOOL SOFTENER PO) Take by mouth.      . hydrochlorothiazide (HYDRODIURIL) 12.5 MG tablet take 1 tablet by mouth once daily 90 tablet 0  . losartan (COZAAR) 25 MG tablet take 1 tablet by mouth once daily 30 tablet 5  . metFORMIN (GLUCOPHAGE) 500 MG tablet take 1/2 tablet by mouth every morning 100 tablet 3  . Misc Natural Products (FIBER 7 PO) Take by mouth.    . simvastatin (ZOCOR) 20 MG tablet take 1 tablet by mouth at bedtime 90 tablet 2  . Vitamin D, Ergocalciferol, (DRISDOL) 50000 UNITS CAPS capsule Take 50,000 Units by mouth every 7 (seven) days.     No current facility-administered medications on file prior to visit.     BP (!) 160/72   Temp 97.8 F (36.6 C) (Oral)   Ht 5' 8.25" (1.734 m)   Wt 207 lb 14.4 oz (94.3 kg)   BMI 31.38 kg/m       Objective:   Physical Exam  Constitutional: He is oriented to person, place, and time. He appears well-developed and well-nourished. No distress.  HENT:  Head: Normocephalic and atraumatic.  Right Ear: Hearing, tympanic membrane, external ear and ear canal normal.  Left Ear: Hearing, tympanic membrane and external ear normal.  Nose: Mucosal edema and rhinorrhea present. Right sinus exhibits no maxillary sinus tenderness and no frontal sinus tenderness. Left sinus exhibits maxillary sinus tenderness and frontal sinus tenderness.  Mouth/Throat: Uvula is midline and mucous membranes are normal. Oropharyngeal exudate present.  Cardiovascular: Normal rate, regular rhythm, normal heart sounds and intact distal pulses.  Exam reveals no gallop and no friction rub.   No murmur  heard. Pulmonary/Chest: Effort normal and breath sounds normal. No respiratory distress. He has no wheezes. He has no rales. He exhibits no tenderness.  Neurological: He is alert and oriented to person, place, and time.  Skin: Skin is warm and dry. No rash noted. He is not diaphoretic. No erythema. No pallor.  Psychiatric: He has a normal mood and affect. His behavior is normal. Judgment and thought content normal.  Nursing note and vitals reviewed.     Assessment & Plan:  1. Acute recurrent pansinusitis - doxycycline (VIBRAMYCIN) 100 MG capsule; Take 1 capsule (100 mg total) by mouth 2 (two) times daily.  Dispense: 20 capsule; Refill: 0 - Continue to use Flonase and Normal Saline spray  - Follow up if no improvement  Dorothyann Peng, NP

## 2016-01-14 ENCOUNTER — Other Ambulatory Visit: Payer: Self-pay | Admitting: Adult Health

## 2016-01-17 ENCOUNTER — Encounter: Payer: Self-pay | Admitting: Family Medicine

## 2016-01-17 ENCOUNTER — Ambulatory Visit (INDEPENDENT_AMBULATORY_CARE_PROVIDER_SITE_OTHER): Payer: Managed Care, Other (non HMO) | Admitting: Family Medicine

## 2016-01-17 VITALS — BP 134/82 | HR 81 | Temp 98.3°F | Wt 203.1 lb

## 2016-01-17 DIAGNOSIS — K2100 Gastro-esophageal reflux disease with esophagitis, without bleeding: Secondary | ICD-10-CM

## 2016-01-17 DIAGNOSIS — G478 Other sleep disorders: Secondary | ICD-10-CM

## 2016-01-17 MED ORDER — LORAZEPAM 1 MG PO TABS: ORAL_TABLET | ORAL | 3 refills | Status: DC

## 2016-01-17 MED ORDER — OMEPRAZOLE 20 MG PO CPDR: 20.0000 mg | DELAYED_RELEASE_CAPSULE | Freq: Every day | ORAL | 3 refills | Status: DC

## 2016-01-17 NOTE — Progress Notes (Signed)
Pre visit review using our clinic review tool, if applicable. No additional management support is needed unless otherwise documented below in the visit note. 

## 2016-01-17 NOTE — Patient Instructions (Signed)
Ativan 1 mg............Marland Kitchen 1/2-1 tablet at bedtime when necessary  We will put in a request for a consult and pulmonary to evaluate the possibility of sleep apnea  Carbohydrate free diet.......... walk 30 minutes at noon  No caffeine  Prilosec 20 mg.......... one before breakfast..... One before your evening male. Call your gastroenterologist for further evaluation because of the possibility of a lower esophageal stricture  Plain Zyrtec.......Marland Kitchen 1 at bedtime  Steroid nasal spray........Marland Kitchen 1 shot up each nostril at bedtime

## 2016-01-17 NOTE — Progress Notes (Signed)
Jacob Stone is a 65 year old married male nonsmoker who comes in today for evaluation of 2 issues  He's had a history of snoring for many years. Over the last couple years it's gotten worse. His BMI is 31.40. 6 months ago began having sleep dysfunction which is characterized by going to sleep okay around 9 or 10 PM but then waking up at midnight 1:00 in the morning and can't go back to sleep. He states he really consumes 1 or 2 caffeinated beverages per day. In the past he was exercising the evening. He is not doing that now because he's been working until 6 or 7:00 at night. He denies any stress or anxiety. 20 years ago he had a sleep apnea test because of similar symptoms which was borderline.  He also has allergic rhinitis for which he takes plain Zyrtec daily. Advised not to take pseudoephedrine.  He's also had a history of reflux esophagitis. Last Tuesday had an episode that lasted for about 3 days. He took some Tums. He points to an area of his lower esophagus. He says this areas were food especially rice seems to get stuck. He had a colonoscopy at [place] 5 years ago.  Weight is up to 203 pounds.  ...BP 134/82 (BP Location: Left Arm, Patient Position: Sitting, Cuff Size: Normal)   Pulse 81   Temp 98.3 F (36.8 C) (Oral)   Wt 203 lb 1.6 oz (92.1 kg)   BMI 30.66 kg/m  Examination HEENT is negative except for bilateral nasal edema. Septum is in the midline. Oral cavity normal big jaws  Impression  #1. Sleep dysfunction.......Marland Kitchen begin workup to determine etiology. Start with a caffeine free diet, exercise in the morning, pulmonary consult rule out sleep apnea, weight loss. Also give low-dose Ativan to help with sleep dysfunction  Explained this is not a long-term solution but temporary  #2 reflux esophagitis with symptoms consistent with lower esophageal stenosis........ instructed on soft diet..... Chew food slowly avoid things that bother him Prilosec 20 mg twice a day....... consult with  his gastroenterologist ASAP.

## 2016-02-09 ENCOUNTER — Ambulatory Visit (INDEPENDENT_AMBULATORY_CARE_PROVIDER_SITE_OTHER): Payer: Managed Care, Other (non HMO) | Admitting: Adult Health

## 2016-02-09 ENCOUNTER — Encounter: Payer: Self-pay | Admitting: Adult Health

## 2016-02-09 VITALS — BP 142/72 | Temp 97.6°F | Ht 68.25 in | Wt 207.6 lb

## 2016-02-09 DIAGNOSIS — J209 Acute bronchitis, unspecified: Secondary | ICD-10-CM | POA: Diagnosis not present

## 2016-02-09 MED ORDER — PREDNISONE 10 MG PO TABS: ORAL_TABLET | ORAL | 0 refills | Status: DC

## 2016-02-09 NOTE — Progress Notes (Signed)
Subjective:    Patient ID: Jacob Stone, male    DOB: [birth year], 66 y.o.   MRN: [id number]  HPI  66 year old male who  has a past medical history of Carotid artery calcification; Diabetes type 2, controlled ([place]); Diverticulitis; Gout; HTN (hypertension); Hyperlipidemia; Malignant neoplasm of kidney excluding renal pelvis ([place]); and Seasonal allergies. He presents to the office today for the subacute complaint of URI type symptoms. He reports that about 10 days ago his symptoms started/ He reports that his symptoms include that of semi productive cough, chest congestion in upper chest and shortness of breath with activity. He states " it feels like my typical bronchitis flare"   He denies any fevers, n/v/d    Review of Systems  Constitutional: Positive for fatigue. Negative for chills, diaphoresis and fever.  HENT: Negative.   Respiratory: Positive for cough, chest tightness, shortness of breath and wheezing.   Cardiovascular: Negative.   Neurological: Negative.   All other systems reviewed and are negative.  Past Medical History:  Diagnosis Date  . Carotid artery calcification   . Diabetes type 2, controlled ([place])   . Diverticulitis   . Gout   . HTN (hypertension)   . Hyperlipidemia   . Malignant neoplasm of kidney excluding renal pelvis ([place])   . Seasonal allergies     Social History   Social History  . Marital status: Married    Spouse name: N/A  . Number of children: N/A  . Years of education: N/A   Occupational History  . Not on file.   Social History Main Topics  . Smoking status: Never Smoker  . Smokeless tobacco: Never Used  . Alcohol use Yes  . Drug use: No  . Sexual activity: Not on file   Other Topics Concern  . Not on file   Social History Narrative  . No narrative on file    Past Surgical History:  Procedure Laterality Date  . NEPHRECTOMY     left    No family history on file.  No Known Allergies  Current Outpatient Prescriptions on  File Prior to Visit  Medication Sig Dispense Refill  . allopurinol (ZYLOPRIM) 300 MG tablet take 1 tablet by mouth once daily 100 tablet 3  . amLODipine (NORVASC) 10 MG tablet take 1 tablet by mouth once daily 90 tablet 1  . aspirin 325 MG tablet Take 325 mg by mouth daily.      . cetirizine (ZYRTEC) 10 MG tablet Take 10 mg by mouth daily.      . Docusate Calcium ([employer] STOOL SOFTENER PO) Take by mouth.      . hydrochlorothiazide (HYDRODIURIL) 12.5 MG tablet take 1 tablet by mouth once daily 90 tablet 0  . LORazepam (ATIVAN) 1 MG tablet 1/2-1 tablet at bedtime when necessary for sleep 30 tablet 3  . losartan (COZAAR) 25 MG tablet take 1 tablet by mouth once daily 90 tablet 0  . metFORMIN (GLUCOPHAGE) 500 MG tablet take 1/2 tablet by mouth every morning 100 tablet 3  . Misc Natural Products (FIBER 7 PO) Take by mouth.    Marland Kitchen omeprazole (PRILOSEC) 20 MG capsule Take 1 capsule (20 mg total) by mouth daily. 60 capsule 3  . simvastatin (ZOCOR) 20 MG tablet take 1 tablet by mouth at bedtime 90 tablet 2  . Vitamin D, Ergocalciferol, (DRISDOL) 50000 UNITS CAPS capsule Take 50,000 Units by mouth every 7 (seven) days.     No current facility-administered medications on file prior  to visit.     BP (!) 142/72   Temp 97.6 F (36.4 C) (Oral)   Ht 5' 8.25" (1.734 m)   Wt 207 lb 9.6 oz (94.2 kg)   BMI 31.33 kg/m       Objective:   Physical Exam  Constitutional: He is oriented to person, place, and time. He appears well-developed and well-nourished. No distress.  Cardiovascular: Normal rate, regular rhythm, normal heart sounds and intact distal pulses.  Exam reveals no gallop and no friction rub.   No murmur heard. Pulmonary/Chest: Effort normal. No respiratory distress. He has wheezes (trace throughout). He has no rales. He exhibits no tenderness.  Neurological: He is alert and oriented to person, place, and time.  Skin: Skin is warm and dry. No rash noted. He is not diaphoretic. No erythema. No  pallor.  Psychiatric: He has a normal mood and affect. His behavior is normal. Judgment and thought content normal.  Nursing note and vitals reviewed.     Assessment & Plan:  1. Acute bronchitis, unspecified organism - No signs of bacterial infection.  - predniSONE (DELTASONE) 10 MG tablet; 40 mg x 3 days, 20 mg x 3 days, 10 mg x 3 days  Dispense: 21 tablet; Refill: 0 - Take with plenty of water   Dorothyann Peng, NP

## 2016-03-07 ENCOUNTER — Ambulatory Visit (INDEPENDENT_AMBULATORY_CARE_PROVIDER_SITE_OTHER): Payer: Managed Care, Other (non HMO) | Admitting: Pulmonary Disease

## 2016-03-07 ENCOUNTER — Encounter: Payer: Self-pay | Admitting: Pulmonary Disease

## 2016-03-07 VITALS — BP 132/76 | HR 82 | Ht 69.0 in | Wt 201.0 lb

## 2016-03-07 DIAGNOSIS — R0683 Snoring: Secondary | ICD-10-CM

## 2016-03-07 NOTE — Progress Notes (Signed)
   Subjective:    Patient ID: Jacob Stone, male    DOB: [birth year], 66 y.o.   MRN: [id number]  HPI    Review of Systems  Constitutional: Positive for unexpected weight change. Negative for fever.  HENT: Positive for congestion. Negative for dental problem, ear pain, nosebleeds, postnasal drip, rhinorrhea, sinus pressure, sneezing, sore throat and trouble swallowing.   Eyes: Negative for redness and itching.  Respiratory: Negative for cough, chest tightness, shortness of breath and wheezing.   Cardiovascular: Negative for palpitations and leg swelling.  Gastrointestinal: Negative for nausea and vomiting.       Acid heartburn  Genitourinary: Negative for dysuria.  Musculoskeletal: Negative for joint swelling.  Skin: Negative for rash.  Neurological: Negative for headaches.  Hematological: Does not bruise/bleed easily.  Psychiatric/Behavioral: Negative for dysphoric mood. The patient is not nervous/anxious.        Objective:   Physical Exam        Assessment & Plan:

## 2016-03-07 NOTE — Patient Instructions (Signed)
Will arrange for home sleep study Will call to arrange for follow up after sleep study reviewed  

## 2016-03-07 NOTE — Progress Notes (Signed)
Past Surgical History Jacob Stone  has a past surgical history that includes Nephrectomy and Colonoscopy (08/2009).  No Known Allergies  Family History His family history includes Cancer in his father; Glaucoma in his father; Heart disease in his mother; Hypertension in his other; Stroke in his mother.  Social History Jacob Stone  reports that Jacob Stone has never smoked. Jacob Stone has never used smokeless tobacco. Jacob Stone reports that Jacob Stone does not drink alcohol or use drugs.  Review of systems  Constitutional: Positive for unexpected weight change. Negative for fever.  HENT: Positive for congestion. Negative for dental problem, ear pain, nosebleeds, postnasal drip, rhinorrhea, sinus pressure, sneezing, sore throat and trouble swallowing.   Eyes: Negative for redness and itching.  Respiratory: Negative for cough, chest tightness, shortness of breath and wheezing.   Cardiovascular: Negative for palpitations and leg swelling.  Gastrointestinal: Negative for nausea and vomiting.       Acid heartburn  Genitourinary: Negative for dysuria.  Musculoskeletal: Negative for joint swelling.  Skin: Negative for rash.  Neurological: Negative for headaches.  Hematological: Does not bruise/bleed easily.  Psychiatric/Behavioral: Negative for dysphoric mood. The patient is not nervous/anxious.     Current Outpatient Prescriptions on File Prior to Visit  Medication Sig  . allopurinol (ZYLOPRIM) 300 MG tablet take 1 tablet by mouth once daily  . amLODipine (NORVASC) 10 MG tablet take 1 tablet by mouth once daily  . aspirin 325 MG tablet Take 325 mg by mouth daily.    . cetirizine (ZYRTEC) 10 MG tablet Take 10 mg by mouth daily.    . Docusate Calcium ([employer] STOOL SOFTENER PO) Take by mouth.    . hydrochlorothiazide (HYDRODIURIL) 12.5 MG tablet take 1 tablet by mouth once daily  . LORazepam (ATIVAN) 1 MG tablet 1/2-1 tablet at bedtime when necessary for sleep  . losartan (COZAAR) 25 MG tablet take 1 tablet by mouth once daily  . metFORMIN  (GLUCOPHAGE) 500 MG tablet take 1/2 tablet by mouth every morning  . Misc Natural Products (FIBER 7 PO) Take 2 tablets by mouth daily.   Marland Kitchen omeprazole (PRILOSEC) 20 MG capsule Take 1 capsule (20 mg total) by mouth daily.  . simvastatin (ZOCOR) 20 MG tablet take 1 tablet by mouth at bedtime  . Vitamin D, Ergocalciferol, (DRISDOL) 50000 UNITS CAPS capsule Take 50,000 Units by mouth every 7 (seven) days.   No current facility-administered medications on file prior to visit.     Chief Complaint  Patient presents with  . SLEEP CONSULT    Referred by Dr Sherren Mocha. Sleep study at [facility] x 15 years ago. Epworth Score: 14    Past medical history Jacob Stone  has a past medical history of Carotid artery calcification; Diabetes type 2, controlled ([place]); Diverticulitis; Gout; HTN (hypertension); Hyperlipidemia; Malignant neoplasm of kidney excluding renal pelvis ([place]); and Seasonal allergies.  Vital signs BP 132/76 (BP Location: Left Arm, Cuff Size: Normal)   Pulse 82   Ht 5\' 9"  (1.753 m)   Wt 201 lb (91.2 kg)   SpO2 95%   BMI 29.68 kg/m   History of Present Illness Jacob Stone is a 66 y.o. male for evaluation of sleep problems.  Jacob Stone had sleep study several years ago.  Jacob Stone was told Jacob Stone had borderline sleep apnea, but didn't need therapy.  His sleep has gotten worse.  Jacob Stone has trouble staying awake unless Jacob Stone is active.  Jacob Stone will wake up with a snort.  His wife has told him Jacob Stone stops breathing while asleep.  Jacob Stone has  to sleep in recliner sometimes, and Jacob Stone can't sleep flat on his back.  Jacob Stone wakes up frequently during dreams.  Jacob Stone goes to sleep at 10 pm.  Jacob Stone falls asleep within minutes.  Jacob Stone wakes up some times to use the bathroom.  Jacob Stone gets out of bed at 530 am.  Jacob Stone feels tired in the morning.  Jacob Stone denies morning headache.  Jacob Stone does not use anything to help him stay awake.  Jacob Stone has been using low dose ativan for the past few weeks to help him sleep.  Jacob Stone denies sleep walking, sleep talking, bruxism, or nightmares.  There is no  history of restless legs.  Jacob Stone denies sleep hallucinations, sleep paralysis, or cataplexy.  The Epworth score is 14 out of 24.   Physical Exam:  General - No distress ENT - No sinus tenderness, no oral exudate, no LAN, no thyromegaly, TM clear, pupils equal/reactive, MP 3, narrow jaw, elongated uvula, enlarged tongue Cardiac - s1s2 regular, no murmur, pulses symmetric Chest - No wheeze/rales/dullness, good air entry, normal respiratory excursion Back - No focal tenderness Abd - Soft, non-tender, no organomegaly, + bowel sounds Ext - No edema Neuro - Normal strength, cranial nerves intact Skin - No rashes Psych - Normal mood, and behavior  Discussion: Jacob Stone has snoring, sleep disruption, witnessed apnea, and daytime sleepiness.  Jacob Stone has hx of HTN and DM.  I am concerned Jacob Stone could have sleep apnea.  We discussed how sleep apnea can affect various health problems, including risks for hypertension, cardiovascular disease, and diabetes.  We also discussed how sleep disruption can increase risks for accidents, such as while driving.  Weight loss as a means of improving sleep apnea was also reviewed.  Additional treatment options discussed were CPAP therapy, oral appliance, and surgical intervention.  Assessment/plan:  Snoring with concern for obstructive sleep apnea. - will arrange for home sleep study - advised Jacob Stone can continue using ativan for sleep until after his home sleep study is done, but intention would be to then wean him off sleep aide medications   Patient Instructions  Will arrange for home sleep study Will call to arrange for follow up after sleep study reviewed     Chesley Mires, M.D. Pager [phone] 03/07/2016, 3:06 PM

## 2016-03-10 ENCOUNTER — Other Ambulatory Visit: Payer: Self-pay | Admitting: Family Medicine

## 2016-04-06 DIAGNOSIS — G4733 Obstructive sleep apnea (adult) (pediatric): Secondary | ICD-10-CM

## 2016-04-07 ENCOUNTER — Other Ambulatory Visit: Payer: Self-pay | Admitting: Family Medicine

## 2016-04-07 ENCOUNTER — Telehealth: Payer: Self-pay | Admitting: Family Medicine

## 2016-04-07 NOTE — Telephone Encounter (Signed)
PT will callback to sch wellness. Pt just wanted to sch cpx

## 2016-04-07 NOTE — Telephone Encounter (Signed)
Pt due for wellness with Manuela Schwartz and cpx with Sherren Mocha if appropriate.  Help make appointments. Thanks!!

## 2016-04-07 NOTE — Telephone Encounter (Signed)
Sent in a 90 day rx.  Message sent to scheduling.  Due for yearly visits.

## 2016-04-17 DIAGNOSIS — G4733 Obstructive sleep apnea (adult) (pediatric): Secondary | ICD-10-CM

## 2016-04-18 ENCOUNTER — Telehealth: Payer: Self-pay | Admitting: Pulmonary Disease

## 2016-04-18 ENCOUNTER — Other Ambulatory Visit: Payer: Self-pay | Admitting: *Deleted

## 2016-04-18 ENCOUNTER — Encounter: Payer: Self-pay | Admitting: Pulmonary Disease

## 2016-04-18 DIAGNOSIS — R0683 Snoring: Secondary | ICD-10-CM

## 2016-04-18 DIAGNOSIS — G4733 Obstructive sleep apnea (adult) (pediatric): Secondary | ICD-10-CM

## 2016-04-18 HISTORY — DX: Obstructive sleep apnea (adult) (pediatric): G47.33

## 2016-04-18 NOTE — Telephone Encounter (Signed)
HST 04/06/16 >> AHI 72.8, SaO2 low 75%.  Will have my nurse inform pt that sleep study shows severe sleep apnea.  Options are 1) CPAP now, 2) ROV first.  If He is agreeable to CPAP, then please send order for auto CPAP range 5 to 15 cm H2O with heated humidity and mask of choice.  Have download sent 1 month after starting CPAP and set up ROV 2 months after starting CPAP.

## 2016-04-21 NOTE — Telephone Encounter (Signed)
lmomtcb x1 

## 2016-04-27 ENCOUNTER — Other Ambulatory Visit: Payer: Self-pay | Admitting: Adult Health

## 2016-04-27 ENCOUNTER — Other Ambulatory Visit: Payer: Self-pay | Admitting: Family Medicine

## 2016-04-27 NOTE — Telephone Encounter (Signed)
Patient's last CPE was 03/29/15 - is this ok?

## 2016-04-27 NOTE — Telephone Encounter (Signed)
Sent to the pharmacy by e-scribe for 90 days.  Pt has upcoming appt on 06/19/16.

## 2016-04-27 NOTE — Telephone Encounter (Signed)
Ok to refill for 90 days. Has a CPE with Dr. Sherren Mocha in may

## 2016-05-09 NOTE — Telephone Encounter (Signed)
Left message for pt to call back  °

## 2016-05-19 NOTE — Telephone Encounter (Signed)
LM x3  

## 2016-05-24 NOTE — Telephone Encounter (Deleted)
Unable to reach patient after several attempts.  Phone lines down 4/17-4/18 Mychart message sent with results attached.  Will close this encounter.

## 2016-05-29 NOTE — Telephone Encounter (Signed)
LM x 4 Letter mailed to contact our office for results. Nothing further needed.

## 2016-06-09 ENCOUNTER — Other Ambulatory Visit: Payer: Self-pay | Admitting: Family Medicine

## 2016-06-09 DIAGNOSIS — E139 Other specified diabetes mellitus without complications: Secondary | ICD-10-CM

## 2016-06-09 DIAGNOSIS — I10 Essential (primary) hypertension: Secondary | ICD-10-CM

## 2016-06-09 DIAGNOSIS — E785 Hyperlipidemia, unspecified: Secondary | ICD-10-CM

## 2016-06-12 ENCOUNTER — Other Ambulatory Visit (INDEPENDENT_AMBULATORY_CARE_PROVIDER_SITE_OTHER): Payer: Managed Care, Other (non HMO)

## 2016-06-12 DIAGNOSIS — E785 Hyperlipidemia, unspecified: Secondary | ICD-10-CM | POA: Diagnosis not present

## 2016-06-12 DIAGNOSIS — E109 Type 1 diabetes mellitus without complications: Secondary | ICD-10-CM | POA: Diagnosis not present

## 2016-06-12 DIAGNOSIS — I10 Essential (primary) hypertension: Secondary | ICD-10-CM

## 2016-06-12 DIAGNOSIS — E139 Other specified diabetes mellitus without complications: Secondary | ICD-10-CM

## 2016-06-12 LAB — MICROALBUMIN / CREATININE URINE RATIO
CREATININE, U: 127.2 mg/dL
Microalb Creat Ratio: 3.3 mg/g (ref 0.0–30.0)
Microalb, Ur: 4.2 mg/dL — ABNORMAL HIGH (ref 0.0–1.9)

## 2016-06-12 LAB — BASIC METABOLIC PANEL
BUN: 17 mg/dL (ref 6–23)
CHLORIDE: 103 meq/L (ref 96–112)
CO2: 30 mEq/L (ref 19–32)
Calcium: 9.3 mg/dL (ref 8.4–10.5)
Creatinine, Ser: 1.23 mg/dL (ref 0.40–1.50)
GFR: 62.14 mL/min (ref >60.00)
Glucose, Bld: 132 mg/dL — ABNORMAL HIGH (ref 70–99)
POTASSIUM: 4.1 meq/L (ref 3.5–5.1)
SODIUM: 140 meq/L (ref 135–145)

## 2016-06-12 LAB — HEMOGLOBIN A1C: Hgb A1c MFr Bld: 6.7 % — ABNORMAL HIGH (ref 4.6–6.5)

## 2016-06-12 LAB — LIPID PANEL
CHOL/HDL RATIO: 3
Cholesterol: 127 mg/dL (ref 0–200)
HDL: 43 mg/dL (ref >39.00)
LDL Cholesterol: 56 mg/dL (ref 0–99)
NonHDL: 84.15
TRIGLYCERIDES: 139 mg/dL (ref 0.0–149.0)
VLDL: 27.8 mg/dL (ref 0.0–40.0)

## 2016-06-12 LAB — CBC WITH DIFFERENTIAL/PLATELET
BASOS PCT: 0.8 % (ref 0.0–3.0)
Basophils Absolute: 0.1 10*3/uL (ref 0.0–0.1)
EOS PCT: 5.9 % — AB (ref 0.0–5.0)
Eosinophils Absolute: 0.4 10*3/uL (ref 0.0–0.7)
HCT: 47.7 % (ref 39.0–52.0)
Hemoglobin: 15.8 g/dL (ref 13.0–17.0)
LYMPHS ABS: 2.1 10*3/uL (ref 0.7–4.0)
Lymphocytes Relative: 29.5 % (ref 12.0–46.0)
MCHC: 33.1 g/dL (ref 30.0–36.0)
MCV: 92.8 fl (ref 78.0–100.0)
MONO ABS: 0.6 10*3/uL (ref 0.1–1.0)
Monocytes Relative: 7.7 % (ref 3.0–12.0)
NEUTROS ABS: 4 10*3/uL (ref 1.4–7.7)
NEUTROS PCT: 56.1 % (ref 43.0–77.0)
PLATELETS: 302 10*3/uL (ref 150.0–400.0)
RBC: 5.14 Mil/uL (ref 4.22–5.81)
RDW: 13.9 % (ref 11.5–15.5)
WBC: 7.2 10*3/uL (ref 4.0–10.5)

## 2016-06-12 LAB — HEPATIC FUNCTION PANEL
ALK PHOS: 60 U/L (ref 39–117)
ALT: 33 U/L (ref 0–53)
AST: 24 U/L (ref 0–37)
Albumin: 4.4 g/dL (ref 3.5–5.2)
BILIRUBIN DIRECT: 0.2 mg/dL (ref 0.0–0.3)
Total Bilirubin: 0.8 mg/dL (ref 0.2–1.2)
Total Protein: 7 g/dL (ref 6.0–8.3)

## 2016-06-12 LAB — POCT URINALYSIS DIPSTICK
Bilirubin, UA: NEGATIVE
GLUCOSE UA: NEGATIVE
Ketones, UA: NEGATIVE
LEUKOCYTES UA: NEGATIVE
NITRITE UA: NEGATIVE
Protein, UA: NEGATIVE
RBC UA: NEGATIVE
Spec Grav, UA: 1.015 (ref 1.010–1.025)
UROBILINOGEN UA: 0.2 U/dL
pH, UA: 6 (ref 5.0–8.0)

## 2016-06-12 LAB — PSA: PSA: 4.13 ng/mL — ABNORMAL HIGH (ref 0.10–4.00)

## 2016-06-12 LAB — TSH: TSH: 2 u[IU]/mL (ref 0.35–4.50)

## 2016-06-12 NOTE — Progress Notes (Unsigned)
t

## 2016-06-19 ENCOUNTER — Ambulatory Visit (INDEPENDENT_AMBULATORY_CARE_PROVIDER_SITE_OTHER): Payer: Managed Care, Other (non HMO) | Admitting: Family Medicine

## 2016-06-19 ENCOUNTER — Encounter: Payer: Self-pay | Admitting: Family Medicine

## 2016-06-19 VITALS — BP 132/82 | Temp 98.2°F | Ht 68.5 in | Wt 197.0 lb

## 2016-06-19 DIAGNOSIS — E109 Type 1 diabetes mellitus without complications: Secondary | ICD-10-CM

## 2016-06-19 DIAGNOSIS — E785 Hyperlipidemia, unspecified: Secondary | ICD-10-CM

## 2016-06-19 DIAGNOSIS — Z136 Encounter for screening for cardiovascular disorders: Secondary | ICD-10-CM | POA: Diagnosis not present

## 2016-06-19 DIAGNOSIS — E139 Other specified diabetes mellitus without complications: Secondary | ICD-10-CM

## 2016-06-19 DIAGNOSIS — I10 Essential (primary) hypertension: Secondary | ICD-10-CM | POA: Diagnosis not present

## 2016-06-19 DIAGNOSIS — R972 Elevated prostate specific antigen [PSA]: Secondary | ICD-10-CM

## 2016-06-19 MED ORDER — METFORMIN HCL 500 MG PO TABS: 250.0000 mg | ORAL_TABLET | Freq: Every morning | ORAL | 4 refills | Status: DC

## 2016-06-19 MED ORDER — LOSARTAN POTASSIUM 25 MG PO TABS: 25.0000 mg | ORAL_TABLET | Freq: Every day | ORAL | 4 refills | Status: DC

## 2016-06-19 MED ORDER — OMEPRAZOLE 20 MG PO CPDR: 20.0000 mg | DELAYED_RELEASE_CAPSULE | Freq: Every day | ORAL | 4 refills | Status: DC

## 2016-06-19 MED ORDER — AMLODIPINE BESYLATE 10 MG PO TABS: 10.0000 mg | ORAL_TABLET | Freq: Every day | ORAL | 4 refills | Status: DC

## 2016-06-19 MED ORDER — SIMVASTATIN 20 MG PO TABS: 20.0000 mg | ORAL_TABLET | Freq: Every day | ORAL | 4 refills | Status: DC

## 2016-06-19 MED ORDER — ALLOPURINOL 300 MG PO TABS: 300.0000 mg | ORAL_TABLET | Freq: Every day | ORAL | 4 refills | Status: DC

## 2016-06-19 MED ORDER — HYDROCHLOROTHIAZIDE 12.5 MG PO TABS: ORAL_TABLET | ORAL | 4 refills | Status: DC

## 2016-06-19 NOTE — Progress Notes (Signed)
Jacob Stone is a 66 year old married male nonsmoker who comes in today for evaluation of hypertension, diabetes type 2, hyperlipidemia, reflux esophagitis, and gout.  He just underwent a sleep apnea evaluation in pulmonary. Results are pending  He takes allopurinol 300 mg daily to prevent gout  He takes Norvasc 10 mg, Hydrocort thiazide 12.5, and Cozaar 25 mg daily for blood pressure control. BP 132/82  He takes bed Formento and 50 mg before breakfast for blood sugar control blood sugar week ago 132 A1c 6.7  He takes Prilosec 20 mg daily for chronic reflux esophagitis and Zocor 20 mg along with a baby aspirin because of a history of hyperlipidemia.  He gets routine eye care, dental care, colonoscopy 2000 level was normal. Subsequent to that he had partial colectomy because severe diverticulosis. He's done wellness had no complications  Vaccinations up-to-date except he is due the new shingles vaccine  Social history married lives here in [place] he works for [employer]. This is his last year of work.  He's had trouble with both knees. Jacob Stone scoped his right knee about 10 years ago. On Saturday his left knee became painful and swollen. No locking.  14 point review of systems reviewed and otherwise negative  EKG was done because history above. EKG was done and unchanged  BP 132/82 (BP Location: Right Arm)   Temp 98.2 F (36.8 C) (Oral)   Ht 5' 8.5" (1.74 m)   Wt 197 lb (89.4 kg)   BMI 29.52 kg/m  Examination of the HEENT were negative neck was supple thyroid is not enlarged no carotid bruits cardiopulmonary exam normal abdominal exam normal genitalia normal circumcised male rectum normal stool guaiac-negative prostate normal extremities normal skin normal peripheral pulses normal  #1 history of gout......... continue allopurinol  #2 history of hypertension.....Marland Kitchen continue current meds  #3 hyperlipidemia ......... continue current meds  Reflux  esophagitis ........... continue Prilosec  #5 diabetes type 2 .......... continue metformin turned 50 mg daily. Stressed diet and exercise and weight loss.  #6 elevated PSA...... normal exam.....Marland Kitchen follow-up PSA in 6 months ..........Marland Kitchen

## 2016-06-19 NOTE — Patient Instructions (Addendum)
Continue current medications  Walk 30 minutes daily  Follow-up labs for your blood sugar and your elevated PSA  in 6 months............ see me 1 week after labs for follow-up

## 2016-06-20 ENCOUNTER — Telehealth: Payer: Self-pay

## 2016-06-20 NOTE — Telephone Encounter (Signed)
Received PA request for Omeprazole 20 mg capsules. PA submitted via covermymeds & is pending. Key: Gennette Pac

## 2016-06-21 NOTE — Telephone Encounter (Signed)
PA denied, Omeprazole is excluded from coverage based on the terms of patient's benefit plan.

## 2016-06-21 NOTE — Telephone Encounter (Signed)
Left a message for a return call.

## 2016-06-23 NOTE — Telephone Encounter (Signed)
Left a message for a return call.

## 2016-06-26 NOTE — Telephone Encounter (Signed)
Spoke to the pt.  He stated it is cheaper for him to buy it OTC so he will continue to do that.

## 2016-09-08 ENCOUNTER — Other Ambulatory Visit: Payer: Self-pay | Admitting: Family Medicine

## 2016-11-15 ENCOUNTER — Ambulatory Visit (INDEPENDENT_AMBULATORY_CARE_PROVIDER_SITE_OTHER): Payer: Self-pay | Admitting: Orthopedic Surgery

## 2016-11-16 ENCOUNTER — Encounter (INDEPENDENT_AMBULATORY_CARE_PROVIDER_SITE_OTHER): Payer: Self-pay | Admitting: Orthopaedic Surgery

## 2016-11-16 ENCOUNTER — Ambulatory Visit (INDEPENDENT_AMBULATORY_CARE_PROVIDER_SITE_OTHER): Payer: Managed Care, Other (non HMO) | Admitting: Orthopaedic Surgery

## 2016-11-16 ENCOUNTER — Ambulatory Visit (INDEPENDENT_AMBULATORY_CARE_PROVIDER_SITE_OTHER): Payer: Managed Care, Other (non HMO)

## 2016-11-16 VITALS — BP 141/78 | HR 90 | Resp 12 | Ht 70.0 in | Wt 185.0 lb

## 2016-11-16 DIAGNOSIS — G8929 Other chronic pain: Secondary | ICD-10-CM

## 2016-11-16 DIAGNOSIS — M25562 Pain in left knee: Secondary | ICD-10-CM

## 2016-11-16 DIAGNOSIS — M1712 Unilateral primary osteoarthritis, left knee: Secondary | ICD-10-CM | POA: Diagnosis not present

## 2016-11-16 MED ORDER — LIDOCAINE HCL 1 % IJ SOLN
5.0000 mL | INTRAMUSCULAR | Status: AC | PRN
  Administered 2016-11-16: 5 mL

## 2016-11-16 MED ORDER — BUPIVACAINE HCL 0.5 % IJ SOLN
3.0000 mL | INTRAMUSCULAR | Status: AC | PRN
  Administered 2016-11-16: 3 mL via INTRA_ARTICULAR

## 2016-11-16 MED ORDER — METHYLPREDNISOLONE ACETATE 40 MG/ML IJ SUSP
80.0000 mg | INTRAMUSCULAR | Status: AC | PRN
Start: 2016-11-16 — End: 2016-11-16
  Administered 2016-11-16: 80 mg

## 2016-11-16 NOTE — Progress Notes (Signed)
Office Visit Note   Patient: Jacob Stone           Date of Birth: [birth year]           MRN: [id number] Visit Date: 11/16/2016              Requested by: Jacob Cookey, MD [address], [place] [address] PCP: Jacob Cookey, MD   Assessment & Plan: Visit Diagnoses:  1. Unilateral primary osteoarthritis, left knee   2. Chronic pain of left knee     Plan: Long discussion regarding x-ray findings consistent with osteoarthritis and treatment options. Has history of hypertension and cannot take NSAIDs. Will inject the knee with cortisone and monitor results.  Follow-Up Instructions: No Follow-up on file.   Orders:  Orders Placed This Encounter  Procedures  . XR KNEE 3 VIEW LEFT   No orders of the defined types were placed in this encounter.     Procedures: Large Joint Inj Date/Time: 11/16/2016 9:44 AM Performed by: Jacob Stone Authorized by: Jacob Stone   Consent Given by:  Patient Timeout: prior to procedure the correct patient, procedure, and site was verified   Indications:  Pain and joint swelling Location:  Knee Site:  L knee Prep: patient was prepped and draped in usual sterile fashion   Needle Size:  25 G Needle Length:  1.5 inches Approach:  Anteromedial Ultrasound Guidance: No   Fluoroscopic Guidance: No   Arthrogram: No   Medications:  5 mL lidocaine 1 %; 80 mg methylPREDNISolone acetate 40 MG/ML; 3 mL bupivacaine 0.5 % Aspiration Attempted: No   Patient tolerance:  Patient tolerated the procedure well with no immediate complications     Clinical Data: No additional findings.   Subjective: Chief Complaint  Patient presents with  . Left Knee - Pain    Jacob Stone is a 66 y o that presents with Left knee pain xx 6 months. Hx of R knee arthroscopy years ago and the pain is the same. Hurts when he is lying down.  Pain "comes and goes". No history of injury or trauma. Knees feels "a little tight on occasion". No numbness  or tingling. Sensation of knee giving way or locking. Does have difficulty with deep knee bends and squats with anterior knee pain.  HPI  Review of Systems  Constitutional: Negative for fatigue.  HENT: Negative for hearing loss.   Respiratory: Negative for apnea, chest tightness and shortness of breath.   Cardiovascular: Negative for chest pain, palpitations and leg swelling.  Gastrointestinal: Negative for blood in stool, constipation and diarrhea.  Genitourinary: Negative for difficulty urinating.  Musculoskeletal: Negative for arthralgias, back pain, joint swelling, myalgias, neck pain and neck stiffness.  Neurological: Negative for weakness, numbness and headaches.  Hematological: Does not bruise/bleed easily.  Psychiatric/Behavioral: Negative for sleep disturbance. The patient is not nervous/anxious.      Objective: Vital Signs: BP (!) 141/78   Pulse 90   Resp 12   Ht 5\' 10"  (1.778 m)   Wt 185 lb (83.9 kg)   BMI 26.54 kg/m   Physical Exam  Ortho Exam awake alert and oriented 3. Comfortable sitting. No left knee effusion. No lateral or medial joint pain. Some patella crepitation with pain on patellar compression. Full extension. Flexes to 120 which is a little bit less than on the right knee. No popliteal pain or mass. No calf pain. Neurovascular exam intact distally. No distal edema. Straight leg raise negative. Painless range  of motion of both hips. No shortness of breath or chest pain. Walks without a limp.  Specialty Comments:  No specialty comments available.  Imaging: Xr Knee 3 View Left  Result Date: 11/16/2016 Films of the left knee retained 3 projections standing. There is minimal decrease in the medial joint space compared to lateral. There is some irregularity along the distal femoral surface with minimal sclerosis. Also some mild degenerative changes about the patellofemoral joint. No ectopic calcification. No evidence of fracture. Findings are consistent with  mild osteoarthritis of the patellofemoral and medial compartments    PMFS History: Patient Active Problem List   Diagnosis Date Noted  . Unilateral primary osteoarthritis, left knee 11/16/2016  . OSA (obstructive sleep apnea) 04/18/2016  . Sleep dysfunction with arousal disturbance 01/17/2016  . Reflux esophagitis 01/17/2016  . Hyperlipidemia 09/14/2014  . Diabetes 1.5, managed as type 2 ([place]) 07/07/2014  . Carotid artery calcification 03/02/2011  . Malignant neoplasm of kidney excluding renal pelvis ([place]) 11/14/2007  . ALLERGIC RHINITIS 11/14/2007  . TEAR MEDIAL CARTILAGE OR MENISCUS KNEE CURRENT 10/15/2007  . DIVERTICULOSIS, COLON 04/16/2007  . Essential hypertension 04/16/2007  . DIVERTICULITIS, HX OF 04/16/2007   Past Medical History:  Diagnosis Date  . Carotid artery calcification   . Diabetes type 2, controlled ([place])   . Diverticulitis   . Gout   . HTN (hypertension)   . Hyperlipidemia   . Malignant neoplasm of kidney excluding renal pelvis ([place])   . OSA (obstructive sleep apnea) 04/18/2016  . Seasonal allergies     Family History  Problem Relation Age of Onset  . Heart disease Mother        heart failure  . Stroke Mother   . Cancer Father   . Glaucoma Father        wagoner's glaucoma  . Hypertension Other        multiple family members    Past Surgical History:  Procedure Laterality Date  . COLONOSCOPY  08/2009  . NEPHRECTOMY     left   Social History   Occupational History  . Physiological scientist    Social History Main Topics  . Smoking status: Never Smoker  . Smokeless tobacco: Never Used  . Alcohol use 0.6 oz/week    1 Cans of beer per week  . Drug use: No  . Sexual activity: Not on file

## 2016-12-08 NOTE — Addendum Note (Signed)
Addended by: Tomi Likens on: 12/08/2016 04:07 PM   Modules accepted: Orders

## 2016-12-11 ENCOUNTER — Other Ambulatory Visit (INDEPENDENT_AMBULATORY_CARE_PROVIDER_SITE_OTHER): Payer: Managed Care, Other (non HMO)

## 2016-12-11 DIAGNOSIS — E109 Type 1 diabetes mellitus without complications: Secondary | ICD-10-CM | POA: Diagnosis not present

## 2016-12-11 DIAGNOSIS — R972 Elevated prostate specific antigen [PSA]: Secondary | ICD-10-CM

## 2016-12-11 DIAGNOSIS — E139 Other specified diabetes mellitus without complications: Secondary | ICD-10-CM

## 2016-12-11 LAB — BASIC METABOLIC PANEL
BUN: 19 mg/dL (ref 6–23)
CALCIUM: 9.6 mg/dL (ref 8.4–10.5)
CHLORIDE: 103 meq/L (ref 96–112)
CO2: 30 meq/L (ref 19–32)
CREATININE: 1.29 mg/dL (ref 0.40–1.50)
GFR: 58.73 mL/min — ABNORMAL LOW (ref >60.00)
Glucose, Bld: 134 mg/dL — ABNORMAL HIGH (ref 70–99)
Potassium: 4.7 mEq/L (ref 3.5–5.1)
SODIUM: 143 meq/L (ref 135–145)

## 2016-12-11 LAB — HEMOGLOBIN A1C: HEMOGLOBIN A1C: 6.9 % — AB (ref 4.6–6.5)

## 2016-12-12 LAB — PSA, TOTAL AND FREE
PSA, % FREE: 17 % — AB (ref >25)
PSA, Free: 0.9 ng/mL
PSA, Total: 5.4 ng/mL — ABNORMAL HIGH (ref <=4.0)

## 2016-12-18 ENCOUNTER — Ambulatory Visit: Payer: Managed Care, Other (non HMO) | Admitting: Family Medicine

## 2016-12-18 ENCOUNTER — Encounter: Payer: Self-pay | Admitting: Family Medicine

## 2016-12-18 VITALS — BP 120/82 | HR 82 | Temp 97.9°F | Wt 202.0 lb

## 2016-12-18 DIAGNOSIS — E139 Other specified diabetes mellitus without complications: Secondary | ICD-10-CM

## 2016-12-18 DIAGNOSIS — E109 Type 1 diabetes mellitus without complications: Secondary | ICD-10-CM | POA: Diagnosis not present

## 2016-12-18 DIAGNOSIS — R972 Elevated prostate specific antigen [PSA]: Secondary | ICD-10-CM

## 2016-12-18 MED ORDER — METFORMIN HCL 500 MG PO TABS: ORAL_TABLET | ORAL | 4 refills | Status: DC

## 2016-12-18 NOTE — Patient Instructions (Signed)
Metformin 500 mg........... one full tablet before breakfast  Get back on your diet and exercise program  Follow-up in 6 months  Call your urologist and set up an appointment to be seen because of the elevated PSAs

## 2016-12-18 NOTE — Progress Notes (Signed)
Jacob Stone is a 66 year old married male nonsmoker who comes in today for follow-up of 2 issues  We saw last spring for his annual checkup. At that point his blood sugar was slightly elevated 132. A1c was also slightly elevated at 6.7%. He takes 250 mg of metformin daily. We discussed diet exercise and weight loss. He comes back today for follow-up having gained 5 pounds. Blood sugar 134 A1c up to 6.9. We discussed the various treatment options. He's content to retire at the end of December and is gone up restart his exercise program. In the meantime we'll increase his metformin to get his blood sugar back to normal  He's had a history of renal cell carcinoma 10 years ago. He had a nephrectomy and has done well. His PSA a year ago was 3.38. 6 months ago was 4.13. 5.4. Percent free is 17%. Will ask him to go back and see his urologist for evaluation  BP 120/82 (BP Location: Left Arm, Patient Position: Sitting, Cuff Size: Normal)   Pulse 82   Temp 97.9 F (36.6 C) (Oral)   Wt 202 lb (91.6 kg)   BMI 28.98 kg/m  Well-developed well-nourished male no acute distress vital signs stable he is afebrile  #1 diabetes type 2........... increase metformin to 500 mg daily walk 30 minutes daily follow-up in 6 months  #2 elevated PSA........... urology consult

## 2016-12-20 ENCOUNTER — Encounter: Payer: Self-pay | Admitting: Acute Care

## 2016-12-20 ENCOUNTER — Ambulatory Visit: Payer: Managed Care, Other (non HMO) | Admitting: Acute Care

## 2016-12-20 VITALS — BP 118/78 | HR 82 | Ht 69.0 in | Wt 207.0 lb

## 2016-12-20 DIAGNOSIS — G4733 Obstructive sleep apnea (adult) (pediatric): Secondary | ICD-10-CM | POA: Diagnosis not present

## 2016-12-20 NOTE — Patient Instructions (Signed)
It is nice to meet you today. We will order a CPAP machine Auto Set 5-15 cn H2O Mask of choice Please send patient for mask fitting with Lynnae Sandhoff. Please place order for all equipment Continue on CPAP at bedtime. You appear to be benefiting from the treatment Goal is to wear for at least 6 hours each night for maximal clinical benefit. Continue to work on weight loss, as the link between excess weight  and sleep apnea is well established.  Do not drive if sleepy. Remember to clean mask, tubing, filter, and reservoir once weekly with soapy water.  Follow up with 31-90 days after starting CPAP  or before as needed.  Please contact office for sooner follow up if symptoms do not improve or worsen or seek emergency care

## 2016-12-20 NOTE — Progress Notes (Signed)
History of Present Illness Jacob Stone is a 66 y.o. male never smoker with OSA , HTN,DMII, and Hyperlipidemia. He is followed  by Dr. Halford Stone.   12/20/2016 Follow Up for OSA. Pt. Presents after being seen by Dr. Halford Stone 04/2016. He had a home sleep test done 04/2016 and never followed up for results. He was called and the office left 4 messages and sent a letter, but the patient never returned our calls for follow up. He was recently seen by his PCP Dr. Sherren Stone, and he told the patient he needed to return to the pulmonary clinic for follow up. I have reviewed the results of the home sleep study done 04/2016 with the patient . He understands that he needs CPAP.He has daytime sleepiness and falls asleep during the day. He feels he gets poor quality sleep. His wife states she witnesses apnea every night. We reviewed the risks of untreated sleep apnea, and the stress it places on the heart.He denies fever, chest pain, orthopnea or hemoptysis.   Test Results: HST 04/06/16 >> AHI 72.8, SaO2 low 75%.  CBC Latest Ref Rng & Units 06/12/2016 03/19/2015 03/09/2014  WBC 4.0 - 10.5 K/uL 7.2 6.8 6.9  Hemoglobin 13.0 - 17.0 g/dL 15.8 16.3 15.8  Hematocrit 39.0 - 52.0 % 47.7 51.5 46.3  Platelets 150.0 - 400.0 K/uL 302.0 282.0 309.0    BMP Latest Ref Rng & Units 12/11/2016 06/12/2016 03/29/2015  Glucose 70 - 99 mg/dL 134(H) 132(H) 112(H)  BUN 6 - 23 mg/dL 19 17 19   Creatinine 0.40 - 1.50 mg/dL 1.29 1.23 1.24  Sodium 135 - 145 mEq/L 143 140 143  Potassium 3.5 - 5.1 mEq/L 4.7 4.1 4.5  Chloride 96 - 112 mEq/L 103 103 103  CO2 19 - 32 mEq/L 30 30 30   Calcium 8.4 - 10.5 mg/dL 9.6 9.3 10.1     Past medical hx Past Medical History:  Diagnosis Date  . Carotid artery calcification   . Diabetes type 2, controlled (Jacob Stone)   . Diverticulitis   . Gout   . HTN (hypertension)   . Hyperlipidemia   . Malignant neoplasm of kidney excluding renal pelvis ([place])   . OSA (obstructive sleep apnea) 04/18/2016  . Seasonal allergies      Social History   Tobacco Use  . Smoking status: Never Smoker  . Smokeless tobacco: Never Used  Substance Use Topics  . Alcohol use: Yes    Alcohol/week: 0.6 oz    Types: 1 Cans of beer per week  . Drug use: No    Jacob Stone reports that  has never smoked. he has never used smokeless tobacco. He reports that he drinks about 0.6 oz of alcohol per week. He reports that he does not use drugs.  Tobacco Cessation: Never Smoker   Past surgical hx, Family hx, Social hx all reviewed.  Current Outpatient Medications on File Prior to Visit  Medication Sig  . allopurinol (ZYLOPRIM) 300 MG tablet Take 1 tablet (300 mg total) by mouth daily.  Marland Kitchen amLODipine (NORVASC) 10 MG tablet Take 1 tablet (10 mg total) by mouth daily.  Marland Kitchen aspirin 325 MG tablet Take 325 mg by mouth daily.    . cetirizine (ZYRTEC) 10 MG tablet Take 10 mg by mouth daily.    . Docusate Calcium ([employer] STOOL SOFTENER PO) Take by mouth.    . hydrochlorothiazide (HYDRODIURIL) 12.5 MG tablet take 1 tablet by mouth once daily . NEEDS APPOINTMENT WITH PCP.  Marland Kitchen losartan (COZAAR) 25 MG tablet  Take 1 tablet (25 mg total) by mouth daily.  . metFORMIN (GLUCOPHAGE) 500 MG tablet 1 tablets before breakfast  . Misc Natural Products (FIBER 7 PO) Take 2 tablets by mouth daily.   . Multiple Vitamins-Minerals (DAILY MULTI 50+ PO) Take 1 tablet by mouth daily.  Marland Kitchen omeprazole (PRILOSEC) 20 MG capsule Take 1 capsule (20 mg total) by mouth daily.  . simvastatin (ZOCOR) 20 MG tablet Take 1 tablet (20 mg total) by mouth at bedtime.   No current facility-administered medications on file prior to visit.      No Known Allergies  Review Of Systems:  Constitutional:   No  weight loss, night sweats,  Fevers, chills, fatigue, or  lassitude.  HEENT:   No headaches,  Difficulty swallowing,  Tooth/dental problems, or  Sore throat,                No sneezing, itching, ear ache, nasal congestion, post nasal drip,   CV:  No chest pain,  Orthopnea, PND,  swelling in lower extremities, anasarca, dizziness, palpitations, syncope.   GI  No heartburn, indigestion, abdominal pain, nausea, vomiting, diarrhea, change in bowel habits, loss of appetite, bloody stools.   Resp: No shortness of breath with exertion or at rest.  No excess mucus, no productive cough,  No non-productive cough,  No coughing up of blood.  No change in color of mucus.  No wheezing.  No chest wall deformity  Skin: no rash or lesions.  GU: no dysuria, change in color of urine, no urgency or frequency.  No flank pain, no hematuria   MS:  No joint pain or swelling.  No decreased range of motion.  No back pain.  Psych:  No change in mood or affect. No depression or anxiety.  No memory loss.   Vital Signs BP 118/78 (BP Location: Left Arm, Cuff Size: Normal)   Pulse 82   Ht 5\' 9"  (1.753 m)   Wt 207 lb (93.9 kg)   SpO2 94%   BMI 30.57 kg/m    Physical Exam:  General- No distress,  A&Ox3, pleasant ENT: No sinus tenderness, TM clear, pale nasal mucosa, no oral exudate,no post nasal drip, no LAN Cardiac: S1, S2, regular rate and rhythm, no murmur Chest: No wheeze/ rales/ dullness; no accessory muscle use, no nasal flaring, no sternal retractions Abd.: Soft Non-tender, non-distended, obese Ext: No clubbing cyanosis, edema Neuro:  normal strength, MAE x 4, cranial nerves intact Skin: No rashes, warm and dry Psych: normal mood and behavior   Assessment/Plan  OSA (obstructive sleep apnea) OSA HST 04/06/16 >> AHI 72.8, SaO2 low 75%. Lost to follow up despite 4 messages left and letter to home Plan: We will order a CPAP machine Auto Set 5-15 cm H2O Mask of choice Please send patient for mask fitting with Lynnae Sandhoff. Please place order for all equipment Continue on CPAP at bedtime. You appear to be benefiting from the treatment Goal is to wear for at least 6 hours each night for maximal clinical benefit. Continue to work on weight loss, as the link between excess weight   and sleep apnea is well established.  Do not drive if sleepy. Remember to clean mask, tubing, filter, and reservoir once weekly with soapy water.  Follow up with 31-90 days after starting CPAP  or before as needed.  Please contact office for sooner follow up if symptoms do not improve or worsen or seek emergency care      Magdalen Spatz, NP 12/20/2016  4:53 PM

## 2016-12-20 NOTE — Assessment & Plan Note (Addendum)
OSA HST 04/06/16 >> AHI 72.8, SaO2 low 75%. Lost to follow up despite 4 messages left and letter to home Plan: We will order a CPAP machine Auto Set 5-15 cm H2O Mask of choice Please send patient for mask fitting with Lynnae Sandhoff. Please place order for all equipment Continue on CPAP at bedtime. You appear to be benefiting from the treatment Goal is to wear for at least 6 hours each night for maximal clinical benefit. Continue to work on weight loss, as the link between excess weight  and sleep apnea is well established.  Do not drive if sleepy. Remember to clean mask, tubing, filter, and reservoir once weekly with soapy water.  Follow up with 31-90 days after starting CPAP  or before as needed.  Please contact office for sooner follow up if symptoms do not improve or worsen or seek emergency care

## 2017-01-15 ENCOUNTER — Other Ambulatory Visit (HOSPITAL_BASED_OUTPATIENT_CLINIC_OR_DEPARTMENT_OTHER): Payer: Managed Care, Other (non HMO)

## 2017-01-24 ENCOUNTER — Ambulatory Visit (HOSPITAL_BASED_OUTPATIENT_CLINIC_OR_DEPARTMENT_OTHER): Payer: Managed Care, Other (non HMO) | Attending: Acute Care | Admitting: Radiology

## 2017-01-24 DIAGNOSIS — G4733 Obstructive sleep apnea (adult) (pediatric): Secondary | ICD-10-CM

## 2017-03-12 ENCOUNTER — Ambulatory Visit: Payer: Medicare Other | Admitting: Acute Care

## 2017-03-12 ENCOUNTER — Encounter: Payer: Self-pay | Admitting: Acute Care

## 2017-03-12 VITALS — BP 136/80 | HR 94 | Ht 69.0 in | Wt 198.8 lb

## 2017-03-12 DIAGNOSIS — G4733 Obstructive sleep apnea (adult) (pediatric): Secondary | ICD-10-CM

## 2017-03-12 NOTE — Progress Notes (Signed)
History of Present Illness Jacob Stone is a 67 y.o. male never smoker with OSA , HTN,DMII, and Hyperlipidemia. He is followed  by Dr. Halford Chessman.    03/12/2017 CPAP Follow up: Pt presents for follow up. He states he is doing well.He does still have some  Issues with claustrophobia, but he is dealing with it. He did have a mask fitting with Lynnae Sandhoff, and he states the nasal pillows are better. He is not using the CPAP for greater than 2.5 hours a night. He feels that right before he goes to sleep he feels like  the pressure gets too high.This is why he removes the CPAP. He knows he needs to wear the device for longer periods of time. We discussed risks of non-compliance with CPAP.He denies fever, chest pain, orthopnea or hemoptysis.  Test Results: OSA HST 04/06/16 >> AHI 72.8, SaO2 low 75%.  Down Load 02/10/2017-03/11/2017>> Auto Set 10-15 cm H2O Usage: 28/30 days>> ( 93%) > 4 hours 5 days ( 17%) Average usage 2 hours 33 minutes Median Pressure>> 5.0 cm H2O Maximum pressure 12.2 cm H2O AHI= 0.9   CBC Latest Ref Rng & Units 06/12/2016 03/19/2015 03/09/2014  WBC 4.0 - 10.5 K/uL 7.2 6.8 6.9  Hemoglobin 13.0 - 17.0 g/dL 15.8 16.3 15.8  Hematocrit 39.0 - 52.0 % 47.7 51.5 46.3  Platelets 150.0 - 400.0 K/uL 302.0 282.0 309.0    BMP Latest Ref Rng & Units 12/11/2016 06/12/2016 03/29/2015  Glucose 70 - 99 mg/dL 134(H) 132(H) 112(H)  BUN 6 - 23 mg/dL 19 17 19   Creatinine 0.40 - 1.50 mg/dL 1.29 1.23 1.24  Sodium 135 - 145 mEq/L 143 140 143  Potassium 3.5 - 5.1 mEq/L 4.7 4.1 4.5  Chloride 96 - 112 mEq/L 103 103 103  CO2 19 - 32 mEq/L 30 30 30   Calcium 8.4 - 10.5 mg/dL 9.6 9.3 10.1      Past medical hx Past Medical History:  Diagnosis Date  . Carotid artery calcification   . Diabetes type 2, controlled ([place])   . Diverticulitis   . Gout   . HTN (hypertension)   . Hyperlipidemia   . Malignant neoplasm of kidney excluding renal pelvis ([place])   . OSA (obstructive sleep apnea) 04/18/2016  . Seasonal  allergies      Social History   Tobacco Use  . Smoking status: Never Smoker  . Smokeless tobacco: Never Used  Substance Use Topics  . Alcohol use: Yes    Alcohol/week: 0.6 oz    Types: 1 Cans of beer per week  . Drug use: No    Jacob Stone reports that  has never smoked. he has never used smokeless tobacco. He reports that he drinks about 0.6 oz of alcohol per week. He reports that he does not use drugs.  Tobacco Cessation: Never smoker  Past surgical hx, Family hx, Social hx all reviewed.  Current Outpatient Medications on File Prior to Visit  Medication Sig  . allopurinol (ZYLOPRIM) 300 MG tablet Take 1 tablet (300 mg total) by mouth daily.  Marland Kitchen amLODipine (NORVASC) 10 MG tablet Take 1 tablet (10 mg total) by mouth daily.  Marland Kitchen aspirin 325 MG tablet Take 325 mg by mouth daily.    . cetirizine (ZYRTEC) 10 MG tablet Take 10 mg by mouth daily.    . Docusate Calcium ([employer]S STOOL SOFTENER PO) Take by mouth.    . hydrochlorothiazide (HYDRODIURIL) 12.5 MG tablet take 1 tablet by mouth once daily . NEEDS APPOINTMENT WITH PCP.  Marland Kitchen  losartan (COZAAR) 25 MG tablet Take 1 tablet (25 mg total) by mouth daily.  . metFORMIN (GLUCOPHAGE) 500 MG tablet 1 tablets before breakfast  . Misc Natural Products (FIBER 7 PO) Take 2 tablets by mouth daily.   . Multiple Vitamins-Minerals (DAILY MULTI 50+ PO) Take 1 tablet by mouth daily.  Marland Kitchen omeprazole (PRILOSEC) 20 MG capsule Take 1 capsule (20 mg total) by mouth daily.  . simvastatin (ZOCOR) 20 MG tablet Take 1 tablet (20 mg total) by mouth at bedtime.   No current facility-administered medications on file prior to visit.      No Known Allergies  Review Of Systems:  Constitutional:   No  weight loss, night sweats,  Fevers, chills, fatigue, or  lassitude.  HEENT:   No headaches,  Difficulty swallowing,  Tooth/dental problems, or  Sore throat,                No sneezing, itching, ear ache, nasal congestion, post nasal drip,   CV:  No chest pain,  Orthopnea,  PND, swelling in lower extremities, anasarca, dizziness, palpitations, syncope.   GI  No heartburn, indigestion, abdominal pain, nausea, vomiting, diarrhea, change in bowel habits, loss of appetite, bloody stools.   Resp: No shortness of breath with exertion or at rest.  No excess mucus, no productive cough,  No non-productive cough,  No coughing up of blood.  No change in color of mucus.  No wheezing.  No chest wall deformity  Skin: no rash or lesions.  GU: no dysuria, change in color of urine, no urgency or frequency.  No flank pain, no hematuria   MS:  No joint pain or swelling.  No decreased range of motion.  No back pain.  Psych:  No change in mood or affect. No depression or anxiety.  No memory loss.   Vital Signs BP 136/80 (BP Location: Left Arm, Cuff Size: Normal)   Pulse 94   Ht 5\' 9"  (1.753 m)   Wt 198 lb 12.8 oz (90.2 kg)   SpO2 94%   BMI 29.36 kg/m    Physical Exam:  General- No distress,  A&Ox3, pleasant ENT: No sinus tenderness, TM clear, pale nasal mucosa, no oral exudate,no post nasal drip, no LAN Cardiac: S1, S2, regular rate and rhythm, no murmur Chest: No wheeze/ rales/ dullness; no accessory muscle use, no nasal flaring, no sternal retractions Abd.: Soft Non-tender, obese Ext: No clubbing cyanosis, edema Neuro:  normal strength Skin: No rashes, warm and dry Psych: normal mood and behavior   Assessment/Plan  OSA (obstructive sleep apnea) Compliant, but is only wearing x 2 hours per night due to pressure getting too high When wearing AHI is 0.9 Plan: We will schedule you for a CPAP desensitization with [facility] ( Daytime) Please place order to decrease pressures to 5-12 cm Auto Set Please try to wear the CPAP machine at least 4 hours. Continue on CPAP at bedtime. You appear to be benefiting from the treatment Goal is to wear for at least 6 hours each night for maximal clinical benefit. Continue to work on weight loss, as the link between excess weight   and sleep apnea is well established.  Do not drive if sleepy. Remember to clean mask, tubing, filter, and reservoir once weekly with soapy water.  Follow up with Dr. Halford Chessman  In 3 months  or before as needed.  Please contact office for sooner follow up if symptoms do not improve or worsen or seek emergency care  Magdalen Spatz, NP 03/12/2017  10:56 AM

## 2017-03-12 NOTE — Patient Instructions (Signed)
It is good to see you today. We will schedule you for a CPAP desensitization with [facility] ( Daytime) Please place order to decrease pressures to 5-12 cm Auto Set Please try to wear the CPAP machine at least 4 hours. Continue on CPAP at bedtime. You appear to be benefiting from the treatment Goal is to wear for at least 6 hours each night for maximal clinical benefit. Continue to work on weight loss, as the link between excess weight  and sleep apnea is well established.  Do not drive if sleepy. Remember to clean mask, tubing, filter, and reservoir once weekly with soapy water.  Follow up with Dr. Halford Chessman  In 3 months  or before as needed.  Please contact office for sooner follow up if symptoms do not improve or worsen or seek emergency care

## 2017-03-12 NOTE — Assessment & Plan Note (Signed)
Compliant, but is only wearing x 2 hours per night due to pressure getting too high When wearing AHI is 0.9 Plan: We will schedule you for a CPAP desensitization with [facility] ( Daytime) Please place order to decrease pressures to 5-12 cm Auto Set Please try to wear the CPAP machine at least 4 hours. Continue on CPAP at bedtime. You appear to be benefiting from the treatment Goal is to wear for at least 6 hours each night for maximal clinical benefit. Continue to work on weight loss, as the link between excess weight  and sleep apnea is well established.  Do not drive if sleepy. Remember to clean mask, tubing, filter, and reservoir once weekly with soapy water.  Follow up with Dr. Halford Chessman  In 3 months  or before as needed.  Please contact office for sooner follow up if symptoms do not improve or worsen or seek emergency care

## 2017-03-22 DIAGNOSIS — C61 Malignant neoplasm of prostate: Secondary | ICD-10-CM | POA: Diagnosis not present

## 2017-03-22 DIAGNOSIS — R972 Elevated prostate specific antigen [PSA]: Secondary | ICD-10-CM | POA: Diagnosis not present

## 2017-03-26 ENCOUNTER — Ambulatory Visit (HOSPITAL_BASED_OUTPATIENT_CLINIC_OR_DEPARTMENT_OTHER): Payer: Medicare Other | Attending: Acute Care | Admitting: Radiology

## 2017-03-26 DIAGNOSIS — G4733 Obstructive sleep apnea (adult) (pediatric): Secondary | ICD-10-CM

## 2017-03-28 ENCOUNTER — Encounter: Payer: Self-pay | Admitting: Radiation Oncology

## 2017-03-28 DIAGNOSIS — C61 Malignant neoplasm of prostate: Secondary | ICD-10-CM | POA: Diagnosis not present

## 2017-03-31 DIAGNOSIS — C7490 Malignant neoplasm of unspecified part of unspecified adrenal gland: Secondary | ICD-10-CM | POA: Diagnosis not present

## 2017-03-31 DIAGNOSIS — G4733 Obstructive sleep apnea (adult) (pediatric): Secondary | ICD-10-CM | POA: Diagnosis not present

## 2017-04-03 ENCOUNTER — Other Ambulatory Visit: Payer: Self-pay | Admitting: Family Medicine

## 2017-04-13 ENCOUNTER — Encounter: Payer: Self-pay | Admitting: Radiation Oncology

## 2017-04-13 NOTE — Progress Notes (Signed)
GU Location of Tumor / Histology: prostatic adenocarcinoma  If Prostate Cancer, Gleason Score is (4 + 3) and PSA is (5.4) on 12/2016. Prostate volume: 41 cc.  Livingston Diones Spellman was referred by Dr. Stevie Kern to Dr. Karsten Ro January 2019 for further evaluation of an elevated PSA.   Biopsies of prostate (if applicable) revealed:    Past/Anticipated interventions by urology, if any: prostate biopsy, referral to Dr. Tammi Klippel for consideration of radioactive seeds  Past/Anticipated interventions by medical oncology, if any: no  Weight changes, if any: no  Bowel/Bladder complaints, if any: IPSS 3. Denies dysuria, hematuria, leakage or incontinence.   Nausea/Vomiting, if any: no  Pain issues, if any:  No   SAFETY ISSUES:  Prior radiation? no  Pacemaker/ICD? no  Possible current pregnancy? no  Is the patient on methotrexate? no  Current Complaints / other details:  67 year old male. Married. NKDA. Underwent right hydrocelectomy in 07/2007. Hx of renal cell carcinoma. He underwent a radical nephrectomy in 07/2003. Resides in [place]. Retired late 2018.

## 2017-04-16 ENCOUNTER — Ambulatory Visit
Admission: RE | Admit: 2017-04-16 | Discharge: 2017-04-16 | Disposition: A | Discharge status: Home / Self Care | Payer: Medicare Other | Source: Ambulatory Visit | Attending: Radiation Oncology | Admitting: Radiation Oncology

## 2017-04-16 ENCOUNTER — Encounter: Payer: Self-pay | Admitting: Radiation Oncology

## 2017-04-16 ENCOUNTER — Other Ambulatory Visit: Payer: Self-pay

## 2017-04-16 ENCOUNTER — Encounter: Payer: Self-pay | Admitting: Medical Oncology

## 2017-04-16 VITALS — BP 129/72 | HR 82 | Temp 98.0°F | Resp 16 | Ht 69.0 in | Wt 196.8 lb

## 2017-04-16 DIAGNOSIS — Z85528 Personal history of other malignant neoplasm of kidney: Secondary | ICD-10-CM | POA: Insufficient documentation

## 2017-04-16 DIAGNOSIS — Z7984 Long term (current) use of oral hypoglycemic drugs: Secondary | ICD-10-CM | POA: Diagnosis not present

## 2017-04-16 DIAGNOSIS — Z905 Acquired absence of kidney: Secondary | ICD-10-CM | POA: Diagnosis not present

## 2017-04-16 DIAGNOSIS — G4733 Obstructive sleep apnea (adult) (pediatric): Secondary | ICD-10-CM | POA: Diagnosis not present

## 2017-04-16 DIAGNOSIS — Z79899 Other long term (current) drug therapy: Secondary | ICD-10-CM | POA: Diagnosis not present

## 2017-04-16 DIAGNOSIS — Z7982 Long term (current) use of aspirin: Secondary | ICD-10-CM | POA: Diagnosis not present

## 2017-04-16 DIAGNOSIS — E785 Hyperlipidemia, unspecified: Secondary | ICD-10-CM | POA: Diagnosis not present

## 2017-04-16 DIAGNOSIS — M109 Gout, unspecified: Secondary | ICD-10-CM | POA: Diagnosis not present

## 2017-04-16 DIAGNOSIS — C61 Malignant neoplasm of prostate: Secondary | ICD-10-CM

## 2017-04-16 DIAGNOSIS — I10 Essential (primary) hypertension: Secondary | ICD-10-CM | POA: Insufficient documentation

## 2017-04-16 DIAGNOSIS — Z9049 Acquired absence of other specified parts of digestive tract: Secondary | ICD-10-CM | POA: Diagnosis not present

## 2017-04-16 DIAGNOSIS — E119 Type 2 diabetes mellitus without complications: Secondary | ICD-10-CM | POA: Insufficient documentation

## 2017-04-16 DIAGNOSIS — R972 Elevated prostate specific antigen [PSA]: Secondary | ICD-10-CM | POA: Diagnosis not present

## 2017-04-16 HISTORY — DX: Malignant neoplasm of prostate: C61

## 2017-04-16 NOTE — Progress Notes (Signed)
Introduced myself to Jacob Stone as the prostate nurse navigator and my role. He is not interested in surgery and here to discuss his radiation options. He was interested in brachytherapy but he has small grandchildren so this might not be the best treatment options for him. I gave him my card and asked him to call me with questions or concerns. He voiced understanding.

## 2017-04-16 NOTE — Progress Notes (Signed)
Radiation Oncology         [phone] ________________________________  Initial Outpatient Consultation  Name: Jacob Stone MRN: [id number]  Date: 04/16/2017  DOB: 1950-08-14  TK:ZSWF, Jory Ee, MD  Kathie Rhodes, MD   REFERRING PHYSICIAN: Kathie Rhodes, MD  DIAGNOSIS: 67 y.o. gentleman with Stage T1c adenocarcinoma of the prostate with Gleason Score of 4+3, and PSA of 5.4    ICD-10-CM   1. Malignant neoplasm of prostate ([place]) C61     HISTORY OF PRESENT ILLNESS: Jacob Stone is a 67 y.o. male with a diagnosis of prostate cancer. He was noted to have an elevated PSA of 5.4 in November 2018 by his primary care physician, Dr. Stevie Kern.  Accordingly, he was referred for evaluation in urology to Dr. Karsten Ro on 03/06/2017, where a digital rectal examination was performed at that time revealing no prostate nodularity.  The patient proceeded to transrectal ultrasound with 12 biopsies of the prostate on 03/22/2017.  The prostate volume measured 41 cc.  Out of 12 core biopsies, 1 was positive.  The maximum Gleason score was 4+3, and this was seen in the left apex lateral.  Biopsies of prostate revealed:    The patient reviewed the biopsy results with his urologist and he has kindly been referred today for discussion of potential radiation treatment options. Of note, he has a remote history of renal cell carcinoma s/p right nephrectomy by Dr. Karsten Ro in 2005. He has also had a colon resection in 2011 because of diverticulitis.   PREVIOUS RADIATION THERAPY: No  PAST MEDICAL HISTORY:  Past Medical History:  Diagnosis Date  . Carotid artery calcification   . Diabetes type 2, controlled ([place])   . Diverticulitis   . Gout   . HTN (hypertension)   . Hyperlipidemia   . Malignant neoplasm of kidney excluding renal pelvis ([place])   . OSA (obstructive sleep apnea) 04/18/2016  . Prostate cancer ([place])   . Seasonal allergies       PAST SURGICAL HISTORY: Past Surgical History:  Procedure  Laterality Date  . COLONOSCOPY  08/2009  . NEPHRECTOMY     right  . PROSTATE BIOPSY      FAMILY HISTORY:  Family History  Problem Relation Age of Onset  . Heart disease Mother        heart failure  . Stroke Mother   . Cancer Father        wagners  . Glaucoma Father        wagoner's glaucoma  . Hypertension Other        multiple family members    SOCIAL HISTORY:  Social History   Socioeconomic History  . Marital status: Married    Spouse name: Not on file  . Number of children: Not on file  . Years of education: Not on file  . Highest education level: Not on file  Social Needs  . Financial resource strain: Not on file  . Food insecurity - worry: Not on file  . Food insecurity - inability: Not on file  . Transportation needs - medical: Not on file  . Transportation needs - non-medical: Not on file  Occupational History  . Occupation: retired    Comment: Physiological scientist  Tobacco Use  . Smoking status: Never Smoker  . Smokeless tobacco: Never Used  Substance and Sexual Activity  . Alcohol use: Yes    Alcohol/week: 0.6 oz    Types: 1 Cans of beer per week  . Drug use: No  .  Sexual activity: Yes  Other Topics Concern  . Not on file  Social History Narrative  . Not on file    ALLERGIES: Patient has no known allergies.  MEDICATIONS:  Current Outpatient Medications  Medication Sig Dispense Refill  . allopurinol (ZYLOPRIM) 300 MG tablet Take 1 tablet (300 mg total) by mouth daily. 90 tablet 4  . amLODipine (NORVASC) 10 MG tablet Take 1 tablet (10 mg total) by mouth daily. 90 tablet 4  . aspirin 81 MG chewable tablet Chew by mouth.    . cetirizine (ZYRTEC) 10 MG tablet Take 10 mg by mouth daily.      . Docusate Calcium ([employer] STOOL SOFTENER PO) Take by mouth.      . hydrochlorothiazide (HYDRODIURIL) 12.5 MG tablet TAKE ONE TABLET BY MOUTH ONCE DAILY 90 tablet 0  . losartan (COZAAR) 25 MG tablet Take 1 tablet (25 mg total) by mouth daily. 90 tablet 4  . metFORMIN  (GLUCOPHAGE) 500 MG tablet 1 tablets before breakfast 100 tablet 4  . Misc Natural Products (FIBER 7 PO) Take 2 tablets by mouth daily.     . Multiple Vitamins-Minerals (DAILY MULTI 50+ PO) Take 1 tablet by mouth daily.    Marland Kitchen omeprazole (PRILOSEC) 20 MG capsule Take 1 capsule (20 mg total) by mouth daily. 100 capsule 4  . simvastatin (ZOCOR) 20 MG tablet Take 1 tablet (20 mg total) by mouth at bedtime. 90 tablet 4   No current facility-administered medications for this encounter.     REVIEW OF SYSTEMS:  On review of systems, the patient reports that he is doing well overall. He denies any chest pain, shortness of breath, cough, fevers, chills, night sweats, or unintended weight changes. He denies any bowel disturbances, and denies abdominal pain, nausea or vomiting. He denies any new musculoskeletal or joint aches or pains. His IPSS was 3, indicating mild urinary symptoms of frequency, intermittency, and weak stream. He denies any dysuria, hematuria, leakage or incontinence. He is able to complete sexual activity with all attempts. A complete review of systems is obtained and is otherwise negative.    PHYSICAL EXAM:  Wt Readings from Last 3 Encounters:  04/16/17 196 lb 12.8 oz (89.3 kg)  03/12/17 198 lb 12.8 oz (90.2 kg)  12/20/16 207 lb (93.9 kg)   Temp Readings from Last 3 Encounters:  04/16/17 98 F (36.7 C) (Oral)  12/18/16 97.9 F (36.6 C) (Oral)  06/19/16 98.2 F (36.8 C) (Oral)   BP Readings from Last 3 Encounters:  04/16/17 129/72  03/12/17 136/80  12/20/16 118/78   Pulse Readings from Last 3 Encounters:  04/16/17 82  03/12/17 94  12/20/16 82   Pain Assessment Pain Score: 0-No pain/10  In general this is a well appearing caucasian male in no acute distress. He is alert and oriented x4 and appropriate throughout the examination. HEENT reveals that the patient is normocephalic, atraumatic. Skin is intact without any evidence of gross lesions. Cardiovascular exam reveals a  regular rate and rhythm, no clicks rubs or murmurs are auscultated. Chest is clear to auscultation bilaterally. Lymphatic assessment is performed and does not reveal any adenopathy in the supraclavicular, axillary, or inguinal chains. Abdomen has active bowel sounds in all quadrants and is intact. The abdomen is soft, non tender, non distended. Lower extremities are negative for pretibial pitting edema, deep calf tenderness, cyanosis or clubbing.   KPS =100  100 - Normal; no complaints; no evidence of disease. 90   - Able to carry on normal  activity; minor signs or symptoms of disease. 80   - Normal activity with effort; some signs or symptoms of disease. 43   - Cares for self; unable to carry on normal activity or to do active work. 60   - Requires occasional assistance, but is able to care for most of his personal needs. 50   - Requires considerable assistance and frequent medical care. 12   - Disabled; requires special care and assistance. 30   - Severely disabled; hospital admission is indicated although death not imminent. 71   - Very sick; hospital admission necessary; active supportive treatment necessary. 10   - Moribund; fatal processes progressing rapidly. 0     - Dead  Karnofsky DA, Abelmann Ages, Craver LS and Burchenal [facility] [phone]) The use of the nitrogen mustards in the palliative treatment of carcinoma: with particular reference to bronchogenic carcinoma Cancer 1 634-56  LABORATORY DATA:  Lab Results  Component Value Date   WBC 7.2 06/12/2016   HGB 15.8 06/12/2016   HCT 47.7 06/12/2016   MCV 92.8 06/12/2016   PLT 302.0 06/12/2016   Lab Results  Component Value Date   NA 143 12/11/2016   K 4.7 12/11/2016   CL 103 12/11/2016   CO2 30 12/11/2016   Lab Results  Component Value Date   ALT 33 06/12/2016   AST 24 06/12/2016   ALKPHOS 60 06/12/2016   BILITOT 0.8 06/12/2016     RADIOGRAPHY: No results found.    IMPRESSION/PLAN: 1. 67 y.o. gentleman with Stage T1c  adenocarcinoma of the prostate with Gleason Score of 4+3, and PSA of 5.4. We discussed the patient's workup and outlined the nature of prostate cancer in this setting. The patient's T stage, Gleason's score, and PSA put him into the intermediate risk group. Accordingly, he is eligible for a variety of potential treatment options including brachytherapy or 5 1/2 weeks of external radiation. We discussed the available radiation techniques, and focused on the details and logistics and delivery. We discussed and outlined the risks, benefits, short and long-term effects associated with radiotherapy and compared and contrasted these with prostatectomy. We discussed the role of SpaceOAR in reducing the rectal toxicity associated with radiotherapy. At the end of the conversation the patient is interested in 5 1/2 weeks of external radiotherapy but would like a few days to consider his options. He will call us with his final decision.     Carola Rhine, PAC    Tyler Pita, MD  [facility] Direct Dial: [phone]  Fax: [phone] [email or web address]  Skype  [email or web address]    This document serves as a record of services personally performed by Tyler Pita, MD and Shona Simpson, PA-C. It was created on their behalf by Rae Lips, a trained medical scribe. The creation of this record is based on the scribe's personal observations and the providers' statements to them. This document has been checked and approved by the attending providers.

## 2017-04-16 NOTE — Progress Notes (Signed)
See progress note under physician encounter. 

## 2017-04-30 DIAGNOSIS — N039 Chronic nephritic syndrome with unspecified morphologic changes: Secondary | ICD-10-CM | POA: Diagnosis not present

## 2017-04-30 DIAGNOSIS — N183 Chronic kidney disease, stage 3 (moderate): Secondary | ICD-10-CM | POA: Diagnosis not present

## 2017-04-30 DIAGNOSIS — N2581 Secondary hyperparathyroidism of renal origin: Secondary | ICD-10-CM | POA: Diagnosis not present

## 2017-04-30 DIAGNOSIS — D631 Anemia in chronic kidney disease: Secondary | ICD-10-CM | POA: Diagnosis not present

## 2017-04-30 DIAGNOSIS — I129 Hypertensive chronic kidney disease with stage 1 through stage 4 chronic kidney disease, or unspecified chronic kidney disease: Secondary | ICD-10-CM | POA: Diagnosis not present

## 2017-05-04 ENCOUNTER — Telehealth: Payer: Self-pay | Admitting: *Deleted

## 2017-05-04 NOTE — Telephone Encounter (Signed)
RETURNED PATIENT'S PHONE CALL, LVM FOR A RETURN CALL 

## 2017-05-09 ENCOUNTER — Telehealth: Payer: Self-pay | Admitting: Radiation Oncology

## 2017-05-09 NOTE — Telephone Encounter (Signed)
LM to see if pt has decided on treatment yet. I encouraged him to call us back.

## 2017-05-10 ENCOUNTER — Ambulatory Visit (INDEPENDENT_AMBULATORY_CARE_PROVIDER_SITE_OTHER): Payer: Medicare Other | Admitting: Adult Health

## 2017-05-10 ENCOUNTER — Telehealth: Payer: Self-pay | Admitting: Medical Oncology

## 2017-05-10 ENCOUNTER — Encounter: Payer: Self-pay | Admitting: Adult Health

## 2017-05-10 VITALS — BP 120/80 | Temp 98.0°F | Wt 197.0 lb

## 2017-05-10 DIAGNOSIS — R059 Cough, unspecified: Secondary | ICD-10-CM

## 2017-05-10 DIAGNOSIS — R05 Cough: Secondary | ICD-10-CM | POA: Diagnosis not present

## 2017-05-10 MED ORDER — BENZONATATE 200 MG PO CAPS
200.0000 mg | ORAL_CAPSULE | Freq: Two times a day (BID) | ORAL | 0 refills | Status: DC | PRN
Start: 2017-05-10 — End: 2017-06-18

## 2017-05-10 MED ORDER — HYDROCODONE-HOMATROPINE 5-1.5 MG/5ML PO SYRP
5.0000 mL | ORAL_SOLUTION | Freq: Three times a day (TID) | ORAL | 0 refills | Status: DC | PRN
Start: 2017-05-10 — End: 2017-06-18

## 2017-05-10 NOTE — Progress Notes (Signed)
   Subjective:    Patient ID: Jacob Stone, male    DOB: 1950-08-30, 67 y.o.   MRN: [id number]  URI   This is a new problem. The current episode started in the past 7 days. There has been no fever. Associated symptoms include coughing (semi productive coug ) and ear pain. Pertinent negatives include no congestion, nausea, rhinorrhea, sinus pain, sore throat, vomiting or wheezing. Treatments tried: OTC cough suppressent  The treatment provided mild relief.       Review of Systems  Constitutional: Negative.   HENT: Positive for ear pain and postnasal drip. Negative for congestion, rhinorrhea, sinus pain and sore throat.   Respiratory: Positive for cough (semi productive coug ). Negative for chest tightness, shortness of breath and wheezing.   Gastrointestinal: Negative for nausea and vomiting.  All other systems reviewed and are negative.      Objective:   Physical Exam  Constitutional: He is oriented to person, place, and time. He appears well-developed.  HENT:  Right Ear: Hearing, tympanic membrane, external ear and ear canal normal.  Left Ear: Hearing, tympanic membrane, external ear and ear canal normal.  Nose: Nose normal. No mucosal edema or rhinorrhea. Right sinus exhibits no maxillary sinus tenderness and no frontal sinus tenderness. Left sinus exhibits no frontal sinus tenderness.  Mouth/Throat: Uvula is midline, oropharynx is clear and moist and mucous membranes are normal.  +PND   Cardiovascular: Normal rate, regular rhythm, normal heart sounds and intact distal pulses. Exam reveals no gallop and no friction rub.  No murmur heard. Pulmonary/Chest: Effort normal and breath sounds normal. No respiratory distress. He has no wheezes. He has no rales. He exhibits no tenderness.  Neurological: He is alert and oriented to person, place, and time.  Skin: Skin is warm and dry. No rash noted. No erythema. No pallor.  Psychiatric: He has a normal mood and affect. His behavior is  normal. Judgment and thought content normal.  Nursing note and vitals reviewed.     Assessment & Plan:  1. Cough - Allergic or viral  - HYDROcodone-homatropine (HYCODAN) 5-1.5 MG/5ML syrup; Take 5 mLs by mouth every 8 (eight) hours as needed for cough.  Dispense: 120 mL; Refill: 0 - benzonatate (TESSALON) 200 MG capsule; Take 1 capsule (200 mg total) by mouth 2 (two) times daily as needed for cough.  Dispense: 20 capsule; Refill: 0   Dorothyann Peng, NP

## 2017-05-10 NOTE — Telephone Encounter (Signed)
Patient called stating he has chosen brachytherapy to treat his prostate cancer. This message forwarded to [place]/Dr. Tammi Klippel.

## 2017-05-10 NOTE — Telephone Encounter (Signed)
Left message requesting a return call to discuss treatment decision. 

## 2017-05-23 ENCOUNTER — Telehealth: Payer: Self-pay | Admitting: Radiation Oncology

## 2017-05-23 NOTE — Telephone Encounter (Signed)
I spoke with the patient and he's decided that he would like to proceed with radioactive seed implant with spaceOAR. I'll make sure he's called by scheduling to coordinate this.

## 2017-05-24 ENCOUNTER — Telehealth: Payer: Self-pay | Admitting: *Deleted

## 2017-05-24 NOTE — Telephone Encounter (Signed)
CALLED PATIENT TO ASK QUESTIONS, LVM FOR A RETURN CALL 

## 2017-05-29 ENCOUNTER — Other Ambulatory Visit: Payer: Self-pay | Admitting: Urology

## 2017-05-29 ENCOUNTER — Telehealth: Payer: Self-pay | Admitting: *Deleted

## 2017-05-29 NOTE — Telephone Encounter (Signed)
CALLED PATIENT TO INFORM OF PRE-SEED APPT. FOR 06-15-17, SPOKE WITH PATIENT AND HE IS AWARE OF THIS APPT.

## 2017-06-04 ENCOUNTER — Telehealth: Payer: Self-pay | Admitting: *Deleted

## 2017-06-04 NOTE — Telephone Encounter (Signed)
Returned patient's phone call, lvm 

## 2017-06-06 ENCOUNTER — Other Ambulatory Visit: Payer: Self-pay | Admitting: Family Medicine

## 2017-06-14 ENCOUNTER — Telehealth: Payer: Self-pay | Admitting: *Deleted

## 2017-06-14 NOTE — Telephone Encounter (Signed)
CALLED PATIENT TO INFORM OF PRE-SEED APPTS. FOR 06-15-17, LVM FOR A RETURN CALL

## 2017-06-15 ENCOUNTER — Encounter (HOSPITAL_COMMUNITY)
Admission: RE | Admit: 2017-06-15 | Discharge: 2017-06-15 | Disposition: A | Discharge status: Home / Self Care | Payer: Medicare Other | Source: Ambulatory Visit | Attending: Radiation Oncology | Admitting: Radiation Oncology

## 2017-06-15 ENCOUNTER — Ambulatory Visit
Admission: RE | Admit: 2017-06-15 | Discharge: 2017-06-15 | Disposition: A | Discharge status: Home / Self Care | Payer: Medicare Other | Source: Ambulatory Visit | Attending: Radiation Oncology | Admitting: Radiation Oncology

## 2017-06-15 ENCOUNTER — Ambulatory Visit (HOSPITAL_COMMUNITY)
Admission: RE | Admit: 2017-06-15 | Discharge: 2017-06-15 | Disposition: A | Discharge status: Home / Self Care | Payer: Medicare Other | Source: Ambulatory Visit | Attending: Urology | Admitting: Urology

## 2017-06-15 DIAGNOSIS — Z510 Encounter for antineoplastic radiation therapy: Secondary | ICD-10-CM | POA: Diagnosis not present

## 2017-06-15 DIAGNOSIS — Z01818 Encounter for other preprocedural examination: Secondary | ICD-10-CM | POA: Insufficient documentation

## 2017-06-15 DIAGNOSIS — Z01810 Encounter for preprocedural cardiovascular examination: Secondary | ICD-10-CM | POA: Diagnosis not present

## 2017-06-15 DIAGNOSIS — C61 Malignant neoplasm of prostate: Secondary | ICD-10-CM

## 2017-06-15 NOTE — Progress Notes (Signed)
  Radiation Oncology         ([phone]) [phone] ________________________________  Name: Jacob Stone MRN: [id number]  Date: 06/15/2017  DOB: July 26, 1950  SIMULATION AND TREATMENT PLANNING NOTE PUBIC ARCH STUDY  NW:GNFA, Jory Ee, MD  Kathie Rhodes, MD  DIAGNOSIS: 67 y.o. gentleman with Stage T1c adenocarcinoma of the prostate with Gleason Score of 4+3, and PSA of 5.4     ICD-10-CM   1. Malignant neoplasm of prostate ([place]) C61     COMPLEX SIMULATION:  The patient presented today for evaluation for possible prostate seed implant. He was brought to the radiation planning suite and placed supine on the CT couch. A 3-dimensional image study set was obtained in upload to the planning computer. There, on each axial slice, I contoured the prostate gland. Then, using three-dimensional radiation planning tools I reconstructed the prostate in view of the structures from the transperineal needle pathway to assess for possible pubic arch interference. In doing so, I did not appreciate any pubic arch interference. Also, the patient's prostate volume was estimated based on the drawn structure. The volume was 26 cc.  Given the pubic arch appearance and prostate volume, patient remains a good candidate to proceed with prostate seed implant. Today, he freely provided informed written consent to proceed.    PLAN: The patient will undergo prostate seed implant to 145 Gy monotherapy.   ________________________________  Sheral Apley. Tammi Klippel, M.D.  This document serves as a record of services personally performed by Tyler Pita, MD. It was created on his behalf by Arlyce Harman, a trained medical scribe. The creation of this record is based on the scribe's personal observations and the provider's statements to them. This document has been checked and approved by the attending provider.

## 2017-06-18 ENCOUNTER — Encounter: Payer: Self-pay | Admitting: Family Medicine

## 2017-06-18 ENCOUNTER — Ambulatory Visit (INDEPENDENT_AMBULATORY_CARE_PROVIDER_SITE_OTHER): Payer: Medicare Other | Admitting: Family Medicine

## 2017-06-18 VITALS — BP 124/82 | HR 78 | Temp 98.0°F | Wt 195.0 lb

## 2017-06-18 DIAGNOSIS — C61 Malignant neoplasm of prostate: Secondary | ICD-10-CM

## 2017-06-18 DIAGNOSIS — Z23 Encounter for immunization: Secondary | ICD-10-CM | POA: Diagnosis not present

## 2017-06-18 DIAGNOSIS — E139 Other specified diabetes mellitus without complications: Secondary | ICD-10-CM

## 2017-06-18 DIAGNOSIS — I10 Essential (primary) hypertension: Secondary | ICD-10-CM | POA: Diagnosis not present

## 2017-06-18 LAB — HEMOGLOBIN A1C: Hgb A1c MFr Bld: 6.5 % (ref 4.6–6.5)

## 2017-06-18 NOTE — Progress Notes (Signed)
She is [name] is a 67 year old married male nonsmoker who comes in today for evaluation of multiple issues  He has a history of diabetes type 2. He's on metformin 500 mg before breakfast. His last A1c in the fall 2018 was 6.9%. Recent random blood sugar in March was 157. Weight unchanged diet unchanged exercise unchanged  We send him to urology last year because of an elevated PSA. 1 biopsy that was positive for. He is due to undergo the seed implants in June.  He also has sleep apnea. He's had his CPAP adjusted 3 recently but he can't wear it.  BP 124/82 (BP Location: Left Arm, Patient Position: Sitting, Cuff Size: Normal)   Pulse 78   Temp 98 F (36.7 C) (Oral)   Wt 195 lb (88.5 kg)   BMI 28.80 kg/m  Well-developed well-nourished male no acute distress vital signs stable he is afebrile  #1 diabetes type 2.......... check A1c  #2 hypertension ago..... Continue current therapy  #3 recent diagnosis of prostate cancer...Marland KitchenMarland KitchenMarland Kitchen seed implants June 2019.

## 2017-06-18 NOTE — Patient Instructions (Signed)
Continue current medications  Follow-up in the fall for your annual physical examination.  Walk 30 minutes daily  Carbohydrate free diet........Marland Kitchen weight loss may help the sleep apnea  Tennis ball trick  A1c today...........Marland Kitchen we will call you the report  Call your insurance company to find we get the new shingles vaccine

## 2017-06-26 ENCOUNTER — Other Ambulatory Visit: Payer: Self-pay | Admitting: Family Medicine

## 2017-07-06 ENCOUNTER — Other Ambulatory Visit: Payer: Self-pay | Admitting: Urology

## 2017-07-06 DIAGNOSIS — C61 Malignant neoplasm of prostate: Secondary | ICD-10-CM

## 2017-07-07 ENCOUNTER — Other Ambulatory Visit: Payer: Self-pay | Admitting: Family Medicine

## 2017-07-11 DIAGNOSIS — R6889 Other general symptoms and signs: Secondary | ICD-10-CM | POA: Diagnosis not present

## 2017-07-26 ENCOUNTER — Telehealth: Payer: Self-pay | Admitting: *Deleted

## 2017-07-26 NOTE — Telephone Encounter (Signed)
Called patient to inform of appt. for labs on 07-27-17 - arrival time - 10:45 am @ [facility] Admitting, lvm for a return call

## 2017-07-27 ENCOUNTER — Other Ambulatory Visit (HOSPITAL_COMMUNITY): Payer: Medicare Other

## 2017-07-27 ENCOUNTER — Encounter (HOSPITAL_COMMUNITY)
Admission: RE | Admit: 2017-07-27 | Discharge: 2017-07-27 | Disposition: A | Discharge status: Home / Self Care | Payer: Medicare Other | Source: Ambulatory Visit | Attending: Urology | Admitting: Urology

## 2017-07-27 DIAGNOSIS — C61 Malignant neoplasm of prostate: Secondary | ICD-10-CM | POA: Diagnosis present

## 2017-07-27 DIAGNOSIS — Z01812 Encounter for preprocedural laboratory examination: Secondary | ICD-10-CM | POA: Diagnosis not present

## 2017-07-27 DIAGNOSIS — I10 Essential (primary) hypertension: Secondary | ICD-10-CM | POA: Diagnosis not present

## 2017-07-27 DIAGNOSIS — Z905 Acquired absence of kidney: Secondary | ICD-10-CM | POA: Diagnosis not present

## 2017-07-27 DIAGNOSIS — Z85528 Personal history of other malignant neoplasm of kidney: Secondary | ICD-10-CM | POA: Diagnosis not present

## 2017-07-27 DIAGNOSIS — E785 Hyperlipidemia, unspecified: Secondary | ICD-10-CM | POA: Diagnosis not present

## 2017-07-27 DIAGNOSIS — E119 Type 2 diabetes mellitus without complications: Secondary | ICD-10-CM | POA: Diagnosis not present

## 2017-07-27 DIAGNOSIS — I739 Peripheral vascular disease, unspecified: Secondary | ICD-10-CM | POA: Diagnosis not present

## 2017-07-27 DIAGNOSIS — Z7984 Long term (current) use of oral hypoglycemic drugs: Secondary | ICD-10-CM | POA: Diagnosis not present

## 2017-07-27 DIAGNOSIS — Z79899 Other long term (current) drug therapy: Secondary | ICD-10-CM | POA: Diagnosis not present

## 2017-07-27 LAB — COMPREHENSIVE METABOLIC PANEL
ALT: 34 U/L (ref 17–63)
AST: 31 U/L (ref 15–41)
Albumin: 4.2 g/dL (ref 3.5–5.0)
Alkaline Phosphatase: 55 U/L (ref 38–126)
Anion gap: 5 (ref 5–15)
BUN: 15 mg/dL (ref 6–20)
CO2: 32 mmol/L (ref 22–32)
Calcium: 9.6 mg/dL (ref 8.9–10.3)
Chloride: 104 mmol/L (ref 101–111)
Creatinine, Ser: 1.18 mg/dL (ref 0.61–1.24)
GFR calc Af Amer: >60 mL/min (ref >60)
GFR calc non Af Amer: >60 mL/min (ref >60)
Glucose, Bld: 173 mg/dL — ABNORMAL HIGH (ref 65–99)
Potassium: 4.2 mmol/L (ref 3.5–5.1)
Sodium: 141 mmol/L (ref 135–145)
Total Bilirubin: 0.6 mg/dL (ref 0.3–1.2)
Total Protein: 7.3 g/dL (ref 6.5–8.1)

## 2017-07-27 LAB — CBC
HCT: 47.2 % (ref 39.0–52.0)
Hemoglobin: 16.1 g/dL (ref 13.0–17.0)
MCH: 31.6 pg (ref 26.0–34.0)
MCHC: 34.1 g/dL (ref 30.0–36.0)
MCV: 92.5 fL (ref 78.0–100.0)
Platelets: 236 10*3/uL (ref 150–400)
RBC: 5.1 MIL/uL (ref 4.22–5.81)
RDW: 13.3 % (ref 11.5–15.5)
WBC: 7.7 10*3/uL (ref 4.0–10.5)

## 2017-07-27 LAB — PROTIME-INR
INR: 1.07
Prothrombin Time: 13.8 seconds (ref 11.4–15.2)

## 2017-07-27 LAB — APTT: aPTT: 31 seconds (ref 24–36)

## 2017-07-30 ENCOUNTER — Other Ambulatory Visit: Payer: Self-pay

## 2017-07-30 ENCOUNTER — Encounter (HOSPITAL_BASED_OUTPATIENT_CLINIC_OR_DEPARTMENT_OTHER): Payer: Self-pay

## 2017-07-30 NOTE — Pre-Procedure Instructions (Signed)
CMP results 07/27/2017 faxed to Dr. Karsten Ro via epic.

## 2017-07-30 NOTE — Progress Notes (Signed)
Spoke with: "Jacob Stone" NPO:  After Midnight, no gum, candy, or mints   Arrival time: 0700 Labs: (EKG/CXR 06/15/2017, CBC CMP, PT, PTT 07/27/2017 in epic) AM medications:  Fleet enema, Allopurinol, Amlodipine, Losartan, Omeprazole Pre op orders:  Yes Ride home:  Hilda Blades (wife) [phone]

## 2017-08-02 ENCOUNTER — Telehealth: Payer: Self-pay | Admitting: *Deleted

## 2017-08-02 NOTE — H&P (Signed)
CC/HPI: Elevated PSA: He was found to have a PSA of 5.4 in 11/18. No abnormality on DRE.  TRUS/BX 03/22/17: Prostate volume - 41 cc     CC: I have been diagnosed with prostate cancer.  HPI: Jacob Stone is a 67 year-old male established patient who is here for evaluation of prostate cancer.  His prostate cancer was diagnosed 03/22/2017. His PSA at his time of diagnosis was 5.4. His cancer was T1c, 4+3=7 in 1 core.     ALLERGIES: No Allergies    MEDICATIONS: Metformin Hcl 500 mg tablet  Simvastatin 20 mg tablet  Amlodipine Besylate 5 mg tablet Oral  Aspirin 81 MG TABS Oral  Fiber TABS Oral  Hydrochlorothiazide 12.5 mg capsule Oral  Levaquin 750 mg tablet Take the morning of your prostate biopsy.  Losartan Potassium 100 mg tablet Oral  Stool Softener TABS Oral     GU PSH: Hydrocele repair - 2009 Prostate Needle Biopsy - 03/22/2017 Radical nephrectomy (open) - 2010      PSH Notes: Colon Surgery, Radical Nephrectomy, Surgery [place] Vaginalis Excision Of Hydrocele Right   NON-GU PSH: Surgical Pathology, Gross And Microscopic Examination For Prostate Needle - 03/22/2017    GU PMH: Elevated PSA, He has elected to proceed with TRUS/Bx. He will stop his aspirin in preparation. - 03/06/2017 Encounter for Prostate Cancer screening, Prostate cancer screening - 2015 BPH w/o LUTS, Benign prostatic hypertrophy without lower urinary tract symptoms - 2015 History of kidney cancer, H/O renal cell carcinoma - 2015 Hydrocele, Unspec, Hydrocele, right - 2015 History of urolithiasis, Nephrolithiasis - 2014 Personal Hx Oth Urinary System diseases, History of renal insufficiency syndrome - 2014      PMH Notes: Renal cell carcinoma: He underwent a radical nephrectomy in 6/05 for a 3.5 cm, grade 2/4 renal cell carcinoma that was pathologic stage T1a. He had mild renal insufficiency with a creatinine of 1.8 initially after surgery however that is improved over the years.   His CT scan, done on  07/03/08 revealed a soft tissue mass in the area of the right nephrectomy bed just lateral to the descending colon which measured 2 x 3 cm and is unchanged from a previous CT however there had been gradual enlargement since his CT scan done in 2006. At that time a PET scan was obtained at did reveal uptake and therefore the patient underwent CT-guided aspiration biopsy as well as core biopsy of the mass that revealed benign lymphoid tissue only.   He underwent a right hydrocelectomy in 6/09. He said in the morning he will have some slight increased sensitivity to his right testicle.     NON-GU PMH: Encounter for general adult medical examination without abnormal findings, Encounter for preventive health examination - 2015 Neoplasm of unspecified behavior of digestive system, Neoplasm of retroperitoneum - 2015 Personal history of other diseases of the circulatory system, History of hypertension - 2014 Personal history of other diseases of the digestive system, History of diverticulitis of colon - 2014, History of esophageal reflux, - 2014 Arthritis GERD Hypertension Sleep Apnea    FAMILY HISTORY: Family Health Status Number - Runs In Family Hypertension - Mother Pure Hypercholesterolemia - Mother Stroke Syndrome - Mother   SOCIAL HISTORY: Marital Status: Married Preferred Language: English; Ethnicity: Not Hispanic Or Latino; Race: White Current Smoking Status: Patient has never smoked.   Tobacco Use Assessment Completed: Used Tobacco in last 30 days? Has never drank.  Drinks 1 caffeinated drink per day.     Notes: Never A Smoker,  Drug Use, Alcohol Use, Marital History - Currently Married, Occupation:, Caffeine Use, Death In The Family Father, Tobacco Use   REVIEW OF SYSTEMS:    GU Review Male:   Patient denies frequent urination, hard to postpone urination, burning/ pain with urination, get up at night to urinate, leakage of urine, stream starts and stops, trouble starting your stream,  have to strain to urinate , erection problems, and penile pain.  Gastrointestinal (Upper):   Patient denies nausea, vomiting, and indigestion/ heartburn.  Gastrointestinal (Lower):   Patient denies diarrhea and constipation.  Constitutional:   Patient denies fever, night sweats, weight loss, and fatigue.  Skin:   Patient denies skin rash/ lesion and itching.  Eyes:   Patient denies blurred vision and double vision.  Ears/ Nose/ Throat:   Patient denies sore throat and sinus problems.  Hematologic/Lymphatic:   Patient denies swollen glands and easy bruising.  Cardiovascular:   Patient denies leg swelling and chest pains.  Respiratory:   Patient denies cough and shortness of breath.  Endocrine:   Patient denies excessive thirst.  Musculoskeletal:   Patient denies back pain and joint pain.  Neurological:   Patient denies dizziness and headaches.  Psychologic:   Patient denies depression and anxiety.   VITAL SIGNS:  Weight 195 lb / 88.45 kg  Height 69 in / 175.26 cm  BP 106/69 mmHg  Pulse 84 /min  BMI 28.8 kg/m    GU PHYSICAL EXAMINATION:    Anus and Perineum: No hemorrhoids. No anal stenosis. No rectal fissure, no anal fissure. No edema, no dimple, no perineal tenderness, no anal tenderness.  Prostate: Prostate 2 1/2+ size. Left lobe normal consistency, right lobe normal consistency. Symmetrical lobes. No prostate nodule. Left lobe no tenderness, right lobe no tenderness.   Seminal Vesicles: Nonpalpable.  Sphincter Tone: Normal sphincter. No rectal tenderness. No rectal mass.    MULTI-SYSTEM PHYSICAL EXAMINATION:    Constitutional: Well-nourished. No physical deformities. Normally developed. Good grooming.  Neck: Neck symmetrical, not swollen. Normal tracheal position.  Respiratory: No labored breathing, no use of accessory muscles.   Cardiovascular: Normal temperature, normal extremity pulses, no swelling, no varicosities.  Lymphatic: No enlargement of neck, axillae, groin.  Skin:  No paleness, no jaundice, no cyanosis. No lesion, no ulcer, no rash.  Neurologic / Psychiatric: Oriented to time, oriented to place, oriented to person. No depression, no anxiety, no agitation.  Gastrointestinal: No mass, no tenderness, no rigidity, non obese abdomen.  Eyes: Normal conjunctivae. Normal eyelids.  Ears, Nose, Mouth, and Throat: Left ear no scars, no lesions, no masses. Right ear no scars, no lesions, no masses. Nose no scars, no lesions, no masses. Normal hearing. Normal lips.  Musculoskeletal: Normal gait and station of head and neck.    PAST DATA REVIEWED:  Source Of History:  Patient   12/11/16 01/05/16 01/05/15 08/19/13 08/19/12 08/08/11 08/04/10 07/29/09  PSA  Total PSA 5.4 ng/dl 4.1 ng/dl 3.38 ng/dl 2.93  2.87  2.17  2.07  2.02   % Free PSA 17 %           PROCEDURES: None   ASSESSMENT:      ICD-10 Details  1 GU:   Prostate Cancer - C61 Left, He was most interested in proceeding with radioactive seed implant. I therefore will schedule him for an appointment with Dr. Tammi Klippel.              Notes:   I went over his pathology report with him today as well as  the significance of his Gleason score, number and location of cores positive and percent of cores positive. We then discussed his Partin table results in detail and the significance of these predictions as far as prognosis and need for further workup are concerned. I then discussed with him the various options available including active surveillance and treatment for cure such as radiation and surgery.   The patient was counseled about the natural history of prostate cancer and the standard treatment options that are available for prostate cancer. It was explained to him how his age and life expectancy, clinical stage, Gleason score, and PSA affect his prognosis, the decision to proceed with additional staging studies, as well as how that information influences recommended treatment strategies. We discussed the roles for  active surveillance, radiation therapy, surgical therapy, androgen deprivation, as well as ablative therapy options for the treatment of prostate cancer as appropriate to his individual cancer situation. We discussed the risks and benefits of these options with regard to their impact on cancer control and also in terms of potential adverse events, complications, and impact on quiality of life particularly related to urinary, bowel, and sexual function. The patient was encouraged to ask questions throughout the discussion today and all questions were answered to his stated satisfaction.       PLAN:  He has elected to proceed with radioactive seed implant.

## 2017-08-02 NOTE — Telephone Encounter (Signed)
CALLED PATIENT TO REMIND OF PROCEDURE FOR 08-03-17, SPOKE WITH PATIENT AND HE IS AWARE OF THIS PROCEDURE

## 2017-08-03 ENCOUNTER — Ambulatory Visit (HOSPITAL_BASED_OUTPATIENT_CLINIC_OR_DEPARTMENT_OTHER): Payer: Medicare Other | Admitting: Anesthesiology

## 2017-08-03 ENCOUNTER — Encounter (HOSPITAL_BASED_OUTPATIENT_CLINIC_OR_DEPARTMENT_OTHER)
Admission: RE | Disposition: A | Discharge status: Home / Self Care | Payer: Self-pay | Source: Ambulatory Visit | Attending: Urology

## 2017-08-03 ENCOUNTER — Other Ambulatory Visit: Payer: Self-pay

## 2017-08-03 ENCOUNTER — Ambulatory Visit (HOSPITAL_BASED_OUTPATIENT_CLINIC_OR_DEPARTMENT_OTHER)
Admission: RE | Admit: 2017-08-03 | Discharge: 2017-08-03 | Disposition: A | Discharge status: Home / Self Care | Payer: Medicare Other | Source: Ambulatory Visit | Attending: Urology | Admitting: Urology

## 2017-08-03 ENCOUNTER — Encounter (HOSPITAL_BASED_OUTPATIENT_CLINIC_OR_DEPARTMENT_OTHER): Payer: Self-pay | Admitting: Anesthesiology

## 2017-08-03 ENCOUNTER — Ambulatory Visit (HOSPITAL_COMMUNITY): Payer: Medicare Other

## 2017-08-03 DIAGNOSIS — I739 Peripheral vascular disease, unspecified: Secondary | ICD-10-CM | POA: Diagnosis not present

## 2017-08-03 DIAGNOSIS — I10 Essential (primary) hypertension: Secondary | ICD-10-CM | POA: Diagnosis not present

## 2017-08-03 DIAGNOSIS — Z85528 Personal history of other malignant neoplasm of kidney: Secondary | ICD-10-CM | POA: Diagnosis not present

## 2017-08-03 DIAGNOSIS — Z7984 Long term (current) use of oral hypoglycemic drugs: Secondary | ICD-10-CM | POA: Diagnosis not present

## 2017-08-03 DIAGNOSIS — E119 Type 2 diabetes mellitus without complications: Secondary | ICD-10-CM | POA: Insufficient documentation

## 2017-08-03 DIAGNOSIS — E785 Hyperlipidemia, unspecified: Secondary | ICD-10-CM | POA: Insufficient documentation

## 2017-08-03 DIAGNOSIS — Z905 Acquired absence of kidney: Secondary | ICD-10-CM | POA: Insufficient documentation

## 2017-08-03 DIAGNOSIS — Z79899 Other long term (current) drug therapy: Secondary | ICD-10-CM | POA: Diagnosis not present

## 2017-08-03 DIAGNOSIS — C61 Malignant neoplasm of prostate: Secondary | ICD-10-CM | POA: Diagnosis not present

## 2017-08-03 DIAGNOSIS — T191XXA Foreign body in bladder, initial encounter: Secondary | ICD-10-CM | POA: Diagnosis not present

## 2017-08-03 HISTORY — DX: Gastro-esophageal reflux disease without esophagitis: K21.9

## 2017-08-03 HISTORY — PX: SPACE OAR INSTILLATION: SHX6769

## 2017-08-03 HISTORY — DX: Fatty (change of) liver, not elsewhere classified: K76.0

## 2017-08-03 HISTORY — DX: Personal history of urinary calculi: Z87.442

## 2017-08-03 HISTORY — DX: Plantar fascial fibromatosis: M72.2

## 2017-08-03 HISTORY — PX: RADIOACTIVE SEED IMPLANT: SHX5150

## 2017-08-03 HISTORY — DX: Unspecified osteoarthritis, unspecified site: M19.90

## 2017-08-03 LAB — POCT I-STAT 4, (NA,K, GLUC, HGB,HCT)
Glucose, Bld: 123 mg/dL — ABNORMAL HIGH (ref 70–99)
HCT: 44 % (ref 39.0–52.0)
Hemoglobin: 15 g/dL (ref 13.0–17.0)
Potassium: 3.8 mmol/L (ref 3.5–5.1)
Sodium: 143 mmol/L (ref 135–145)

## 2017-08-03 LAB — GLUCOSE, CAPILLARY: Glucose-Capillary: 136 mg/dL — ABNORMAL HIGH (ref 70–99)

## 2017-08-03 SURGERY — INSERTION, RADIATION SOURCE, PROSTATE
Anesthesia: General | Site: Prostate

## 2017-08-03 MED ORDER — DEXAMETHASONE SODIUM PHOSPHATE 4 MG/ML IJ SOLN
INTRAMUSCULAR | Status: DC | PRN
  Administered 2017-08-03: 10 mg via INTRAVENOUS

## 2017-08-03 MED ORDER — FENTANYL CITRATE (PF) 100 MCG/2ML IJ SOLN
INTRAMUSCULAR | Status: DC | PRN
  Administered 2017-08-03: 50 ug via INTRAVENOUS
  Administered 2017-08-03 (×2): 25 ug via INTRAVENOUS

## 2017-08-03 MED ORDER — EPHEDRINE SULFATE-NACL 50-0.9 MG/10ML-% IV SOSY
PREFILLED_SYRINGE | INTRAVENOUS | Status: DC | PRN
  Administered 2017-08-03: 10 mg via INTRAVENOUS
  Administered 2017-08-03: 15 mg via INTRAVENOUS
  Administered 2017-08-03: 10 mg via INTRAVENOUS
  Administered 2017-08-03: 15 mg via INTRAVENOUS

## 2017-08-03 MED ORDER — EPHEDRINE 5 MG/ML INJ
INTRAVENOUS | Status: AC
  Filled 2017-08-03: qty 10

## 2017-08-03 MED ORDER — ONDANSETRON HCL 4 MG/2ML IJ SOLN
INTRAMUSCULAR | Status: DC | PRN
  Administered 2017-08-03: 4 mg via INTRAVENOUS

## 2017-08-03 MED ORDER — FENTANYL CITRATE (PF) 100 MCG/2ML IJ SOLN
INTRAMUSCULAR | Status: AC
  Filled 2017-08-03: qty 2

## 2017-08-03 MED ORDER — MIDAZOLAM HCL 2 MG/2ML IJ SOLN
INTRAMUSCULAR | Status: AC
  Filled 2017-08-03: qty 2

## 2017-08-03 MED ORDER — MIDAZOLAM HCL 5 MG/5ML IJ SOLN
INTRAMUSCULAR | Status: DC | PRN
  Administered 2017-08-03: 2 mg via INTRAVENOUS

## 2017-08-03 MED ORDER — PROPOFOL 10 MG/ML IV BOLUS
INTRAVENOUS | Status: AC
  Filled 2017-08-03: qty 20

## 2017-08-03 MED ORDER — PHENYLEPHRINE 40 MCG/ML (10ML) SYRINGE FOR IV PUSH (FOR BLOOD PRESSURE SUPPORT)
PREFILLED_SYRINGE | INTRAVENOUS | Status: AC
  Filled 2017-08-03: qty 10

## 2017-08-03 MED ORDER — DEXAMETHASONE SODIUM PHOSPHATE 10 MG/ML IJ SOLN
INTRAMUSCULAR | Status: AC
  Filled 2017-08-03: qty 1

## 2017-08-03 MED ORDER — PHENYLEPHRINE 40 MCG/ML (10ML) SYRINGE FOR IV PUSH (FOR BLOOD PRESSURE SUPPORT)
PREFILLED_SYRINGE | INTRAVENOUS | Status: DC | PRN
  Administered 2017-08-03 (×3): 120 ug via INTRAVENOUS
  Administered 2017-08-03: 160 ug via INTRAVENOUS

## 2017-08-03 MED ORDER — TRAMADOL HCL 50 MG PO TABS: 50.0000 mg | ORAL_TABLET | Freq: Four times a day (QID) | ORAL | 0 refills | Status: DC | PRN

## 2017-08-03 MED ORDER — IOHEXOL 300 MG/ML  SOLN
INTRAMUSCULAR | Status: DC | PRN
  Administered 2017-08-03: 7 mL

## 2017-08-03 MED ORDER — FLEET ENEMA 7-19 GM/118ML RE ENEM
1.0000 | ENEMA | Freq: Once | RECTAL | Status: DC
  Filled 2017-08-03: qty 1

## 2017-08-03 MED ORDER — LIDOCAINE 2% (20 MG/ML) 5 ML SYRINGE
INTRAMUSCULAR | Status: AC
  Filled 2017-08-03: qty 5

## 2017-08-03 MED ORDER — CIPROFLOXACIN IN D5W 400 MG/200ML IV SOLN
INTRAVENOUS | Status: AC
  Filled 2017-08-03: qty 200

## 2017-08-03 MED ORDER — PROPOFOL 10 MG/ML IV BOLUS
INTRAVENOUS | Status: DC | PRN
  Administered 2017-08-03: 20 mg via INTRAVENOUS
  Administered 2017-08-03: 180 mg via INTRAVENOUS

## 2017-08-03 MED ORDER — ONDANSETRON HCL 4 MG/2ML IJ SOLN
INTRAMUSCULAR | Status: AC
  Filled 2017-08-03: qty 2

## 2017-08-03 MED ORDER — LIDOCAINE 2% (20 MG/ML) 5 ML SYRINGE
INTRAMUSCULAR | Status: DC | PRN
  Administered 2017-08-03: 60 mg via INTRAVENOUS
  Administered 2017-08-03: 40 mg via INTRAVENOUS

## 2017-08-03 MED ORDER — CIPROFLOXACIN IN D5W 400 MG/200ML IV SOLN
400.0000 mg | INTRAVENOUS | Status: AC
  Administered 2017-08-03: 400 mg via INTRAVENOUS
  Filled 2017-08-03: qty 200

## 2017-08-03 MED ORDER — SODIUM CHLORIDE 0.9 % IJ SOLN
INTRAMUSCULAR | Status: DC | PRN
  Administered 2017-08-03: 10 mL

## 2017-08-03 MED ORDER — SODIUM CHLORIDE 0.9 % IV SOLN
INTRAVENOUS | Status: DC
  Administered 2017-08-03 (×2): via INTRAVENOUS
  Filled 2017-08-03: qty 1000

## 2017-08-03 MED ORDER — SODIUM CHLORIDE 0.9 % IR SOLN
Status: DC | PRN
  Administered 2017-08-03: 1000 mL via INTRAVESICAL

## 2017-08-03 SURGICAL SUPPLY — 27 items
BAG URINE DRAINAGE (UROLOGICAL SUPPLIES) ×2 IMPLANT
BLADE CLIPPER SURG (BLADE) ×2 IMPLANT
CATH FOLEY 2WAY SLVR  5CC 16FR (CATHETERS) ×1
CATH FOLEY 2WAY SLVR 5CC 16FR (CATHETERS) ×1 IMPLANT
CATH ROBINSON RED A/P 20FR (CATHETERS) ×2 IMPLANT
CLOTH BEACON ORANGE TIMEOUT ST (SAFETY) ×2 IMPLANT
CONT SPECI 4OZ STER CLIK (MISCELLANEOUS) ×4 IMPLANT
COVER BACK TABLE 60X90IN (DRAPES) ×2 IMPLANT
COVER MAYO STAND STRL (DRAPES) ×2 IMPLANT
DRSG TEGADERM 4X4.75 (GAUZE/BANDAGES/DRESSINGS) ×3 IMPLANT
DRSG TEGADERM 8X12 (GAUZE/BANDAGES/DRESSINGS) ×4 IMPLANT
GAUZE SPONGE 4X4 12PLY STRL LF (GAUZE/BANDAGES/DRESSINGS) ×1 IMPLANT
GLOVE BIO SURGEON STRL SZ8 (GLOVE) ×2 IMPLANT
GLOVE ECLIPSE 8.0 STRL XLNG CF (GLOVE) ×6 IMPLANT
GOWN STRL REUS W/TWL XL LVL3 (GOWN DISPOSABLE) ×2 IMPLANT
I-SEED AgX100 Iodine-125 Radiunuclide Brachytherap ×90 IMPLANT
IMPL SPACEOAR SYSTEM 10ML (MISCELLANEOUS) ×1 IMPLANT
IMPLANT SPACEOAR SYSTEM 10ML (MISCELLANEOUS) ×2
IV NS 1000ML (IV SOLUTION) ×2
IV NS 1000ML BAXH (IV SOLUTION) ×1 IMPLANT
KIT TURNOVER CYSTO (KITS) ×2 IMPLANT
MARKER SKIN DUAL TIP RULER LAB (MISCELLANEOUS) ×2 IMPLANT
PACK CYSTO (CUSTOM PROCEDURE TRAY) ×2 IMPLANT
SURGILUBE 2OZ TUBE FLIPTOP (MISCELLANEOUS) ×2 IMPLANT
TOWEL OR 17X24 6PK STRL BLUE (TOWEL DISPOSABLE) ×3 IMPLANT
UNDERPAD 30X30 (UNDERPADS AND DIAPERS) ×4 IMPLANT
WATER STERILE IRR 500ML POUR (IV SOLUTION) ×2 IMPLANT

## 2017-08-03 NOTE — Anesthesia Postprocedure Evaluation (Signed)
Anesthesia Post Note  Patient: Jacob Stone.  Procedure(s) Performed: RADIOACTIVE SEED IMPLANT/BRACHYTHERAPY IMPLANT (N/A Prostate) SPACE OAR INSTILLATION (N/A Prostate)     Patient location during evaluation: PACU Anesthesia Type: General Level of consciousness: awake and alert Pain management: pain level controlled Vital Signs Assessment: post-procedure vital signs reviewed and stable Respiratory status: spontaneous breathing, nonlabored ventilation and respiratory function stable Cardiovascular status: blood pressure returned to baseline and stable Postop Assessment: no apparent nausea or vomiting Anesthetic complications: no    Last Vitals:  Vitals:   08/03/17 1145 08/03/17 1200  BP: 119/75 117/73  Pulse: 84 85  Resp: 18 20  Temp:    SpO2: 94% 96%    Last Pain:  Vitals:   08/03/17 1200  TempSrc:   PainSc: 0-No pain                 Lynda Rainwater

## 2017-08-03 NOTE — Transfer of Care (Signed)
  Last Vitals:  Vitals Value Taken Time  BP    Temp    Pulse 85 08/03/2017 10:51 AM  Resp 12 08/03/2017 10:51 AM  SpO2 97 % 08/03/2017 10:51 AM  Vitals shown include unvalidated device data.  Last Pain:  Vitals:   08/03/17 0808  TempSrc:   PainSc: 0-No pain      Patients Stated Pain Goal: 5 (08/03/17 8657)  Immediate Anesthesia Transfer of Care Note  Patient: Jacob Stone.  Procedure(s) Performed: Procedure(s) (LRB): RADIOACTIVE SEED IMPLANT/BRACHYTHERAPY IMPLANT (N/A) SPACE OAR INSTILLATION (N/A)  Patient Location: PACU  Anesthesia Type: General  Level of Consciousness: awake, alert  and oriented  Airway & Oxygen Therapy: Patient Spontanous Breathing and Patient connected to nasal cannula oxygen  Post-op Assessment: Report given to PACU RN and Post -op Vital signs reviewed and stable  Post vital signs: Reviewed and stable  Complications: No apparent anesthesia complications

## 2017-08-03 NOTE — Anesthesia Procedure Notes (Signed)
Procedure Name: LMA Insertion Date/Time: 08/03/2017 9:19 AM Performed by: Lynda Rainwater, MD Pre-anesthesia Checklist: Patient identified, Emergency Drugs available, Suction available and Patient being monitored Patient Re-evaluated:Patient Re-evaluated prior to induction Oxygen Delivery Method: Circle system utilized Preoxygenation: Pre-oxygenation with 100% oxygen Induction Type: IV induction Ventilation: Mask ventilation without difficulty LMA: LMA inserted LMA Size: 4.0 Number of attempts: 1 Airway Equipment and Method: Bite block Placement Confirmation: positive ETCO2 Tube secured with: Tape Dental Injury: Teeth and Oropharynx as per pre-operative assessment

## 2017-08-03 NOTE — Anesthesia Preprocedure Evaluation (Signed)
Anesthesia Evaluation  Patient identified by MRN, date of birth, ID band Patient awake    Reviewed: Allergy & Precautions, NPO status , Patient's Chart, lab work & pertinent test results  Airway Mallampati: III  TM Distance: >3 FB Neck ROM: Full    Dental no notable dental hx. (+) Teeth Intact   Pulmonary sleep apnea and Continuous Positive Airway Pressure Ventilation ,    Pulmonary exam normal breath sounds clear to auscultation       Cardiovascular hypertension, Pt. on medications + Peripheral Vascular Disease  Normal cardiovascular exam Rhythm:Regular Rate:Normal     Neuro/Psych negative neurological ROS  negative psych ROS   GI/Hepatic GERD  Medicated and Controlled,  Endo/Other  diabetes, Well Controlled, Type 2, Oral Hypoglycemic AgentsHyperlipidemia  Renal/GU Renal diseaseHx/o renal Ca S/P nephrectomy   Prostate Ca    Musculoskeletal  (+) Arthritis , Osteoarthritis,    Abdominal   Peds  Hematology negative hematology ROS (+)   Anesthesia Other Findings   Reproductive/Obstetrics                             Anesthesia Physical Anesthesia Plan  ASA: II  Anesthesia Plan: General   Post-op Pain Management:    Induction: Intravenous  PONV Risk Score and Plan: 4 or greater and Ondansetron, Dexamethasone, Treatment may vary due to age or medical condition and Midazolam  Airway Management Planned:   Additional Equipment:   Intra-op Plan:   Post-operative Plan: Extubation in OR  Informed Consent: I have reviewed the patients History and Physical, chart, labs and discussed the procedure including the risks, benefits and alternatives for the proposed anesthesia with the patient or authorized representative who has indicated his/her understanding and acceptance.   Dental advisory given  Plan Discussed with: CRNA and Surgeon  Anesthesia Plan Comments:         Anesthesia  Quick Evaluation

## 2017-08-03 NOTE — Discharge Instructions (Signed)
Brachytherapy for Prostate Cancer, Care After Refer to this sheet in the next few weeks. These instructions provide you with information on caring for yourself after your procedure. Your health care provider may also give you more specific instructions. Your treatment has been planned according to current medical practices, but problems sometimes occur. Call your health care provider if you have any problems or questions after your procedure. What can I expect after the procedure? The area behind the scrotum will probably be tender and bruised. For a short period of time you may have:  Difficulty passing urine. You may need a catheter for a few days to a month.  Blood in the urine or semen.  A feeling of constipation because of prostate swelling.  Frequent feeling of an urgent need to urinate.  For a long period of time you may have:  Inflammation of the rectum. This happens in about 2% of people who have the procedure.  Erection problems. These vary with age and occur in about 15-40% of men.  Difficulty urinating. This is caused by scarring in the urethra.  Diarrhea.  Follow these instructions at home:  Take medicines only as directed by your health care provider.  You will probably have a catheter in your bladder for several days. You will have blood in the urine bag and should drink a lot of fluids to keep it a light red color.  Keep all follow-up visits as directed by your health care provider. If you have a catheter, it will be removed during one of these visits.  Try not to sit directly on the area behind the scrotum. A soft cushion can decrease the discomfort. Ice packs may also be helpful for the discomfort. Do not put ice directly on the skin.  Shower and wash the area behind the scrotum gently. Do not sit in a tub.  If you have had the brachytherapy that uses the seeds, limit your close contact with children and pregnant women for 2 months because of the radiation still  in the prostate. After that period of time, the levels drop off quickly. Get help right away if:  You have a fever.  You have chills.  You have shortness of breath.  You have chest pain.  You have thick blood, like tomato juice, in the urine bag.  Your catheter is blocked so urine cannot get into the bag. Your bladder area or lower abdomen may be swollen.  There is excessive bleeding from your rectum. It is normal to have a little blood mixed with your stool.  There is severe discomfort in the treated area that does not go away with pain medicine.  You have abdominal discomfort.  You have severe nausea or vomiting.  You develop any new or unusual symptoms. This information is not intended to replace advice given to you by your health care provider. Make sure you discuss any questions you have with your health care provider. Document Released: 02/25/2010 Document Revised: 07/07/2015 Document Reviewed: 07/16/2012 Elsevier Interactive Patient Education  2017 [place]  Antibiotics You may be given a prescription for an antibiotic to take when you arrive home. If so, be sure to take every tablet in the bottle, even if you are feeling better before the prescription is finished. If you begin itching, notice a rash or start to swell on your trunk, arms, legs and/or throat, immediately stop taking the antibiotic and call your Urologist. Diet Resume your  usual diet when you return home. To keep your bowels moving easily and softly, drink prune, apple and cranberry juice at room temperature. You may also take a stool softener, such as Colace, which is available without prescription at local pharmacies. Daily activities ? No driving or heavy lifting for at least two days after the implant. ? No bike riding, horseback riding or riding lawn mowers for the first month after the implant. ? Any strenuous physical activity should be  approved by your doctor before you resume it. Sexual relations You may resume sexual relations two weeks after the procedure. A condom should be used for the first two weeks. Your semen may be dark brown or black; this is normal and is related bleeding that may have occurred during the implant. Postoperative swelling Expect swelling and bruising of the scrotum and perineum (the area between the scrotum and anus). Both the swelling and the bruising should resolve in l or 2 weeks. Ice packs and over- the-counter medications such as Tylenol, Advil or Aleve may lessen your discomfort. Postoperative urination Most men experience burning on urination and/or urinary frequency. If this becomes bothersome, contact your Urologist.  Medication can be prescribed to relieve these problems.  It is normal to have some blood in your urine for a few days after the implant. Special instructions related to the seeds It is unlikely that you will pass an Iodine-125 seed in your urine. The seeds are silver in color and are about as large as a grain of rice. If you pass a seed, do not handle it with your fingers. Use a spoon to place it in an envelope or jar in place this in base occluded area such as the garage or basement for return to the radiation clinic at your convenience.  Contact your doctor for ? Temperature greater than 101 F ? Increasing pain ? Inability to urinate Follow-up  You should have follow up with your urologist and radiation oncologist about 3 weeks after the procedure. General information regarding Iodine seeds ? Iodine-125 is a low energy radioactive material. It is not deeply penetrating and loses energy at short distances. Your prostate will absorb the radiation. Objects that are touched or used by the patient do not become radioactive. ? Body wastes (urine and stool) or body fluids (saliva, tears, semen or blood) are not radioactive. ? The Nuclear Regulatory Commission [facility]) has determined that  no radiation precautions are needed for patients undergoing Iodine-125 seed implantation. The [facility] states that such patients do not present a risk to the people around them, including young children and pregnant women. However, in keeping with the general principle that radiation exposure should be kept as low reasonably possible, we suggest the following: ? Children and pets should not sit on the patient's lap for the first two (2) weeks after the implant. ? Pregnant (or possibly pregnant) women should avoid prolonged, close contact with the patient for the first two (2) weeks after the implant. ? A distance of three (3) feet is acceptable. ? At a distance of three (3) feet, there is no limit to the length of time anyone can be with the patient.  Post Anesthesia Home Care Instructions  Activity: Get plenty of rest for the remainder of the day. A responsible individual must stay with you for 24 hours following the procedure.  For the next 24 hours, DO NOT: -Drive a car -Paediatric nurse -Drink alcoholic beverages -Take any medication unless instructed by your physician -Make any legal  decisions or sign important papers.  Meals: Start with liquid foods such as gelatin or soup. Progress to regular foods as tolerated. Avoid greasy, spicy, heavy foods. If nausea and/or vomiting occur, drink only clear liquids until the nausea and/or vomiting subsides. Call your physician if vomiting continues.  Special Instructions/Symptoms: Your throat may feel dry or sore from the anesthesia or the breathing tube placed in your throat during surgery. If this causes discomfort, gargle with warm salt water. The discomfort should disappear within 24 hours.  If you had a scopolamine patch placed behind your ear for the management of post- operative nausea and/or vomiting:  1. The medication in the patch is effective for 72 hours, after which it should be removed.  Wrap patch in a tissue and discard in the trash.  Wash hands thoroughly with soap and water. 2. You may remove the patch earlier than 72 hours if you experience unpleasant side effects which may include dry mouth, dizziness or visual disturbances. 3. Avoid touching the patch. Wash your hands with soap and water after contact with the patch.   ?

## 2017-08-03 NOTE — Op Note (Signed)
PATIENT:  Jacob Stone.  PRE-OPERATIVE DIAGNOSIS:  Adenocarcinoma of the prostate  POST-OPERATIVE DIAGNOSIS:  Same  PROCEDURE:  1. I-125 radioactive seed implantation 2. Cystoscopy  3. Placement of SpaceOAR 4.  Removal of foreign body from bladder  SURGEON:  Surgeon(s): Jacob Stone  Radiation oncologist: Dr. Tyler Stone  ANESTHESIA:  General  EBL:  Minimal  DRAINS: None  INDICATION: Jacob Stone. is a 67 year old male with intermediate risk, low volume adenocarcinoma the prostate who has elected to proceed with radioactive seed implantation.  Description of procedure: After informed consent the patient was brought to the major OR, placed on the table and administered general anesthesia. He was then moved to the modified lithotomy position with his perineum perpendicular to the floor. His perineum and genitalia were then sterilely prepped. An official timeout was then performed. A 16 French Foley catheter was then placed in the bladder and filled with dilute contrast, a rectal tube was placed in the rectum and the transrectal ultrasound probe was placed in the rectum and affixed to the stand. He was then sterilely draped.  Real time ultrasonography was used along with the seed planning software [employer] Prostate vs. 4.2.2.4. This was used to develop the seed plan including the number of needles as well as number of seeds required for complete and adequate coverage.  The needles were then preloaded with seeds and spacers according to the previously developed plan.  Real-time ultrasonography was then used along with the previously developed plan to implant a total of 92 seeds using 26 needles. This proceeded without difficulty or complication.   I then proceeded with placement of SpaceOAR by introducing a needle with the bevel angled inferiorly approximately 2 cm superior to the anus. This was angled downward and under direct ultrasound was placed within the space between  the prostatic capsule and rectum. This was confirmed with a small amount of sterile saline injected and this was performed under direct ultrasound. I then attached the SpaceOAR to the needle and injected this in the space between the prostate and rectum with good placement noted.  A Foley catheter was then removed as well as the transrectal ultrasound probe and rectal probe. Flexible cystoscopy was then performed using the 17 French flexible scope which revealed a normal urethra throughout its length down to the sphincter which appeared intact. The prostatic urethra revealed bilobar hypertrophy but no evidence of obstruction, seeds, spacers or lesions. The bladder was then entered and fully and systematically inspected. The ureteral orifices were noted to be of normal configuration and position. The mucosa revealed no evidence of tumors. There were also no stones identified within the bladder. I noted a seed visible underneath the mucosa of the prostate just proximal to the veru.  I therefore used alligator forceps which were passed through the flexible cystoscope and this seed was grasped and removed along with the attached strand and a second seed.  No this reduced bleeding occurred.  The total number of seeds present to 90.  The cystoscope was then removed and the patient was awakened and taken to recovery room in stable and satisfactory condition. He tolerated procedure well and there were no intraoperative complications.

## 2017-08-05 NOTE — Progress Notes (Signed)
  Radiation Oncology         [phone] ________________________________  Name: Jacob Stone. MRN: [id number]  Date: 08/05/2017  DOB: 11/22/1950       Prostate Seed Implant  UM:PNTI, Jory Ee, MD  No ref. provider found  DIAGNOSIS: 67 y.o. gentleman with Stage T1c adenocarcinoma of the prostate with Gleason Score of 4+3, and PSA of 5.4    ICD-10-CM   1. Malignant neoplasm of prostate [facility]) [place] Discharge patient    PROCEDURE: Insertion of radioactive I-125 seeds into the prostate gland.  RADIATION DOSE: 145 Gy, definitive therapy.  TECHNIQUE: Jacob Stone. was brought to the operating room with the urologist. He was placed in the dorsolithotomy position. He was catheterized and a rectal tube was inserted. The perineum was shaved, prepped and draped. The ultrasound probe was then introduced into the rectum to see the prostate gland.  TREATMENT DEVICE: A needle grid was attached to the ultrasound probe stand and anchor needles were placed.  3D PLANNING: The prostate was imaged in 3D using a sagittal sweep of the prostate probe. These images were transferred to the planning computer. There, the prostate, urethra and rectum were defined on each axial reconstructed image. Then, the software created an optimized 3D plan and a few seed positions were adjusted. The quality of the plan was reviewed using [facility] information for the target and the following two organs at risk:  Urethra and Rectum.  Then the accepted plan was printed and handed off to the radiation therapist.  Under my supervision, the custom loading of the seeds and spacers was carried out and loaded into sealed vicryl sleeves.  These pre-loaded needles were then placed into the needle holder.Marland Kitchen  PROSTATE VOLUME STUDY:  Using transrectal ultrasound the volume of the prostate was verified to be 43.9 cc.  SPECIAL TREATMENT PROCEDURE/SUPERVISION AND HANDLING: The pre-loaded needles were then delivered under sagittal guidance. A  total of 26 needles were used to deposit 92 seeds in the prostate gland. The individual seed activity was 0.322 mCi.  SpaceOAR:  Yes  COMPLEX SIMULATION: At the end of the procedure, an anterior radiograph of the pelvis was obtained to document seed positioning and count. Cystoscopy was performed to check the urethra and bladder.  MICRODOSIMETRY: At the end of the procedure, the patient was emitting 0.168 mR/hr at 1 meter. Accordingly, he was considered safe for hospital discharge.  PLAN: The patient will return to the radiation oncology clinic for post implant CT dosimetry in three weeks.   ________________________________  Sheral Apley Tammi Klippel, M.D.

## 2017-08-06 ENCOUNTER — Encounter (HOSPITAL_BASED_OUTPATIENT_CLINIC_OR_DEPARTMENT_OTHER): Payer: Self-pay | Admitting: Urology

## 2017-08-06 DIAGNOSIS — R300 Dysuria: Secondary | ICD-10-CM | POA: Diagnosis not present

## 2017-08-16 ENCOUNTER — Ambulatory Visit (INDEPENDENT_AMBULATORY_CARE_PROVIDER_SITE_OTHER): Payer: Medicare Other | Admitting: Adult Health

## 2017-08-16 ENCOUNTER — Encounter: Payer: Self-pay | Admitting: Adult Health

## 2017-08-16 VITALS — BP 120/70 | Temp 98.0°F | Wt 194.0 lb

## 2017-08-16 DIAGNOSIS — R202 Paresthesia of skin: Secondary | ICD-10-CM

## 2017-08-16 DIAGNOSIS — R200 Anesthesia of skin: Secondary | ICD-10-CM

## 2017-08-16 LAB — CBC WITH DIFFERENTIAL/PLATELET
BASOS ABS: 0 10*3/uL (ref 0.0–0.1)
Basophils Relative: 0.6 % (ref 0.0–3.0)
Eosinophils Absolute: 0.4 10*3/uL (ref 0.0–0.7)
Eosinophils Relative: 4.6 % (ref 0.0–5.0)
HCT: 45.8 % (ref 39.0–52.0)
Hemoglobin: 15.4 g/dL (ref 13.0–17.0)
LYMPHS ABS: 1.8 10*3/uL (ref 0.7–4.0)
Lymphocytes Relative: 20.4 % (ref 12.0–46.0)
MCHC: 33.7 g/dL (ref 30.0–36.0)
MCV: 93.2 fl (ref 78.0–100.0)
MONO ABS: 0.6 10*3/uL (ref 0.1–1.0)
Monocytes Relative: 6.7 % (ref 3.0–12.0)
NEUTROS PCT: 67.7 % (ref 43.0–77.0)
Neutro Abs: 5.8 10*3/uL (ref 1.4–7.7)
PLATELETS: 297 10*3/uL (ref 150.0–400.0)
RBC: 4.92 Mil/uL (ref 4.22–5.81)
RDW: 13.8 % (ref 11.5–15.5)
WBC: 8.6 10*3/uL (ref 4.0–10.5)

## 2017-08-16 LAB — BASIC METABOLIC PANEL
BUN: 16 mg/dL (ref 6–23)
CALCIUM: 9.7 mg/dL (ref 8.4–10.5)
CHLORIDE: 102 meq/L (ref 96–112)
CO2: 29 mEq/L (ref 19–32)
CREATININE: 1.33 mg/dL (ref 0.40–1.50)
GFR: 56.58 mL/min — ABNORMAL LOW (ref >60.00)
Glucose, Bld: 149 mg/dL — ABNORMAL HIGH (ref 70–99)
Potassium: 4.5 mEq/L (ref 3.5–5.1)
Sodium: 141 mEq/L (ref 135–145)

## 2017-08-16 LAB — TSH: TSH: 1.9 u[IU]/mL (ref 0.35–4.50)

## 2017-08-16 NOTE — Progress Notes (Signed)
Subjective:    Patient ID: Jacob Sermons., male    DOB: [birth year], 67 y.o.   MRN: [id number]  HPI 68 year old male who  has a past medical history of Carotid artery calcification (03/2011), Diabetes type 2, controlled ([place]), Diverticulitis, Fatty liver, GERD (gastroesophageal reflux disease), Gout, History of kidney stones, HTN (hypertension), Hyperlipidemia, Malignant neoplasm of kidney excluding renal pelvis ([place]), OA (osteoarthritis), OSA (obstructive sleep apnea) (04/18/2016), Plantar fasciitis, Prostate cancer ([place]), and Seasonal allergies.  He presents to the office today for the complaint of intermittent epsidoes of " feeling cold and episodes of tingling" in his upper extremities and across this chest. Reports that his symptoms started about 10 days ago, two weeks ago he had seed implants for prostate cancer.  He reports following up with urology about this issue earlier in the week at which time they did a UA when it returned negative he was advised to follow-up with primary care is a did not believe it was something due to seed implants.  Reports that he feels more cold and tingling sensation when he is in the air conditioning. A few minutes after going outside his symptoms resolve completely  Denies any changes in grip strength.   Denies any new medications or supplements.    Review of Systems See HPI   Past Medical History:  Diagnosis Date  . Carotid artery calcification 03/2011   MILD HARD PLAQUE LEFT BULB, 0-39% BIL. ICA STENOSIS, PATENT VERTEBRAL ARTERIES WITH ANTEGRADE FLOW  . Diabetes type 2, controlled ([place])   . Diverticulitis   . Fatty liver   . GERD (gastroesophageal reflux disease)   . Gout   . History of kidney stones    Left kidney  . HTN (hypertension)   . Hyperlipidemia   . Malignant neoplasm of kidney excluding renal pelvis ([facility])    Right Kidney  . OA (osteoarthritis)    Knee  . OSA (obstructive sleep apnea) 04/18/2016   SEVERE; UNABLE TO WEAR CPAP  MORE THAN 2-3 HOURS PER NIGHT  . Plantar fasciitis   . Prostate cancer ([place])   . Seasonal allergies     Social History   Socioeconomic History  . Marital status: Married    Spouse name: Not on file  . Number of children: Not on file  . Years of education: Not on file  . Highest education level: Not on file  Occupational History  . Occupation: retired    Comment: Physiological scientist  Social Needs  . Financial resource strain: Not on file  . Food insecurity:    Worry: Not on file    Inability: Not on file  . Transportation needs:    Medical: Not on file    Non-medical: Not on file  Tobacco Use  . Smoking status: Never Smoker  . Smokeless tobacco: Never Used  Substance and Sexual Activity  . Alcohol use: Yes    Alcohol/week: 0.6 oz    Types: 1 Cans of beer per week  . Drug use: No  . Sexual activity: Yes  Lifestyle  . Physical activity:    Days per week: Not on file    Minutes per session: Not on file  . Stress: Not on file  Relationships  . Social connections:    Talks on phone: Not on file    Gets together: Not on file    Attends religious service: Not on file    Active member of club or organization: Not on file  Attends meetings of clubs or organizations: Not on file    Relationship status: Not on file  . Intimate partner violence:    Fear of current or ex partner: Not on file    Emotionally abused: Not on file    Physically abused: Not on file    Forced sexual activity: Not on file  Other Topics Concern  . Not on file  Social History Narrative  . Not on file    Past Surgical History:  Procedure Laterality Date  . COLON SURGERY  2011   Partial colon removed due to diverticulitis  . COLONOSCOPY  08/2009  . NEPHRECTOMY Right   . PROSTATE BIOPSY    . RADIOACTIVE SEED IMPLANT N/A 08/03/2017   Procedure: RADIOACTIVE SEED IMPLANT/BRACHYTHERAPY IMPLANT;  Surgeon: Kathie Rhodes, MD;  Location: [facility];  Service: Urology;  Laterality: N/A;    . SPACE OAR INSTILLATION N/A 08/03/2017   Procedure: SPACE OAR INSTILLATION;  Surgeon: Kathie Rhodes, MD;  Location: [facility];  Service: Urology;  Laterality: N/A;    Family History  Problem Relation Age of Onset  . Heart disease Mother        heart failure  . Stroke Mother   . Cancer Father        wagners  . Glaucoma Father        wagoner's glaucoma  . Hypertension Other        multiple family members    Allergies  Allergen Reactions  . Oxycodone Other (See Comments)    Patient stated he has nightmares with oxycodone    Current Outpatient Medications on File Prior to Visit  Medication Sig Dispense Refill  . allopurinol (ZYLOPRIM) 300 MG tablet TAKE 1 TABLET BY MOUTH ONCE DAILY 90 tablet 3  . amLODipine (NORVASC) 10 MG tablet TAKE 1 TABLET BY MOUTH ONCE DAILY 90 tablet 0  . aspirin 81 MG chewable tablet Chew by mouth.    . cetirizine (ZYRTEC) 10 MG tablet Take 10 mg by mouth daily.      . Docusate Calcium ([employer] STOOL SOFTENER PO) Take by mouth daily.     . hydrochlorothiazide (HYDRODIURIL) 12.5 MG tablet TAKE ONE TABLET BY MOUTH ONCE DAILY 90 tablet 3  . losartan (COZAAR) 25 MG tablet TAKE 1 TABLET BY MOUTH ONCE DAILY 90 tablet 3  . metFORMIN (GLUCOPHAGE) 500 MG tablet 1 tablets before breakfast 100 tablet 4  . Misc Natural Products (FIBER 7 PO) Take 2 tablets by mouth daily.     . Multiple Vitamins-Minerals (DAILY MULTI 50+ PO) Take 1 tablet by mouth daily.    Marland Kitchen omeprazole (PRILOSEC) 20 MG capsule Take 1 capsule (20 mg total) by mouth daily. 100 capsule 4  . simvastatin (ZOCOR) 20 MG tablet TAKE 1 TABLET BY MOUTH EVERY NIGHT AT BEDTIME 90 tablet 3   No current facility-administered medications on file prior to visit.     BP 120/70   Temp 98 F (36.7 C) (Oral)   Wt 194 lb (88 kg)   BMI 28.65 kg/m       Objective:   Physical Exam  Constitutional: He is oriented to person, place, and time. He appears well-developed and well-nourished. No distress.   Cardiovascular: Normal rate, regular rhythm, normal heart sounds and intact distal pulses. Exam reveals no gallop and no friction rub.  No murmur heard. Pulmonary/Chest: Effort normal and breath sounds normal. No stridor. No respiratory distress. He has no wheezes. He has no rales. He exhibits no tenderness.  Musculoskeletal: Normal range of motion. He exhibits no edema, tenderness or deformity.  Neurological: He is alert and oriented to person, place, and time. He has normal strength and normal reflexes. He displays no tremor. No sensory deficit. He exhibits normal muscle tone.  Reflex Scores:      Tricep reflexes are 2+ on the right side and 2+ on the left side.      Bicep reflexes are 2+ on the right side and 2+ on the left side.      Brachioradialis reflexes are 2+ on the right side and 2+ on the left side. Skin: Skin is warm and dry. Capillary refill takes less than 2 seconds. He is not diaphoretic.  Psychiatric: He has a normal mood and affect. His behavior is normal. Judgment and thought content normal.  Nursing note and vitals reviewed.     Assessment & Plan:  1. Numbness and tingling -Unknown cause of his symptoms.  Does not necessarily sound like neuropathy. I am unsure if this is related to radiation, but doubtful due to localized area of radioactive seeds. Will check basic labs and watchful waiting for now. Consider trial of Neurontin?  - CBC with Differential/Platelet - Basic Metabolic Panel - TSH  Dorothyann Peng, NP

## 2017-08-22 ENCOUNTER — Telehealth: Payer: Self-pay | Admitting: Adult Health

## 2017-08-22 NOTE — Telephone Encounter (Signed)
Left VM to call back relating to numbness in upper extremities

## 2017-08-23 ENCOUNTER — Telehealth: Payer: Self-pay | Admitting: *Deleted

## 2017-08-23 NOTE — Progress Notes (Signed)
Radiation Oncology         [phone]) [phone] ________________________________  Name: Jacob Stone. MRN: [id number]  Date: 08/24/2017  DOB: [birth year]  Post-Seed Follow-Up Visit Note  CC: Dorena Cookey, MD  Kathie Rhodes, MD  Diagnosis:   67 y.o. gentleman with Stage T1c adenocarcinoma of the prostate with Gleason Score of 4+3, and PSA of 5.4    ICD-10-CM   1. Malignant neoplasm of prostate ([place]) C61     Narrative:  The patient returns today for routine follow-up.  He is complaining of increased urinary frequency, urgency, mild dysuria and urinary hesitation symptoms. He filled out a questionnaire regarding urinary function today providing and overall IPSS score of 16 characterizing his symptoms as moderate.  His pre-implant score was 3. In general, he feels he empties his bladder well on voiding but notes a weaker stream first thing in the mornings despite voiding a fair volume. The force of stream and dysuria seem to improve throughout the day.  He does continue with frequent, loose stools but denies abdominal pain or bloating. Nocturia is only once per night.  He had a follow-up visit with Dr. Karsten Ro  this week and confirmed that he is emptying his bladder well on voiding.  He is not currently taking any medications for his prostate or voiding symptoms.  Overall, he is pleased with his progress to date.  ALLERGIES:  is allergic to oxycodone.  Meds: Current Outpatient Medications  Medication Sig Dispense Refill  . allopurinol (ZYLOPRIM) 300 MG tablet TAKE 1 TABLET BY MOUTH ONCE DAILY 90 tablet 3  . amLODipine (NORVASC) 10 MG tablet TAKE 1 TABLET BY MOUTH ONCE DAILY 90 tablet 0  . aspirin 81 MG chewable tablet Chew by mouth.    . cetirizine (ZYRTEC) 10 MG tablet Take 10 mg by mouth daily.      . Docusate Calcium ([employer] STOOL SOFTENER PO) Take by mouth daily.     . hydrochlorothiazide (HYDRODIURIL) 12.5 MG tablet TAKE ONE TABLET BY MOUTH ONCE DAILY 90 tablet 3  . losartan (COZAAR) 25 MG  tablet TAKE 1 TABLET BY MOUTH ONCE DAILY 90 tablet 3  . metFORMIN (GLUCOPHAGE) 500 MG tablet 1 tablets before breakfast 100 tablet 4  . Misc Natural Products (FIBER 7 PO) Take 2 tablets by mouth daily.     . Multiple Vitamins-Minerals (DAILY MULTI 50+ PO) Take 1 tablet by mouth daily.    Marland Kitchen omeprazole (PRILOSEC) 20 MG capsule Take 1 capsule (20 mg total) by mouth daily. 100 capsule 4  . simvastatin (ZOCOR) 20 MG tablet TAKE 1 TABLET BY MOUTH EVERY NIGHT AT BEDTIME 90 tablet 3   No current facility-administered medications for this encounter.     Physical Findings: In general this is a well appearing Caucasian male in no acute distress.  He's alert and oriented x4 and appropriate throughout the examination. Cardiopulmonary assessment is negative for acute distress and he exhibits normal effort.   Lab Findings: Lab Results  Component Value Date   WBC 8.6 08/16/2017   HGB 15.4 08/16/2017   HCT 45.8 08/16/2017   MCV 93.2 08/16/2017   PLT 297.0 08/16/2017    Radiographic Findings:  Patient underwent CT imaging in our clinic for post implant dosimetry. The CT was reviewed by Dr. Tammi Klippel and appears to demonstrate an adequate distribution of radioactive seeds throughout the prostate gland. There are no seeds in or near the rectum.  He is scheduled for an MRI of the prostate today at noon and  these images will be fused with the CT images for further evaluation.  We suspect the final radiation plan and dosimetry will show appropriate coverage of the prostate gland.   Impression/Plan: The patient is recovering from the effects of radiation. His urinary symptoms should gradually improve over the next 4-6 months. We talked about this today. He is encouraged by his improvement already and is otherwise pleased with his outcome. We also talked about long-term follow-up for prostate cancer following seed implant. He understands that ongoing PSA determinations and digital rectal exams will help perform  surveillance to rule out disease recurrence. He has a follow up appointment scheduled with [facility] in 11/2017. He understands what to expect with his PSA measures. Patient was also educated today about some of the long-term effects from radiation including a small risk for rectal bleeding and possibly erectile dysfunction. We talked about some of the general management approaches to these potential complications. However, I did encourage the patient to contact our office or return at any point if he has questions or concerns related to his previous radiation and prostate cancer.    Nicholos Johns, PA-C    Tyler Pita, MD  [facility] Direct Dial: [phone]  Fax: [phone] [email or web address]  Skype  [email or web address]  This document serves as a record of services personally performed by Tyler Pita, MD and Nicholos Johns, PA-C. It was created on their behalf by Vanessa Ralphs, a trained medical scribe. The creation of this record is based on the scribe's personal observations and the provider's statements to them. This document has been checked and approved by the attending provider.

## 2017-08-23 NOTE — Progress Notes (Signed)
  Radiation Oncology         [phone]) [phone] ________________________________  Name: Jacob Stone. MRN: [id number]  Date: 08/24/2017  DOB: 04-27-1950  COMPLEX SIMULATION NOTE  NARRATIVE:  The patient was brought to the [facility] today following prostate seed implantation approximately one month ago.  Identity was confirmed.  All relevant records and images related to the planned course of therapy were reviewed.  Then, the patient was set-up supine.  CT images were obtained.  The CT images were loaded into the planning software.  Then the prostate and rectum were contoured.  Treatment planning then occurred.  The implanted iodine 125 seeds were identified by the physics staff for projection of radiation distribution  I have requested : 3D Simulation  I have requested a DVH of the following structures: Prostate and rectum.    ________________________________  Sheral Apley Tammi Klippel, M.D.  This document serves as a record of services personally performed by Tyler Pita, MD. It was created on his behalf by Vanessa Ralphs, a trained medical scribe. The creation of this record is based on the scribe's personal observations and the provider's statements to them. This document has been checked and approved by the attending provider.

## 2017-08-23 NOTE — Telephone Encounter (Signed)
CALLED PATIENT TO REMIND OF POST SEED APPTS. FOR 08-24-17, LVM FOR A RETURN CALL

## 2017-08-24 ENCOUNTER — Ambulatory Visit
Admission: RE | Admit: 2017-08-24 | Discharge: 2017-08-24 | Disposition: A | Discharge status: Home / Self Care | Payer: Medicare Other | Source: Ambulatory Visit | Attending: Radiation Oncology | Admitting: Radiation Oncology

## 2017-08-24 ENCOUNTER — Ambulatory Visit (HOSPITAL_COMMUNITY)
Admission: RE | Admit: 2017-08-24 | Discharge: 2017-08-24 | Disposition: A | Discharge status: Home / Self Care | Payer: Medicare Other | Source: Ambulatory Visit | Attending: Urology | Admitting: Urology

## 2017-08-24 ENCOUNTER — Encounter: Payer: Self-pay | Admitting: Medical Oncology

## 2017-08-24 ENCOUNTER — Encounter: Payer: Self-pay | Admitting: Radiation Oncology

## 2017-08-24 ENCOUNTER — Other Ambulatory Visit: Payer: Self-pay

## 2017-08-24 VITALS — BP 118/81 | HR 73 | Temp 98.0°F | Resp 18 | Ht 69.0 in | Wt 192.0 lb

## 2017-08-24 VITALS — BP 118/81 | HR 73 | Temp 98.0°F | Resp 18 | Ht 69.0 in | Wt 192.8 lb

## 2017-08-24 DIAGNOSIS — Z885 Allergy status to narcotic agent status: Secondary | ICD-10-CM | POA: Diagnosis not present

## 2017-08-24 DIAGNOSIS — Y842 Radiological procedure and radiotherapy as the cause of abnormal reaction of the patient, or of later complication, without mention of misadventure at the time of the procedure: Secondary | ICD-10-CM | POA: Insufficient documentation

## 2017-08-24 DIAGNOSIS — R3915 Urgency of urination: Secondary | ICD-10-CM | POA: Insufficient documentation

## 2017-08-24 DIAGNOSIS — R300 Dysuria: Secondary | ICD-10-CM | POA: Diagnosis not present

## 2017-08-24 DIAGNOSIS — C61 Malignant neoplasm of prostate: Secondary | ICD-10-CM | POA: Insufficient documentation

## 2017-08-24 DIAGNOSIS — Z7984 Long term (current) use of oral hypoglycemic drugs: Secondary | ICD-10-CM | POA: Diagnosis not present

## 2017-08-24 DIAGNOSIS — Z7982 Long term (current) use of aspirin: Secondary | ICD-10-CM | POA: Insufficient documentation

## 2017-08-24 DIAGNOSIS — R3911 Hesitancy of micturition: Secondary | ICD-10-CM | POA: Diagnosis not present

## 2017-08-24 DIAGNOSIS — Z79899 Other long term (current) drug therapy: Secondary | ICD-10-CM | POA: Insufficient documentation

## 2017-08-24 NOTE — Progress Notes (Signed)
Received patient in the clinic for post seed follow up. Patient is three weeks s/p seed implant. Pre seed IPSS 3. Post seed IPSS 16. Patient seen by Dr. Karsten Ro 1 week s/p implant and urinalysis was negative. Patient scheduled to follow up with Dr. Karsten Ro in three months. Reports dysuria developed this week. Reports hematuria resolved 1.5 weeks after implant. Denies urinary leakage or incontinence. Saw PCP about tingling cold skin that develop after implant. MRI scheduled for today at noon.   BP 118/81   Pulse 73   Temp 98 F (36.7 C) (Oral)   Resp 18   Ht 5\' 9"  (1.753 m)   Wt 192 lb (87.1 kg)   SpO2 98%   BMI 28.35 kg/m  Wt Readings from Last 3 Encounters:  08/24/17 192 lb (87.1 kg)  08/24/17 192 lb 12.8 oz (87.5 kg)  08/16/17 194 lb (88 kg)

## 2017-09-06 ENCOUNTER — Other Ambulatory Visit: Payer: Self-pay | Admitting: Family Medicine

## 2017-09-27 ENCOUNTER — Ambulatory Visit
Admission: RE | Admit: 2017-09-27 | Discharge: 2017-09-27 | Disposition: A | Discharge status: Home / Self Care | Payer: Medicare Other | Source: Ambulatory Visit | Attending: Radiation Oncology | Admitting: Radiation Oncology

## 2017-09-27 ENCOUNTER — Encounter: Payer: Self-pay | Admitting: Radiation Oncology

## 2017-09-27 DIAGNOSIS — C61 Malignant neoplasm of prostate: Secondary | ICD-10-CM | POA: Insufficient documentation

## 2017-09-27 NOTE — Progress Notes (Signed)
  Radiation Oncology         [phone]) [phone] ________________________________  Name: Jacob Stone. MRN: [id number]  Date: 09/27/2017  DOB: 1950/04/02  3D Planning Note   Prostate Brachytherapy Post-Implant Dosimetry  Diagnosis: 67 y.o. gentleman with Stage T1c adenocarcinoma of the prostate with Gleason Score of 4+3, and PSA of 5.4   Narrative: On a previous date, Jacob Stone. returned following prostate seed implantation for post implant planning. He underwent CT scan complex simulation to delineate the three-dimensional structures of the pelvis and demonstrate the radiation distribution.  Since that time, the seed localization, and complex isodose planning with dose volume histograms have now been completed.  Results:   Prostate Coverage - The dose of radiation delivered to the 90% or more of the prostate gland (D90) was 107.08% of the prescription dose. This exceeds our goal of greater than 90%. Rectal Sparing - The volume of rectal tissue receiving the prescription dose or higher was 0.0 cc. This falls under our thresholds tolerance of 1.0 cc.  Impression: The prostate seed implant appears to show adequate target coverage and appropriate rectal sparing.  Plan:  The patient will continue to follow with urology for ongoing PSA determinations. I would anticipate a high likelihood for local tumor control with minimal risk for rectal morbidity.  ________________________________  Sheral Apley Tammi Klippel, M.D.

## 2017-10-16 ENCOUNTER — Ambulatory Visit (INDEPENDENT_AMBULATORY_CARE_PROVIDER_SITE_OTHER): Payer: Medicare Other

## 2017-10-16 ENCOUNTER — Telehealth: Payer: Self-pay | Admitting: Radiation Oncology

## 2017-10-16 ENCOUNTER — Encounter (INDEPENDENT_AMBULATORY_CARE_PROVIDER_SITE_OTHER): Payer: Self-pay | Admitting: Orthopaedic Surgery

## 2017-10-16 ENCOUNTER — Ambulatory Visit (INDEPENDENT_AMBULATORY_CARE_PROVIDER_SITE_OTHER): Payer: Medicare Other | Admitting: Orthopaedic Surgery

## 2017-10-16 VITALS — BP 109/79 | HR 76 | Ht 69.0 in | Wt 185.0 lb

## 2017-10-16 DIAGNOSIS — M7062 Trochanteric bursitis, left hip: Secondary | ICD-10-CM

## 2017-10-16 DIAGNOSIS — M25552 Pain in left hip: Secondary | ICD-10-CM | POA: Diagnosis not present

## 2017-10-16 MED ORDER — LIDOCAINE HCL 1 % IJ SOLN
2.0000 mL | INTRAMUSCULAR | Status: AC | PRN
  Administered 2017-10-16: 2 mL

## 2017-10-16 MED ORDER — BUPIVACAINE HCL 0.5 % IJ SOLN
2.0000 mL | INTRAMUSCULAR | Status: AC | PRN
  Administered 2017-10-16: 2 mL via INTRA_ARTICULAR

## 2017-10-16 MED ORDER — METHYLPREDNISOLONE ACETATE 40 MG/ML IJ SUSP
40.0000 mg | INTRAMUSCULAR | Status: AC | PRN
  Administered 2017-10-16: 40 mg via INTRA_ARTICULAR

## 2017-10-16 NOTE — Progress Notes (Signed)
Office Visit Note   Patient: Jacob Stone.           Date of Birth: 1950-04-14           MRN: [id number] Visit Date: 10/16/2017              Requested by: Dorena Cookey, MD [place], [place] [id number] PCP: Dorena Cookey, MD   Assessment & Plan: Visit Diagnoses:  1. Pain in left hip   2. Trochanteric bursitis, left hip     Plan:  #1: Corticosteroid injection to the left trochanteric bursa with good results #2: Told him that if he had any problems with terms of elevated glucose is to call his doctor immediately or to the emergency room #3: If this is beneficial no further treatment if not we can plan on doing an MRI scan.  Follow-Up Instructions: Return if symptoms worsen or fail to improve.   Face-to-face time spent with patient was greater than 30 minutes.  Greater than 50% of the time was spent in counseling and coordination of care as well discussion with oncology, urology, and radiology..  Orders:  Orders Placed This Encounter  Procedures  . XR HIP UNILAT W OR W/O PELVIS 2-3 VIEWS LEFT   No orders of the defined types were placed in this encounter.     Procedures: Large Joint Inj: L greater trochanter on 10/16/2017 11:53 AM Indications: pain and diagnostic evaluation Details: 22 G 1.5 in needle, lateral approach  Arthrogram: No  Medications: 2 mL lidocaine 1 %; 2 mL bupivacaine 0.5 %; 40 mg methylPREDNISolone acetate 40 MG/ML Procedure, treatment alternatives, risks and benefits explained, specific risks discussed. Consent was given by the patient. Immediately prior to procedure a time out was called to verify the correct patient, procedure, equipment, support staff and site/side marked as required. Patient was prepped and draped in the usual sterile fashion.       Clinical Data: No additional findings.   Subjective: Chief Complaint  Patient presents with  . Left Hip - Pain  . New Patient (Initial Visit)    L HIP PIAN FOR 4-5  WEEKS WORSE WHEN SITTING FOR LONG, AND CROSSING LEGS WHEN SITS. USED HEAT AND ICE AND CANT LAY ON THE LEFT SIDE    HPI  Jacob Stone is a very pleasant 67 year old white male who is seen today for evaluation of pain along the left lateral aspect of his hip.  Is been present for the last 4 to 5weeks which unfortunately worsens when he is sitting and also when he crosses his legs.  He also notes that he cannot lie on that side without having pain along the lateral aspect of his left hip.  He denies any groin pain.  Denies any posterior pain.  This pain is originating at the left trochanteric region and he states it does come down to about mid lateral thigh.  Any other radicular type symptoms.  He does not have problems in the gym with doing any type of leg work.  No tingling.  He does have early diabetes and apparently does take metformin basically prophylactically.  He is not testing his sugars.  Review of Systems  Constitutional: Negative for fatigue and fever.  HENT: Negative for ear pain.   Eyes: Negative for pain.  Respiratory: Negative for cough and shortness of breath.   Cardiovascular: Negative for leg swelling.  Gastrointestinal: Negative for constipation and diarrhea.  Genitourinary: Negative for difficulty urinating.  Musculoskeletal: Positive  for back pain. Negative for neck pain.  Skin: Negative for rash.  Allergic/Immunologic: Negative for food allergies.  Neurological: Positive for weakness. Negative for numbness.  Hematological: Does not bruise/bleed easily.  Psychiatric/Behavioral: Positive for sleep disturbance.     Objective: Vital Signs: BP 109/79 (BP Location: Left Arm, Patient Position: Sitting, Cuff Size: Normal)   Pulse 76   Ht 5\' 9"  (1.753 m)   Wt 185 lb (83.9 kg)   BMI 27.32 kg/m   Physical Exam  Constitutional: He is oriented to person, place, and time. He appears well-developed and well-nourished.  HENT:  Mouth/Throat: Oropharynx is clear and moist.  Eyes:  Pupils are equal, round, and reactive to light. EOM are normal.  Pulmonary/Chest: Effort normal.  Neurological: He is alert and oriented to person, place, and time.  Skin: Skin is warm and dry.  Psychiatric: He has a normal mood and affect. His behavior is normal.    Ortho Exam  Today he has good motion of both hips.  He is tender to palpation over the left trochanteric region.  No pain with abduction or or abduction.  No pain with internal and external rotation.  Negative straight leg raising.  Neurovascularly intact distally.  Specialty Comments:  No specialty comments available.  Imaging: Xr Hip Unilat W Or W/o Pelvis 2-3 Views Left  Result Date: 10/16/2017 2 view x-ray of the left hip and pelvis reveals some SI degenerative changes. There appears to be a cyst in the left femoral neck subcapital area.  Prostatic seeds are noted.  X-rays reviewed with Dr.Whitfield and he concurs with this.    PMFS History: Current Outpatient Medications  Medication Sig Dispense Refill  . allopurinol (ZYLOPRIM) 300 MG tablet TAKE 1 TABLET BY MOUTH ONCE DAILY 90 tablet 3  . amLODipine (NORVASC) 10 MG tablet TAKE 1 TABLET BY MOUTH ONCE DAILY 90 tablet 0  . aspirin 81 MG chewable tablet Chew by mouth.    . cetirizine (ZYRTEC) 10 MG tablet Take 10 mg by mouth daily.      . Docusate Calcium ([employer] STOOL SOFTENER PO) Take by mouth daily.     . hydrochlorothiazide (HYDRODIURIL) 12.5 MG tablet TAKE ONE TABLET BY MOUTH ONCE DAILY 90 tablet 3  . losartan (COZAAR) 25 MG tablet TAKE 1 TABLET BY MOUTH ONCE DAILY 90 tablet 3  . metFORMIN (GLUCOPHAGE) 500 MG tablet 1 tablets before breakfast 100 tablet 4  . Misc Natural Products (FIBER 7 PO) Take 2 tablets by mouth daily.     . Multiple Vitamins-Minerals (DAILY MULTI 50+ PO) Take 1 tablet by mouth daily.    Marland Kitchen omeprazole (PRILOSEC) 20 MG capsule Take 1 capsule (20 mg total) by mouth daily. 100 capsule 4  . simvastatin (ZOCOR) 20 MG tablet TAKE 1 TABLET BY MOUTH  EVERY NIGHT AT BEDTIME 90 tablet 3   No current facility-administered medications for this visit.     Patient Active Problem List   Diagnosis Date Noted  . Malignant neoplasm of prostate ([facility]) 04/16/2017  . Unilateral primary osteoarthritis, left knee 11/16/2016  . OSA (obstructive sleep apnea) 04/18/2016  . Sleep dysfunction with arousal disturbance 01/17/2016  . Reflux esophagitis 01/17/2016  . Hyperlipidemia 09/14/2014  . Diabetes 1.5, managed as type 2 ([facility]) 07/07/2014  . Carotid artery calcification 03/02/2011  . Malignant neoplasm of kidney excluding renal pelvis ([facility]) 11/14/2007  . ALLERGIC RHINITIS 11/14/2007  . DIVERTICULOSIS, COLON 04/16/2007  . Essential hypertension 04/16/2007  . DIVERTICULITIS, HX OF 04/16/2007   Past Medical History:  Diagnosis Date  . Carotid artery calcification 03/2011   MILD HARD PLAQUE LEFT BULB, 0-39% BIL. ICA STENOSIS, PATENT VERTEBRAL ARTERIES WITH ANTEGRADE FLOW  . Diabetes type 2, controlled ([facility])   . Diverticulitis   . Fatty liver   . GERD (gastroesophageal reflux disease)   . Gout   . History of kidney stones    Left kidney  . HTN (hypertension)   . Hyperlipidemia   . Malignant neoplasm of kidney excluding renal pelvis ([facility])    Right Kidney  . OA (osteoarthritis)    Knee  . OSA (obstructive sleep apnea) 04/18/2016   SEVERE; UNABLE TO WEAR CPAP MORE THAN 2-3 HOURS PER NIGHT  . Plantar fasciitis   . Prostate cancer ([facility])   . Seasonal allergies     Family History  Problem Relation Age of Onset  . Heart disease Mother        heart failure  . Stroke Mother   . Cancer Father        wagners  . Glaucoma Father        wagoner's glaucoma  . Hypertension Other        multiple family members    Past Surgical History:  Procedure Laterality Date  . COLON SURGERY  2011   Partial colon removed due to diverticulitis  . COLONOSCOPY  08/2009  . NEPHRECTOMY Right   . PROSTATE BIOPSY    . RADIOACTIVE SEED IMPLANT N/A 08/03/2017    Procedure: RADIOACTIVE SEED IMPLANT/BRACHYTHERAPY IMPLANT;  Surgeon: Kathie Rhodes, MD;  Location[facility];  Service: Urology;  Laterality: N/A;  . SPACE OAR INSTILLATION N/A 08/03/2017   Procedure: SPACE OAR INSTILLATION;  Surgeon: Kathie Rhodes, MD;  Location: [facility];  Service: Urology;  Laterality: N/A;   Social History   Occupational History  . Occupation: retired    Comment: Physiological scientist  Tobacco Use  . Smoking status: Never Smoker  . Smokeless tobacco: Never Used  Substance and Sexual Activity  . Alcohol use: Yes    Alcohol/week: 1.0 standard drinks    Types: 1 Cans of beer per week  . Drug use: No  . Sexual activity: Yes

## 2017-10-16 NOTE — Progress Notes (Signed)
Received voicemail message from [place], [place] for Dr. Joni Fears inquiring if patient is safe to have MRI following prostate seed implant on 08/03/2017. He implied that Dr. Karsten Ro instructed him to "ask Dr. Tammi Klippel." Phoned him back and explained it is safe at anytime for the patient to have an MRI. Reassured him the seeds are titanium and Dr. Tammi Klippel performs an MRI to confirm SpaceOar placement just two weeks s/p implant on some of these patients. Jacob Stone verbalized understanding and expressed appreciation for the return call.

## 2017-10-30 ENCOUNTER — Telehealth (INDEPENDENT_AMBULATORY_CARE_PROVIDER_SITE_OTHER): Payer: Self-pay | Admitting: Orthopaedic Surgery

## 2017-10-30 NOTE — Telephone Encounter (Signed)
PLEASE ADVISE.

## 2017-10-30 NOTE — Telephone Encounter (Signed)
Patient calling to let you know lt hip injection he received last visit helped with pain for about two days. Pain now back to were it was when he first came in. Pain waking him up at night when he rolls over on that side. Patient would like to know next step. Please call to advise.

## 2017-10-30 NOTE — Telephone Encounter (Signed)
Try voltaren gel. If no improvement then will obtain MRI

## 2017-10-31 ENCOUNTER — Other Ambulatory Visit (INDEPENDENT_AMBULATORY_CARE_PROVIDER_SITE_OTHER): Payer: Self-pay | Admitting: Radiology

## 2017-10-31 MED ORDER — DICLOFENAC SODIUM 1 % TD GEL: TRANSDERMAL | 5 refills | Status: DC

## 2017-10-31 NOTE — Telephone Encounter (Signed)
WHITFIELD ORDERED VOLTAREN GEL. I NOTIFIED PT AND TOLD IF NO BETTER TO FOLLOW UP AND WE WILL ORDER MRI PER WHITFIELDS NOTE

## 2017-11-23 ENCOUNTER — Ambulatory Visit (INDEPENDENT_AMBULATORY_CARE_PROVIDER_SITE_OTHER): Payer: Medicare Other

## 2017-11-23 DIAGNOSIS — Z23 Encounter for immunization: Secondary | ICD-10-CM

## 2017-11-28 DIAGNOSIS — R3911 Hesitancy of micturition: Secondary | ICD-10-CM | POA: Diagnosis not present

## 2017-12-03 ENCOUNTER — Other Ambulatory Visit: Payer: Self-pay | Admitting: Family Medicine

## 2017-12-06 NOTE — Progress Notes (Signed)
Subjective:   Jacob Stone. is a 67 y.o. male who presents for Medicare Annual (Subsequent) preventive examination.  Reports health as good  Recently seen for pain in left hip  Kidney removed 2006- went to ER and did scan saw; small cancerous  Seen [facility] 08/16/2017 - c/o of tingling after Prostate seed implant - Dr. Karsten Ro 08/03/2017 16th week visit PSA down now  Works part time job [place] the [employer]    [place] down Chol/hdl 3  A1c 6.5 / BS 149  Chol/ hdl 3  Educated on 149; and elevated A1c  Has one complete meal a day The second meal is sandwich Supper;   Exercise Bursitis and does not stop him Walks greater than 2 miles per day Some times 2 or 4 to 5 miles   Diabetic eye exam 11/2014   Health Maintenance Due  Topic Date Due  . Hepatitis C Screening  04-29-1950  . OPHTHALMOLOGY EXAM  12/07/2015  . FOOT EXAM  03/28/2016     Educated on Hep c Will have annual visit for eye exam with Dr. Threasa Alpha care reviewed   Educated regarding foot pain   Has recurrent corn on right foot outside below the small toe       Objective:     Vitals: BP 118/80   Pulse 76   Ht 5\' 9"  (1.753 m)   Wt 186 lb 5 oz (84.5 kg)   SpO2 97%   BMI 27.51 kg/m   Body mass index is 27.51 kg/m.  Advanced Directives 08/03/2017  Does Patient Have a Medical Advance Directive? No  Would patient like information on creating a medical advance directive? Yes (MAU/Ambulatory/Procedural Areas - Information given)   They have not done this  Agreed to take [place] forms home to review   Tobacco Social History   Tobacco Use  Smoking Status Never Smoker  Smokeless Tobacco Never Used     Counseling given: Yes   Clinical Intake:     Past Medical History:  Diagnosis Date  . Carotid artery calcification 03/2011   MILD HARD PLAQUE LEFT BULB, 0-39% BIL. ICA STENOSIS, PATENT VERTEBRAL ARTERIES WITH ANTEGRADE FLOW  . Diabetes type 2, controlled ([facility])   .  Diverticulitis   . Fatty liver   . GERD (gastroesophageal reflux disease)   . Gout   . History of kidney stones    Left kidney  . HTN (hypertension)   . Hyperlipidemia   . Malignant neoplasm of kidney excluding renal pelvis ([facility])    Right Kidney  . OA (osteoarthritis)    Knee  . OSA (obstructive sleep apnea) 04/18/2016   SEVERE; UNABLE TO WEAR CPAP MORE THAN 2-3 HOURS PER NIGHT  . Plantar fasciitis   . Prostate cancer ([facility])   . Seasonal allergies    Past Surgical History:  Procedure Laterality Date  . COLON SURGERY  2011   Partial colon removed due to diverticulitis  . COLONOSCOPY  08/2009  . NEPHRECTOMY Right   . PROSTATE BIOPSY    . RADIOACTIVE SEED IMPLANT N/A 08/03/2017   Procedure: RADIOACTIVE SEED IMPLANT/BRACHYTHERAPY IMPLANT;  Surgeon: Kathie Rhodes, MD;  Location: [facility];  Service: Urology;  Laterality: N/A;  . SPACE OAR INSTILLATION N/A 08/03/2017   Procedure: SPACE OAR INSTILLATION;  Surgeon: Kathie Rhodes, MD;  Location: [facility];  Service: Urology;  Laterality: N/A;   Family History  Problem Relation Age of Onset  . Heart disease Mother  heart failure  . Stroke Mother   . Cancer Father        wagners  . Glaucoma Father        wagoner's glaucoma  . Hypertension Other        multiple family members   Social History   Socioeconomic History  . Marital status: Married    Spouse name: Not on file  . Number of children: Not on file  . Years of education: Not on file  . Highest education level: Not on file  Occupational History  . Occupation: retired    Comment: Physiological scientist  Social Needs  . Financial resource strain: Not on file  . Food insecurity:    Worry: Not on file    Inability: Not on file  . Transportation needs:    Medical: Not on file    Non-medical: Not on file  Tobacco Use  . Smoking status: Never Smoker  . Smokeless tobacco: Never Used  Substance and Sexual Activity  . Alcohol use: Yes     Alcohol/week: 1.0 standard drinks    Types: 1 Cans of beer per week    Comment: rare  . Drug use: No  . Sexual activity: Yes  Lifestyle  . Physical activity:    Days per week: Not on file    Minutes per session: Not on file  . Stress: Not on file  Relationships  . Social connections:    Talks on phone: Not on file    Gets together: Not on file    Attends religious service: Not on file    Active member of club or organization: Not on file    Attends meetings of clubs or organizations: Not on file    Relationship status: Not on file  Other Topics Concern  . Not on file  Social History Narrative  . Not on file    Outpatient Encounter Medications as of 12/10/2017  Medication Sig  . allopurinol (ZYLOPRIM) 300 MG tablet TAKE 1 TABLET BY MOUTH ONCE DAILY  . amLODipine (NORVASC) 10 MG tablet TAKE 1 TABLET BY MOUTH ONCE DAILY  . aspirin 81 MG chewable tablet Chew by mouth.  . cetirizine (ZYRTEC) 10 MG tablet Take 10 mg by mouth daily.    . diclofenac sodium (VOLTAREN) 1 % GEL apply 2-4 grams to large joint area up to four times a day as needed  . Docusate Calcium (CVS STOOL SOFTENER PO) Take by mouth daily.   . hydrochlorothiazide (HYDRODIURIL) 12.5 MG tablet TAKE ONE TABLET BY MOUTH ONCE DAILY  . losartan (COZAAR) 25 MG tablet TAKE 1 TABLET BY MOUTH ONCE DAILY  . metFORMIN (GLUCOPHAGE) 500 MG tablet 1 tablets before breakfast  . Misc Natural Products (FIBER 7 PO) Take 2 tablets by mouth daily.   . Multiple Vitamins-Minerals (DAILY MULTI 50+ PO) Take 1 tablet by mouth daily.  Marland Kitchen omeprazole (PRILOSEC) 20 MG capsule Take 1 capsule (20 mg total) by mouth daily.  . simvastatin (ZOCOR) 20 MG tablet TAKE 1 TABLET BY MOUTH EVERY NIGHT AT BEDTIME   No facility-administered encounter medications on file as of 12/10/2017.     Activities of Daily Living In your present state of health, do you have any difficulty performing the following activities: 08/03/2017  Hearing? N  Vision? N  Difficulty  concentrating or making decisions? N  Walking or climbing stairs? N  Dressing or bathing? N  Some recent data might be hidden    Patient Care Team: Dorena Cookey, MD as  PCP - General    Assessment:   This is a routine wellness examination for Jashaun.  Exercise Activities and Dietary recommendations    Goals    . Patient Stated     Will keep working to reduce weight  Check out  online nutrition programs as GumSearch.nl and http://vang.com/; fit61me; Look for foods with "whole" wheat; bran; oatmeal etc Shot at the [employer] in season for fresher choices  Watch for "hydrogenated" on the label of oils which are trans-fats.  Watch for "high fructose corn syrup" in snacks, yogurt or ketchup  Meats have less marbling; bright colored fruits and vegetables;  Canned; dump out liquid and wash vegetables. Be mindful of what we are eating  Portion control is essential to a health weight! Sit down; take a break and enjoy your meal; take smaller bites; put the fork down between bites;  It takes 20 minutes to get full; so check in with your fullness cues and stop eating when you start to fill full              Fall Risk Fall Risk  04/16/2017 06/19/2016 03/29/2015 03/09/2014  Falls in the past year? No No No No     Depression Screen PHQ 2/9 Scores 04/16/2017 06/19/2016 03/29/2015 03/09/2014  PHQ - 2 Score 0 0 0 0     Cognitive Function   Ad8 score reviewed for issues:  Issues making decisions:  Less interest in hobbies / activities:  Repeats questions, stories (family complaining):  Trouble using ordinary gadgets (microwave, computer, phone):  Forgets the month or year:   Mismanaging finances:   Remembering appts:  Daily problems with thinking and/or memory: Ad8 score is=0 Presents slowly - has seen memory loss and is not presenting with issues at present          Immunization History  Administered Date(s) Administered  . Influenza Split 11/18/2010,  11/10/2011  . Influenza Whole 11/09/2009  . Influenza, High Dose Seasonal PF 11/17/2016, 11/23/2017  . Influenza,inj,Quad PF,6+ Mos 11/28/2012  . Influenza-Unspecified 12/02/2013, 11/28/2014, 11/21/2015  . Pneumococcal Conjugate-13 04/06/2014  . Pneumococcal Polysaccharide-23 06/18/2017  . Td 07/30/2002, 04/06/2014  . Zoster 03/02/2011     Screening Tests Health Maintenance  Topic Date Due  . Hepatitis C Screening  October 30, 1950  . OPHTHALMOLOGY EXAM  12/07/2015  . FOOT EXAM  03/28/2016  . HEMOGLOBIN A1C  12/19/2017  . COLONOSCOPY  07/08/2019  . TETANUS/TDAP  04/05/2024  . INFLUENZA VACCINE  Completed  . PNA vac Low Risk Adult  Completed        Plan:      PCP Notes   Health Maintenance   Educated on Hep c Will have annual visit for eye exam with Dr. Threasa Alpha care reviewed   Educated regarding foot pain   Has recurrent corn on right foot outside below the small toe   Has had flu vaccine   Abnormal Screens  None   Referrals  none  Patient concerns; None; wife notes hearing issues and did give him resources list but no conversational issues today   Nurse Concerns; As noted; c/o of corn periodically on right foot, educated that he can fup with a podiatrist if needed.  Educated on Diabetic numbers. Also is still trying to lose weight. Lost 67 yo 8 lbs since last year and is trying to lose a few more   Next PCP apt In March and is scheduled to see [facility]      I have  personally reviewed and noted the following in the patient's chart:   . Medical and social history . Use of alcohol, tobacco or illicit drugs  . Current medications and supplements . Functional ability and status . Nutritional status . Physical activity . Advanced directives . List of other physicians . Hospitalizations, surgeries, and ER visits in previous 12 months . Vitals . Screenings to include cognitive, depression, and falls . Referrals and appointments  In addition, I  have reviewed and discussed with patient certain preventive protocols, quality metrics, and best practice recommendations. A written personalized care plan for preventive services as well as general preventive health recommendations were provided to patient.     Wynetta Fines, RN  12/10/2017

## 2017-12-10 ENCOUNTER — Ambulatory Visit (INDEPENDENT_AMBULATORY_CARE_PROVIDER_SITE_OTHER): Payer: Medicare Other

## 2017-12-10 VITALS — BP 118/80 | HR 76 | Ht 69.0 in | Wt 186.3 lb

## 2017-12-10 DIAGNOSIS — Z0000 Encounter for general adult medical examination without abnormal findings: Secondary | ICD-10-CM

## 2017-12-10 NOTE — Progress Notes (Signed)
I have reviewed documentation for AWV and Advance Care Planning provided by the health coach and agree with documentation. I was immediately available for questions.  

## 2017-12-10 NOTE — Patient Instructions (Addendum)
[name] , Thank you for taking time to come for your Medicare Wellness Visit. I appreciate your ongoing commitment to your health goals. Please review the following plan we discussed and let me know if I can assist you in the future.   Eye exam was completed last year and will schedule annual visit with Nolene Bernheim is a vaccine for the prevention of Shingles in Adults 69 and older.  If you are on Medicare, the shingrix is covered under your Part D plan, so you will take both of the vaccines in the series at your pharmacy. Please check with your benefits regarding applicable copays or out of pocket expenses.  The Shingrix is given in 2 vaccines approx 8 weeks apart. You must receive the 2nd dose prior to 6 months from receipt of the first. Please have the pharmacist print out you Immunization  dates for our office records   Medicare now request all "baby boomers" test for possible exposure to Hepatitis C. Many may have been exposed due to dental work, tatoo's, vaccinations when young. The Hepatitis C virus is dormant for many years and then sometimes will cause liver cancer. If you gave blood in the past 15 years, you were most likely checked for Hep C. If you rec'd blood; you may want to consider testing or if you are high risk for any other reason.   You can have a hearing screen any where you would like     These are the goals we discussed: Goals    . Patient Stated     Will keep working to reduce weight  Check out  online nutrition programs as GumSearch.nl and http://vang.com/; fit54me; Look for foods with "whole" wheat; bran; oatmeal etc Shot at the [employer] in season for fresher choices  Watch for "hydrogenated" on the label of oils which are trans-fats.  Watch for "high fructose corn syrup" in snacks, yogurt or ketchup  Meats have less marbling; bright colored fruits and vegetables;  Canned; dump out liquid and wash vegetables. Be mindful of what we are eating   Portion control is essential to a health weight! Sit down; take a break and enjoy your meal; take smaller bites; put the fork down between bites;  It takes 20 minutes to get full; so check in with your fullness cues and stop eating when you start to fill full              This is a list of the screening recommended for you and due dates:  Health Maintenance  Topic Date Due  .  Hepatitis C: One time screening is recommended by Center for Disease Control  (CDC) for  adults born from [birth year] through 1967.   01-03-1951  . Eye exam for diabetics  12/07/2015  . Complete foot exam   03/28/2016  . Hemoglobin A1C  12/19/2017  . Colon Cancer Screening  07/08/2019  . Tetanus Vaccine  04/05/2024  . Flu Shot  Completed  . Pneumonia vaccines  Completed        Diabetes and Foot Care Diabetes may cause you to have problems because of poor blood supply (circulation) to your feet and legs. This may cause the skin on your feet to become thinner, break easier, and heal more slowly. Your skin may become dry, and the skin may peel and crack. You may also have nerve damage in your legs and feet causing decreased feeling in them. You may not notice minor injuries to your  feet that could lead to infections or more serious problems. Taking care of your feet is one of the most important things you can do for yourself. Follow these instructions at home:  Wear shoes at all times, even in the house. Do not go barefoot. Bare feet are easily injured.  Check your feet daily for blisters, cuts, and redness. If you cannot see the bottom of your feet, use a mirror or ask someone for help.  Wash your feet with warm water (do not use hot water) and mild soap. Then pat your feet and the areas between your toes until they are completely dry. Do not soak your feet as this can dry your skin.  Apply a moisturizing lotion or petroleum jelly (that does not contain alcohol and is unscented) to the skin on your feet and to  dry, brittle toenails. Do not apply lotion between your toes.  Trim your toenails straight across. Do not dig under them or around the cuticle. File the edges of your nails with an emery board or nail file.  Do not cut corns or calluses or try to remove them with medicine.  Wear clean socks or stockings every day. Make sure they are not too tight. Do not wear knee-high stockings since they may decrease blood flow to your legs.  Wear shoes that fit properly and have enough cushioning. To break in new shoes, wear them for just a few hours a day. This prevents you from injuring your feet. Always look in your shoes before you put them on to be sure there are no objects inside.  Do not cross your legs. This may decrease the blood flow to your feet.  If you find a minor scrape, cut, or break in the skin on your feet, keep it and the skin around it clean and dry. These areas may be cleansed with mild soap and water. Do not cleanse the area with peroxide, alcohol, or iodine.  When you remove an adhesive bandage, be sure not to damage the skin around it.  If you have a wound, look at it several times a day to make sure it is healing.  Do not use heating pads or hot water bottles. They may burn your skin. If you have lost feeling in your feet or legs, you may not know it is happening until it is too late.  Make sure your health care provider performs a complete foot exam at least annually or more often if you have foot problems. Report any cuts, sores, or bruises to your health care provider immediately. Contact a health care provider if:  You have an injury that is not healing.  You have cuts or breaks in the skin.  You have an ingrown nail.  You notice redness on your legs or feet.  You feel burning or tingling in your legs or feet.  You have pain or cramps in your legs and feet.  Your legs or feet are numb.  Your feet always feel cold. Get help right away if:  There is increasing  redness, swelling, or pain in or around a wound.  There is a red line that goes up your leg.  Pus is coming from a wound.  You develop a fever or as directed by your health care provider.  You notice a bad smell coming from an ulcer or wound. This information is not intended to replace advice given to you by your health care provider. Make sure you discuss any questions  you have with your health care provider. Document Released: 01/21/2000 Document Revised: 07/01/2015 Document Reviewed: 07/02/2012 Elsevier Interactive Patient Education  2017 [place] Prevention in the Home Falls can cause injuries. They can happen to people of all ages. There are many things you can do to make your home safe and to help prevent falls. What can I do on the outside of my home?  Regularly fix the edges of walkways and driveways and fix any cracks.  Remove anything that might make you trip as you walk through a door, such as a raised step or threshold.  Trim any bushes or trees on the path to your home.  Use bright outdoor lighting.  Clear any walking paths of anything that might make someone trip, such as rocks or tools.  Regularly check to see if handrails are loose or broken. Make sure that both sides of any steps have handrails.  Any raised decks and porches should have guardrails on the edges.  Have any leaves, snow, or ice cleared regularly.  Use sand or salt on walking paths during winter.  Clean up any spills in your garage right away. This includes oil or grease spills. What can I do in the bathroom?  Use night lights.  Install grab bars by the toilet and in the tub and shower. Do not use towel bars as grab bars.  Use non-skid mats or decals in the tub or shower.  If you need to sit down in the shower, use a plastic, non-slip stool.  Keep the floor dry. Clean up any water that spills on the floor as soon as it happens.  Remove soap buildup in the tub or shower  regularly.  Attach bath mats securely with double-sided non-slip rug tape.  Do not have throw rugs and other things on the floor that can make you trip. What can I do in the bedroom?  Use night lights.  Make sure that you have a light by your bed that is easy to reach.  Do not use any sheets or blankets that are too big for your bed. They should not hang down onto the floor.  Have a firm chair that has side arms. You can use this for support while you get dressed.  Do not have throw rugs and other things on the floor that can make you trip. What can I do in the kitchen?  Clean up any spills right away.  Avoid walking on wet floors.  Keep items that you use a lot in easy-to-reach places.  If you need to reach something above you, use a strong step stool that has a grab bar.  Keep electrical cords out of the way.  Do not use floor polish or wax that makes floors slippery. If you must use wax, use non-skid floor wax.  Do not have throw rugs and other things on the floor that can make you trip. What can I do with my stairs?  Do not leave any items on the stairs.  Make sure that there are handrails on both sides of the stairs and use them. Fix handrails that are broken or loose. Make sure that handrails are as long as the stairways.  Check any carpeting to make sure that it is firmly attached to the stairs. Fix any carpet that is loose or worn.  Avoid having throw rugs at the top or bottom of the stairs. If you do have throw rugs, attach them to the floor with  carpet tape.  Make sure that you have a light switch at the top of the stairs and the bottom of the stairs. If you do not have them, ask someone to add them for you. What else can I do to help prevent falls?  Wear shoes that: ? Do not have high heels. ? Have rubber bottoms. ? Are comfortable and fit you well. ? Are closed at the toe. Do not wear sandals.  If you use a stepladder: ? Make sure that it is fully  opened. Do not climb a closed stepladder. ? Make sure that both sides of the stepladder are locked into place. ? Ask someone to hold it for you, if possible.  Clearly mark and make sure that you can see: ? Any grab bars or handrails. ? First and last steps. ? Where the edge of each step is.  Use tools that help you move around (mobility aids) if they are needed. These include: ? Canes. ? Walkers. ? Scooters. ? Crutches.  Turn on the lights when you go into a dark area. Replace any light bulbs as soon as they burn out.  Set up your furniture so you have a clear path. Avoid moving your furniture around.  If any of your floors are uneven, fix them.  If there are any pets around you, be aware of where they are.  Review your medicines with your doctor. Some medicines can make you feel dizzy. This can increase your chance of falling. Ask your doctor what other things that you can do to help prevent falls. This information is not intended to replace advice given to you by your health care provider. Make sure you discuss any questions you have with your health care provider. Document Released: 11/19/2008 Document Revised: 07/01/2015 Document Reviewed: 02/27/2014 Elsevier Interactive Patient Education  2018 [place] Maintenance, Male A healthy lifestyle and preventive care is important for your health and wellness. Ask your health care provider about what schedule of regular examinations is right for you. What should I know about weight and diet? Eat a Healthy Diet  Eat plenty of vegetables, fruits, whole grains, low-fat dairy products, and lean protein.  Do not eat a lot of foods high in solid fats, added sugars, or salt.  Maintain a Healthy Weight Regular exercise can help you achieve or maintain a healthy weight. You should:  Do at least 150 minutes of exercise each week. The exercise should increase your heart rate and make you sweat (moderate-intensity  exercise).  Do strength-training exercises at least twice a week.  Watch Your Levels of Cholesterol and Blood Lipids  Have your blood tested for lipids and cholesterol every 5 years starting at 69 years of age. If you are at high risk for heart disease, you should start having your blood tested when you are 69 years old. You may need to have your cholesterol levels checked more often if: ? Your lipid or cholesterol levels are high. ? You are older than 69 years of age. ? You are at high risk for heart disease.  What should I know about cancer screening? Many types of cancers can be detected early and may often be prevented. Lung Cancer  You should be screened every year for lung cancer if: ? You are a current smoker who has smoked for at least 30 years. ? You are a former smoker who has quit within the past 15 years.  Talk to your health care provider about your  screening options, when you should start screening, and how often you should be screened.  Colorectal Cancer  Routine colorectal cancer screening usually begins at 69 years of age and should be repeated every 5-10 years until you are 69 years old. You may need to be screened more often if early forms of precancerous polyps or small growths are found. Your health care provider may recommend screening at an earlier age if you have risk factors for colon cancer.  Your health care provider may recommend using home test kits to check for hidden blood in the stool.  A small camera at the end of a tube can be used to examine your colon (sigmoidoscopy or colonoscopy). This checks for the earliest forms of colorectal cancer.  Prostate and Testicular Cancer  Depending on your age and overall health, your health care provider may do certain tests to screen for prostate and testicular cancer.  Talk to your health care provider about any symptoms or concerns you have about testicular or prostate cancer.  Skin Cancer  Check your skin  from head to toe regularly.  Tell your health care provider about any new moles or changes in moles, especially if: ? There is a change in a mole's size, shape, or color. ? You have a mole that is larger than a pencil eraser.  Always use sunscreen. Apply sunscreen liberally and repeat throughout the day.  Protect yourself by wearing long sleeves, pants, a wide-brimmed hat, and sunglasses when outside.  What should I know about heart disease, diabetes, and high blood pressure?  If you are 61-55 years of age, have your blood pressure checked every 3-5 years. If you are 76 years of age or older, have your blood pressure checked every year. You should have your blood pressure measured twice-once when you are at a hospital or clinic, and once when you are not at a hospital or clinic. Record the average of the two measurements. To check your blood pressure when you are not at a hospital or clinic, you can use: ? An automated blood pressure machine at a pharmacy. ? A home blood pressure monitor.  Talk to your health care provider about your target blood pressure.  If you are between 40-75 years old, ask your health care provider if you should take aspirin to prevent heart disease.  Have regular diabetes screenings by checking your fasting blood sugar level. ? If you are at a normal weight and have a low risk for diabetes, have this test once every three years after the age of 66. ? If you are overweight and have a high risk for diabetes, consider being tested at a younger age or more often.  A one-time screening for abdominal aortic aneurysm (AAA) by ultrasound is recommended for men aged 47-75 years who are current or former smokers. What should I know about preventing infection? Hepatitis B If you have a higher risk for hepatitis B, you should be screened for this virus. Talk with your health care provider to find out if you are at risk for hepatitis B infection. Hepatitis C Blood testing is  recommended for:  Everyone born from [birth year] through 1965.  Anyone with known risk factors for hepatitis C.  Sexually Transmitted Diseases (STDs)  You should be screened each year for STDs including gonorrhea and chlamydia if: ? You are sexually active and are younger than 69 years of age. ? You are older than 69 years of age and your health care provider tells you  that you are at risk for this type of infection. ? Your sexual activity has changed since you were last screened and you are at an increased risk for chlamydia or gonorrhea. Ask your health care provider if you are at risk.  Talk with your health care provider about whether you are at high risk of being infected with HIV. Your health care provider may recommend a prescription medicine to help prevent HIV infection.  What else can I do?  Schedule regular health, dental, and eye exams.  Stay current with your vaccines (immunizations).  Do not use any tobacco products, such as cigarettes, chewing tobacco, and e-cigarettes. If you need help quitting, ask your health care provider.  Limit alcohol intake to no more than 2 drinks per day. One drink equals 12 ounces of beer, 5 ounces of wine, or 1 ounces of hard liquor.  Do not use street drugs.  Do not share needles.  Ask your health care provider for help if you need support or information about quitting drugs.  Tell your health care provider if you often feel depressed.  Tell your health care provider if you have ever been abused or do not feel safe at home. This information is not intended to replace advice given to you by your health care provider. Make sure you discuss any questions you have with your health care provider. Document Released: 07/22/2007 Document Revised: 09/22/2015 Document Reviewed: 10/27/2014 Elsevier Interactive Patient Education  [employer].

## 2017-12-24 ENCOUNTER — Other Ambulatory Visit: Payer: Self-pay | Admitting: Family Medicine

## 2017-12-25 ENCOUNTER — Telehealth (INDEPENDENT_AMBULATORY_CARE_PROVIDER_SITE_OTHER): Payer: Self-pay | Admitting: Orthopaedic Surgery

## 2017-12-25 ENCOUNTER — Other Ambulatory Visit (INDEPENDENT_AMBULATORY_CARE_PROVIDER_SITE_OTHER): Payer: Self-pay | Admitting: *Deleted

## 2017-12-25 DIAGNOSIS — M25552 Pain in left hip: Secondary | ICD-10-CM

## 2017-12-25 NOTE — Progress Notes (Signed)
Mr left  

## 2017-12-25 NOTE — Telephone Encounter (Signed)
yes

## 2017-12-25 NOTE — Telephone Encounter (Signed)
Patient left a message 11:02am, stating he has been using the Volteran Gel on his hip. Pain has come back, and gel no longer working. Patient would like to know next step. Please call to advise.

## 2017-12-25 NOTE — Telephone Encounter (Signed)
Last visit 10/17/17. Order MRI left hip?

## 2017-12-25 NOTE — Telephone Encounter (Signed)
MRI of left hip ordered, f/u apt schedule

## 2018-01-06 ENCOUNTER — Ambulatory Visit
Admission: RE | Admit: 2018-01-06 | Discharge: 2018-01-06 | Disposition: A | Discharge status: Home / Self Care | Payer: Medicare Other | Source: Ambulatory Visit | Attending: Orthopaedic Surgery | Admitting: Orthopaedic Surgery

## 2018-01-06 DIAGNOSIS — M25552 Pain in left hip: Secondary | ICD-10-CM

## 2018-01-10 DIAGNOSIS — C44519 Basal cell carcinoma of skin of other part of trunk: Secondary | ICD-10-CM | POA: Diagnosis not present

## 2018-01-10 DIAGNOSIS — Z85828 Personal history of other malignant neoplasm of skin: Secondary | ICD-10-CM | POA: Diagnosis not present

## 2018-01-10 DIAGNOSIS — L438 Other lichen planus: Secondary | ICD-10-CM | POA: Diagnosis not present

## 2018-01-10 DIAGNOSIS — L281 Prurigo nodularis: Secondary | ICD-10-CM | POA: Diagnosis not present

## 2018-01-10 DIAGNOSIS — L570 Actinic keratosis: Secondary | ICD-10-CM | POA: Diagnosis not present

## 2018-01-10 DIAGNOSIS — C44619 Basal cell carcinoma of skin of left upper limb, including shoulder: Secondary | ICD-10-CM | POA: Diagnosis not present

## 2018-01-10 DIAGNOSIS — D225 Melanocytic nevi of trunk: Secondary | ICD-10-CM | POA: Diagnosis not present

## 2018-01-14 ENCOUNTER — Ambulatory Visit (INDEPENDENT_AMBULATORY_CARE_PROVIDER_SITE_OTHER): Payer: Medicare Other | Admitting: Orthopaedic Surgery

## 2018-01-14 ENCOUNTER — Encounter (INDEPENDENT_AMBULATORY_CARE_PROVIDER_SITE_OTHER): Payer: Self-pay | Admitting: Orthopaedic Surgery

## 2018-01-14 VITALS — BP 114/74 | HR 74 | Ht 69.0 in | Wt 185.0 lb

## 2018-01-14 DIAGNOSIS — M25552 Pain in left hip: Secondary | ICD-10-CM

## 2018-01-14 DIAGNOSIS — R6889 Other general symptoms and signs: Secondary | ICD-10-CM | POA: Diagnosis not present

## 2018-01-14 MED ORDER — TRAMADOL HCL 50 MG PO TABS: 50.0000 mg | ORAL_TABLET | Freq: Four times a day (QID) | ORAL | 0 refills | Status: DC | PRN

## 2018-01-14 NOTE — Progress Notes (Signed)
Office Visit Note   Patient: Jacob Stone.           Date of Birth: Apr 13, 1950           MRN: [id number] Visit Date: 01/14/2018              Requested by: Dorena Cookey, MD No address on file PCP: Dorena Cookey, MD   Assessment & Plan: Visit Diagnoses:  1. Pain in left hip     Plan: Persistent pain in the area of his left hip.  MRI scan revealed some tendinopathy of the gluteus minimus probable small tear of the superior labrum.  There is some decreased motion of the left hip.  I think from a diagnostic that standpoint is worth having the hip injected by Dr. Ernestina Patches.  Return in 1 month to evaluate response.  Hopefully injection will make a difference.  If not we need to look elsewhere and possibly lumbar spine  Follow-Up Instructions: Return in about 1 month (around 02/14/2018).   Orders:  Orders Placed This Encounter  Procedures  . Ambulatory referral to Physical Medicine Rehab   Meds ordered this encounter  Medications  . traMADol (ULTRAM) 50 MG tablet    Sig: Take 1 tablet (50 mg total) by mouth every 6 (six) hours as needed.    Dispense:  30 tablet    Refill:  0      Procedures: No procedures performed   Clinical Data: No additional findings.   Subjective: Chief Complaint  Patient presents with  . Left Hip - Pain  . Hip Pain    Left hip MRI review. Increased pain when going from sitting position to standing, or when getting off ladder, anytime he pushes off of left leg. Mostly lateral hip pain with some radiating pain to lateral portion of left knee. Cannot lay on left side, very tender. Tylenol, no relief.   Cortisone injection over the area of tenderness the lateral aspect of the left hip was not helpful.  Thus, the reason for the MRI scan.  There is a small tear of the labrum and tendinopathy of the gluteus minimus this or tingling.  Very minimal back pain  HPI  Review of Systems   Objective: Vital Signs: BP 114/74 (BP Location: Right Arm,  Patient Position: Sitting, Cuff Size: Normal)   Pulse 74   Ht 5\' 9"  (1.753 m)   Wt 185 lb (83.9 kg)   BMI 27.32 kg/m   Physical Exam  Constitutional: He is oriented to person, place, and time. He appears well-developed and well-nourished.  HENT:  Mouth/Throat: Oropharynx is clear and moist.  Eyes: Pupils are equal, round, and reactive to light. EOM are normal.  Pulmonary/Chest: Effort normal.  Neurological: He is alert and oriented to person, place, and time.  Skin: Skin is warm and dry.  Psychiatric: He has a normal mood and affect. His behavior is normal.    Ortho Exam awake alert and oriented x3.  Comfortable sitting.  Does have some decreased motion of his left hip compared to the right and some pain that is referred along the lateral aspect of the left hip.  I could not locate any tenderness in lateral hip by palpation.  Skin intact.  Straight leg raise negative.  Specialty Comments:  No specialty comments available.  Imaging: No results found.   PMFS History: Patient Active Problem List   Diagnosis Date Noted  . Malignant neoplasm of prostate ([place]) 04/16/2017  . Unilateral primary  osteoarthritis, left knee 11/16/2016  . OSA (obstructive sleep apnea) 04/18/2016  . Sleep dysfunction with arousal disturbance 01/17/2016  . Reflux esophagitis 01/17/2016  . Hyperlipidemia 09/14/2014  . Diabetes 1.5, managed as type 2 ([place]) 07/07/2014  . Carotid artery calcification 03/02/2011  . Malignant neoplasm of kidney excluding renal pelvis ([place]) 11/14/2007  . ALLERGIC RHINITIS 11/14/2007  . DIVERTICULOSIS, COLON 04/16/2007  . Essential hypertension 04/16/2007  . DIVERTICULITIS, HX OF 04/16/2007   Past Medical History:  Diagnosis Date  . Carotid artery calcification 03/2011   MILD HARD PLAQUE LEFT BULB, 0-39% BIL. ICA STENOSIS, PATENT VERTEBRAL ARTERIES WITH ANTEGRADE FLOW  . Diabetes type 2, controlled ([place])   . Diverticulitis   . Fatty liver   . GERD (gastroesophageal reflux  disease)   . Gout   . History of kidney stones    Left kidney  . HTN (hypertension)   . Hyperlipidemia   . Malignant neoplasm of kidney excluding renal pelvis ([facility])    Right Kidney  . OA (osteoarthritis)    Knee  . OSA (obstructive sleep apnea) 04/18/2016   SEVERE; UNABLE TO WEAR CPAP MORE THAN 2-3 HOURS PER NIGHT  . Plantar fasciitis   . Prostate cancer ([place])   . Seasonal allergies     Family History  Problem Relation Age of Onset  . Heart disease Mother        heart failure - 48 and 29 when she passed   . Stroke Mother   . Cancer Father        wagners  . Glaucoma Father        wagoner's glaucoma  . Hypertension Other        multiple family members    Past Surgical History:  Procedure Laterality Date  . COLON SURGERY  2011   Partial colon removed due to diverticulitis  . COLONOSCOPY  08/2009  . NEPHRECTOMY Right   . PROSTATE BIOPSY    . RADIOACTIVE SEED IMPLANT N/A 08/03/2017   Procedure: RADIOACTIVE SEED IMPLANT/BRACHYTHERAPY IMPLANT;  Surgeon: Kathie Rhodes, MD;  Location: [facility];  Service: Urology;  Laterality: N/A;  . SPACE OAR INSTILLATION N/A 08/03/2017   Procedure: SPACE OAR INSTILLATION;  Surgeon: Kathie Rhodes, MD;  Location: [facility];  Service: Urology;  Laterality: N/A;   Social History   Occupational History  . Occupation: retired    Comment: Physiological scientist  Tobacco Use  . Smoking status: Never Smoker  . Smokeless tobacco: Never Used  Substance and Sexual Activity  . Alcohol use: Yes    Alcohol/week: 1.0 standard drinks    Types: 1 Cans of beer per week    Comment: rare  . Drug use: No  . Sexual activity: Yes     Garald Balding, MD   Note - This record has been created using [employer].  Chart creation errors have been sought, but may not always  have been located. Such creation errors do not reflect on  the standard of medical care.

## 2018-01-16 ENCOUNTER — Telehealth (INDEPENDENT_AMBULATORY_CARE_PROVIDER_SITE_OTHER): Payer: Self-pay | Admitting: Orthopaedic Surgery

## 2018-01-16 NOTE — Telephone Encounter (Signed)
Patient left a voicemail stating his pain has shifted to the back of his left buttock and lower back and has decided that he would like to be referred to Dr. Ernestina Patches for an injection.  Please advise

## 2018-01-17 ENCOUNTER — Other Ambulatory Visit (INDEPENDENT_AMBULATORY_CARE_PROVIDER_SITE_OTHER): Payer: Self-pay | Admitting: *Deleted

## 2018-01-17 DIAGNOSIS — M25552 Pain in left hip: Secondary | ICD-10-CM

## 2018-01-17 NOTE — Telephone Encounter (Signed)
FYI - Left hip injection ordered w/Dr. Ernestina Patches per office notes. LMOM

## 2018-01-17 NOTE — Telephone Encounter (Signed)
thanks

## 2018-02-08 ENCOUNTER — Encounter (INDEPENDENT_AMBULATORY_CARE_PROVIDER_SITE_OTHER): Payer: Self-pay | Admitting: Physical Medicine and Rehabilitation

## 2018-02-08 ENCOUNTER — Ambulatory Visit (INDEPENDENT_AMBULATORY_CARE_PROVIDER_SITE_OTHER): Payer: Self-pay

## 2018-02-08 ENCOUNTER — Ambulatory Visit (INDEPENDENT_AMBULATORY_CARE_PROVIDER_SITE_OTHER): Payer: Medicare Other | Admitting: Physical Medicine and Rehabilitation

## 2018-02-08 VITALS — BP 138/80 | HR 68 | Temp 97.9°F

## 2018-02-08 DIAGNOSIS — M25552 Pain in left hip: Secondary | ICD-10-CM

## 2018-02-08 NOTE — Progress Notes (Signed)
   Jacob Stone. - 68 y.o. male MRN [id number]  Date of birth: Dec 02, 1950  Office Visit Note: Visit Date: 02/08/2018 PCP: Dorena Cookey, MD Referred by: Dorena Cookey, MD  Subjective: No chief complaint on file.  HPI:  Jacob Stone. is a 68 y.o. male who comes in today For diagnostic and therapeutic intra-articular anesthetic hip arthrogram as requested by Dr. Joni Fears.  Chronic worsening left hip which is anterior lateral pain.  This is been worse over 4 months or so.  MRI shows small labral tear without severe arthritis.  ROS Otherwise per HPI.  Assessment & Plan: Visit Diagnoses:  1. Pain in left hip     Plan: Findings:  Diagnostic and hopefully therapeutic anesthetic hip arthrogram did show relief of symptoms during the anesthetic phase of the injection.    Meds & Orders: No orders of the defined types were placed in this encounter.   Orders Placed This Encounter  Procedures  . Large Joint Inj: L hip joint  . XR C-ARM NO REPORT    Follow-up: Return if symptoms worsen or fail to improve, for Joni Fears, MD.   Procedures: Large Joint Inj: L hip joint on 02/08/2018 2:20 PM Indications: pain and diagnostic evaluation Details: 22 G needle, anterior approach  Arthrogram: Yes  Medications: 80 mg triamcinolone acetonide 40 MG/ML; 3 mL bupivacaine 0.5 % Outcome: tolerated well, no immediate complications  Arthrogram demonstrated excellent flow of contrast throughout the joint surface without extravasation or obvious defect.  The patient had relief of symptoms during the anesthetic phase of the injection.  Procedure, treatment alternatives, risks and benefits explained, specific risks discussed. Consent was given by the patient. Immediately prior to procedure a time out was called to verify the correct patient, procedure, equipment, support staff and site/side marked as required. Patient was prepped and draped in the usual sterile fashion.      No notes  on file   Clinical History: No specialty comments available.     Objective:  VS:  HT:    WT:   BMI:     BP:138/80  HR:68bpm  TEMP:97.9 F (36.6 C)( )  RESP:  Physical Exam  Ortho Exam Imaging: No results found.

## 2018-02-08 NOTE — Patient Instructions (Signed)

## 2018-02-08 NOTE — Progress Notes (Signed)
 .  Numeric Pain Rating Scale and Functional Assessment Average Pain 4   In the last MONTH (on 0-10 scale) has pain interfered with the following?  1. General activity like being  able to carry out your everyday physical activities such as walking, climbing stairs, carrying groceries, or moving a chair?  Rating(4)   +Driver, -BT, -Dye Allergies.  

## 2018-02-14 ENCOUNTER — Ambulatory Visit: Payer: Self-pay

## 2018-02-14 MED ORDER — TRIAMCINOLONE ACETONIDE 40 MG/ML IJ SUSP
80.0000 mg | INTRAMUSCULAR | Status: AC | PRN
  Administered 2018-02-08: 80 mg via INTRA_ARTICULAR

## 2018-02-14 MED ORDER — BUPIVACAINE HCL 0.5 % IJ SOLN
3.0000 mL | INTRAMUSCULAR | Status: AC | PRN
  Administered 2018-02-08: 3 mL via INTRA_ARTICULAR

## 2018-02-14 NOTE — Telephone Encounter (Signed)
See note

## 2018-02-14 NOTE — Telephone Encounter (Signed)
Noted  

## 2018-02-14 NOTE — Telephone Encounter (Signed)
Pt called to report Rt jaw area swollen.  He states it has just started and is sore to touch. There is no redness. He rates the pain at 2.  He has no fever or sinus issues. He states it is mildy swollen and there my be a lump.  He sees no insect bite mark.Appointment arranged per protocol at [facility]. Care advice read to patient. Pt verbalized understanding of all instructions. Pt was advised to call his dentist for evaluation. Reason for Disposition . Swelling is painful to touch  Answer Assessment - Initial Assessment Questions 1. ONSET: "When did the swelling start?" (e.g., minutes, hours, days)     today 2. LOCATION: "What part of the face is swollen?"     Rt jaw 3. SEVERITY: "How swollen is it?"     Minor possible hard lump 4. ITCHING: "Is there any itching?" If so, ask: "How much?"   (Scale 1-10; mild, moderate or severe)     no 5. PAIN: "Is the swelling painful to touch?" If so, ask: "How painful is it?"   (Scale 1-10; mild, moderate or severe)     2 6. FEVER: "Do you have a fever?" If so, ask: "What is it, how was it measured, and when did it start?"      no 7. CAUSE: "What do you think is causing the face swelling?"     unsure 8. RECURRENT SYMPTOM: "Have you had face swelling before?" If so, ask: "When was the last time?" "What happened that time?"     no 9. OTHER SYMPTOMS: "Do you have any other symptoms?" (e.g., toothache, leg swelling)     no 10. PREGNANCY: "Is there any chance you are pregnant?" "When was your last menstrual period?"       N/A  Protocols used: [facility]

## 2018-02-15 ENCOUNTER — Ambulatory Visit: Payer: Medicare Other | Admitting: Physician Assistant

## 2018-02-15 NOTE — Progress Notes (Deleted)
Jacob Stone. is a 68 y.o. male here for a new problem.  I acted as a Education administrator for [employer], PA-C [employer], LPN  History of Present Illness:   No chief complaint on file.   HPI  Right jaw edema  Past Medical History:  Diagnosis Date  . Carotid artery calcification 03/2011   MILD HARD PLAQUE LEFT BULB, 0-39% BIL. ICA STENOSIS, PATENT VERTEBRAL ARTERIES WITH ANTEGRADE FLOW  . Diabetes type 2, controlled ([place])   . Diverticulitis   . Fatty liver   . GERD (gastroesophageal reflux disease)   . Gout   . History of kidney stones    Left kidney  . HTN (hypertension)   . Hyperlipidemia   . Malignant neoplasm of kidney excluding renal pelvis (HCC)    Right Kidney  . OA (osteoarthritis)    Knee  . OSA (obstructive sleep apnea) 04/18/2016   SEVERE; UNABLE TO WEAR CPAP MORE THAN 2-3 HOURS PER NIGHT  . Plantar fasciitis   . Prostate cancer ([place])   . Seasonal allergies      Social History   Socioeconomic History  . Marital status: Married    Spouse name: Not on file  . Number of children: Not on file  . Years of education: Not on file  . Highest education level: Not on file  Occupational History  . Occupation: retired    Comment: Physiological scientist  Social Needs  . Financial resource strain: Not on file  . Food insecurity:    Worry: Not on file    Inability: Not on file  . Transportation needs:    Medical: Not on file    Non-medical: Not on file  Tobacco Use  . Smoking status: Never Smoker  . Smokeless tobacco: Never Used  Substance and Sexual Activity  . Alcohol use: Yes    Alcohol/week: 1.0 standard drinks    Types: 1 Cans of beer per week    Comment: rare  . Drug use: No  . Sexual activity: Yes  Lifestyle  . Physical activity:    Days per week: Not on file    Minutes per session: Not on file  . Stress: Not on file  Relationships  . Social connections:    Talks on phone: Not on file    Gets together: Not on file    Attends religious service:  Not on file    Active member of club or organization: Not on file    Attends meetings of clubs or organizations: Not on file    Relationship status: Not on file  . Intimate partner violence:    Fear of current or ex partner: Not on file    Emotionally abused: Not on file    Physically abused: Not on file    Forced sexual activity: Not on file  Other Topics Concern  . Not on file  Social History Narrative  . Not on file    Past Surgical History:  Procedure Laterality Date  . COLON SURGERY  2011   Partial colon removed due to diverticulitis  . COLONOSCOPY  08/2009  . NEPHRECTOMY Right   . PROSTATE BIOPSY    . RADIOACTIVE SEED IMPLANT N/A 08/03/2017   Procedure: RADIOACTIVE SEED IMPLANT/BRACHYTHERAPY IMPLANT;  Surgeon: Kathie Rhodes, MD;  Location: [facility];  Service: Urology;  Laterality: N/A;  . SPACE OAR INSTILLATION N/A 08/03/2017   Procedure: SPACE OAR INSTILLATION;  Surgeon: Kathie Rhodes, MD;  Location: [facility];  Service: Urology;  Laterality: N/A;    Family History  Problem Relation Age of Onset  . Heart disease Mother        heart failure - 51 and 32 when she passed   . Stroke Mother   . Cancer Father        wagners  . Glaucoma Father        wagoner's glaucoma  . Hypertension Other        multiple family members    Allergies  Allergen Reactions  . Oxycodone Other (See Comments)    Patient stated he has nightmares with oxycodone    Current Medications:   Current Outpatient Medications:  .  allopurinol (ZYLOPRIM) 300 MG tablet, TAKE 1 TABLET BY MOUTH ONCE DAILY, Disp: 90 tablet, Rfl: 3 .  amLODipine (NORVASC) 10 MG tablet, TAKE 1 TABLET BY MOUTH ONCE DAILY, Disp: 90 tablet, Rfl: 0 .  aspirin 81 MG chewable tablet, Chew by mouth., Disp: , Rfl:  .  cetirizine (ZYRTEC) 10 MG tablet, Take 10 mg by mouth daily.  , Disp: , Rfl:  .  diclofenac sodium (VOLTAREN) 1 % GEL, apply 2-4 grams to large joint area up to four times a day as  needed (Patient not taking: Reported on 02/08/2018), Disp: 1 Tube, Rfl: 5 .  Docusate Calcium ([employer] STOOL SOFTENER PO), Take by mouth daily. , Disp: , Rfl:  .  hydrochlorothiazide (HYDRODIURIL) 12.5 MG tablet, TAKE ONE TABLET BY MOUTH ONCE DAILY, Disp: 90 tablet, Rfl: 3 .  losartan (COZAAR) 25 MG tablet, TAKE 1 TABLET BY MOUTH ONCE DAILY, Disp: 90 tablet, Rfl: 3 .  metFORMIN (GLUCOPHAGE) 500 MG tablet, TAKE 1 TABLET BY MOUTH BEFORE BREAKFAST, Disp: 90 tablet, Rfl: 1 .  Misc Natural Products (FIBER 7 PO), Take 2 tablets by mouth daily. , Disp: , Rfl:  .  Multiple Vitamins-Minerals (DAILY MULTI 50+ PO), Take 1 tablet by mouth daily., Disp: , Rfl:  .  omeprazole (PRILOSEC) 20 MG capsule, Take 1 capsule (20 mg total) by mouth daily. (Patient not taking: Reported on 02/08/2018), Disp: 100 capsule, Rfl: 4 .  simvastatin (ZOCOR) 20 MG tablet, Take 20 mg by mouth daily., Disp: , Rfl:  .  traMADol (ULTRAM) 50 MG tablet, Take 1 tablet (50 mg total) by mouth every 6 (six) hours as needed. (Patient not taking: Reported on 02/08/2018), Disp: 30 tablet, Rfl: 0   Review of Systems:   ROS  Vitals:   There were no vitals filed for this visit.   There is no height or weight on file to calculate BMI.  Physical Exam:   Physical Exam  Results for orders placed or performed in visit on 08/16/17  CBC with Differential/Platelet  Result Value Ref Range   WBC 8.6 4.0 - 10.5 K/uL   RBC 4.92 4.22 - 5.81 Mil/uL   Hemoglobin 15.4 13.0 - 17.0 g/dL   HCT 45.8 39.0 - 52.0 %   MCV 93.2 78.0 - 100.0 fl   MCHC 33.7 30.0 - 36.0 g/dL   RDW 13.8 11.5 - 15.5 %   Platelets 297.0 150.0 - 400.0 K/uL   Neutrophils Relative % 67.7 43.0 - 77.0 %   Lymphocytes Relative 20.4 12.0 - 46.0 %   Monocytes Relative 6.7 3.0 - 12.0 %   Eosinophils Relative 4.6 0.0 - 5.0 %   Basophils Relative 0.6 0.0 - 3.0 %   Neutro Abs 5.8 1.4 - 7.7 K/uL   Lymphs Abs 1.8 0.7 - 4.0 K/uL   Monocytes Absolute 0.6 0.1 -  1.0 K/uL   Eosinophils Absolute  0.4 0.0 - 0.7 K/uL   Basophils Absolute 0.0 0.0 - 0.1 K/uL  Basic Metabolic Panel  Result Value Ref Range   Sodium 141 135 - 145 mEq/L   Potassium 4.5 3.5 - 5.1 mEq/L   Chloride 102 96 - 112 mEq/L   CO2 29 19 - 32 mEq/L   Glucose, Bld 149 (H) 70 - 99 mg/dL   BUN 16 6 - 23 mg/dL   Creatinine, Ser 1.33 0.40 - 1.50 mg/dL   Calcium 9.7 8.4 - 10.5 mg/dL   GFR 56.58 (L) >60.00 mL/min  TSH  Result Value Ref Range   TSH 1.90 0.35 - 4.50 uIU/mL    Assessment and Plan:   There are no diagnoses linked to this encounter.  . Reviewed expectations re: course of current medical issues. . Discussed self-management of symptoms. . Outlined signs and symptoms indicating need for more acute intervention. . Patient verbalized understanding and all questions were answered. . See orders for this visit as documented in the electronic medical record. . Patient received an After-Visit Summary.  ***  Inda Coke, PA-C

## 2018-03-05 ENCOUNTER — Other Ambulatory Visit: Payer: Self-pay | Admitting: *Deleted

## 2018-03-05 MED ORDER — AMLODIPINE BESYLATE 10 MG PO TABS: 10.0000 mg | ORAL_TABLET | Freq: Every day | ORAL | 0 refills | Status: DC

## 2018-04-22 DIAGNOSIS — H524 Presbyopia: Secondary | ICD-10-CM | POA: Diagnosis not present

## 2018-04-22 DIAGNOSIS — H2513 Age-related nuclear cataract, bilateral: Secondary | ICD-10-CM | POA: Diagnosis not present

## 2018-04-22 DIAGNOSIS — Z7984 Long term (current) use of oral hypoglycemic drugs: Secondary | ICD-10-CM | POA: Diagnosis not present

## 2018-04-22 DIAGNOSIS — E119 Type 2 diabetes mellitus without complications: Secondary | ICD-10-CM | POA: Diagnosis not present

## 2018-04-22 LAB — HM DIABETES EYE EXAM

## 2018-05-01 ENCOUNTER — Ambulatory Visit (INDEPENDENT_AMBULATORY_CARE_PROVIDER_SITE_OTHER): Payer: Medicare Other | Admitting: Adult Health

## 2018-05-01 ENCOUNTER — Encounter: Payer: Self-pay | Admitting: Adult Health

## 2018-05-01 ENCOUNTER — Other Ambulatory Visit: Payer: Self-pay

## 2018-05-01 DIAGNOSIS — Z7689 Persons encountering health services in other specified circumstances: Secondary | ICD-10-CM

## 2018-05-01 DIAGNOSIS — E139 Other specified diabetes mellitus without complications: Secondary | ICD-10-CM | POA: Diagnosis not present

## 2018-05-01 DIAGNOSIS — G4733 Obstructive sleep apnea (adult) (pediatric): Secondary | ICD-10-CM

## 2018-05-01 DIAGNOSIS — E785 Hyperlipidemia, unspecified: Secondary | ICD-10-CM

## 2018-05-01 DIAGNOSIS — I10 Essential (primary) hypertension: Secondary | ICD-10-CM

## 2018-05-01 DIAGNOSIS — C61 Malignant neoplasm of prostate: Secondary | ICD-10-CM

## 2018-05-01 MED ORDER — SIMVASTATIN 20 MG PO TABS: 20.0000 mg | ORAL_TABLET | Freq: Every day | ORAL | 0 refills | Status: DC

## 2018-05-01 MED ORDER — AMLODIPINE BESYLATE 10 MG PO TABS: 10.0000 mg | ORAL_TABLET | Freq: Every day | ORAL | 0 refills | Status: DC

## 2018-05-01 NOTE — Progress Notes (Signed)
Virtual Visit via Video Note  I connected with@ on 05/01/18 at  9:00 AM EDT by a video enabled telemedicine application and verified that I am speaking with the correct person using two identifiers.  Location patient: home Location provider:work or home office Persons participating in the virtual visit: patient, provider  I discussed the limitations of evaluation and management by telemedicine and the availability of in person appointments. The patient expressed understanding and agreed to proceed.  This is an establish care visit.  He is a pleasant 68 year old male who  has a past medical history of Carotid artery calcification (03/2011), Diabetes type 2, controlled ([place]), Diverticulitis, Fatty liver, GERD (gastroesophageal reflux disease), Gout, History of kidney stones, HTN (hypertension), Hyperlipidemia, Malignant neoplasm of kidney excluding renal pelvis ([place]), OA (osteoarthritis), OSA (obstructive sleep apnea) (04/18/2016), Plantar fasciitis, Prostate cancer ([place]), and Seasonal allergies.   Former patient of Dr. Sherren Mocha.  His last physical was 06/2017   Acute Concerns: Establish Care  Chronic Issues: DM II -currently prescribed metformin 500 mg before breakfast.  He denies any GI issues or signs/symptoms of hypoglycemia. He does not check his blood sugars at home.  Lab Results  Component Value Date   HGBA1C 6.5 06/18/2017   OSA -was not able to tolerate CPAP in the past, tried three different masks in the past. He reports not sleeping well and feeling fatigued.   Essential hypertension-only prescribed Norvasc 10 mg, hydrochlorothiazide 12.5 mg, and Cozaar 25 mg daily. He does monitor his BP at home and reports readings in the 120's-130's/70-80's.  BP Readings from Last 3 Encounters:  02/08/18 138/80  01/14/18 114/74  12/10/17 118/80   Hyperlipidemia-takes Zocor 20 mg daily.  Denies myalgias Lab Results  Component Value Date   CHOL 127 06/12/2016   HDL 43.00 06/12/2016   LDLCALC 56 06/12/2016   LDLDIRECT 103.0 07/01/2014   TRIG 139.0 06/12/2016   CHOLHDL 3 06/12/2016    History of gout-allopurinol 300 mg daily.  Denies any recent gout flares  H/o Prostate Cancer -has undergone seed implant in June 2019. Is followed by [facility] every six months.. Currently prescribed Flomax 0.4 mg daily.   Health Maintenance: Dental -- Routine Care  Vision -- Routine - Dr. Gershon Crane  Immunizations -- UTD Colonoscopy -- UTD  Past Medical History:  Diagnosis Date  . Carotid artery calcification 03/2011   MILD HARD PLAQUE LEFT BULB, 0-39% BIL. ICA STENOSIS, PATENT VERTEBRAL ARTERIES WITH ANTEGRADE FLOW  . Diabetes type 2, controlled ([place])   . Diverticulitis   . Fatty liver   . GERD (gastroesophageal reflux disease)   . Gout   . History of kidney stones    Left kidney  . HTN (hypertension)   . Hyperlipidemia   . Malignant neoplasm of kidney excluding renal pelvis (HCC)    Right Kidney  . OA (osteoarthritis)    Knee  . OSA (obstructive sleep apnea) 04/18/2016   SEVERE; UNABLE TO WEAR CPAP MORE THAN 2-3 HOURS PER NIGHT  . Plantar fasciitis   . Prostate cancer ([place])   . Seasonal allergies     Past Surgical History:  Procedure Laterality Date  . COLON SURGERY  2011   Partial colon removed due to diverticulitis  . COLONOSCOPY  08/2009  . NEPHRECTOMY Right   . PROSTATE BIOPSY    . RADIOACTIVE SEED IMPLANT N/A 08/03/2017   Procedure: RADIOACTIVE SEED IMPLANT/BRACHYTHERAPY IMPLANT;  Surgeon: Kathie Rhodes, MD;  Location: [facility];  Service: Urology;  Laterality:  N/A;  . SPACE OAR INSTILLATION N/A 08/03/2017   Procedure: SPACE OAR INSTILLATION;  Surgeon: Kathie Rhodes, MD;  Location: [facility];  Service: Urology;  Laterality: N/A;    Current Outpatient Medications on File Prior to Visit  Medication Sig Dispense Refill  . allopurinol (ZYLOPRIM) 300 MG tablet TAKE 1 TABLET BY MOUTH ONCE DAILY 90 tablet 3  . amLODipine  (NORVASC) 10 MG tablet Take 1 tablet (10 mg total) by mouth daily. 90 tablet 0  . aspirin 81 MG chewable tablet Chew by mouth.    . cetirizine (ZYRTEC) 10 MG tablet Take 10 mg by mouth daily.      . diclofenac sodium (VOLTAREN) 1 % GEL apply 2-4 grams to large joint area up to four times a day as needed (Patient not taking: Reported on 02/08/2018) 1 Tube 5  . Docusate Calcium ([employer]S STOOL SOFTENER PO) Take by mouth daily.     . hydrochlorothiazide (HYDRODIURIL) 12.5 MG tablet TAKE ONE TABLET BY MOUTH ONCE DAILY 90 tablet 3  . losartan (COZAAR) 25 MG tablet TAKE 1 TABLET BY MOUTH ONCE DAILY 90 tablet 3  . metFORMIN (GLUCOPHAGE) 500 MG tablet TAKE 1 TABLET BY MOUTH BEFORE BREAKFAST 90 tablet 1  . Misc Natural Products (FIBER 7 PO) Take 2 tablets by mouth daily.     . Multiple Vitamins-Minerals (DAILY MULTI 50+ PO) Take 1 tablet by mouth daily.    Marland Kitchen omeprazole (PRILOSEC) 20 MG capsule Take 1 capsule (20 mg total) by mouth daily. (Patient not taking: Reported on 02/08/2018) 100 capsule 4  . simvastatin (ZOCOR) 20 MG tablet Take 20 mg by mouth daily.    . traMADol (ULTRAM) 50 MG tablet Take 1 tablet (50 mg total) by mouth every 6 (six) hours as needed. (Patient not taking: Reported on 02/08/2018) 30 tablet 0   No current facility-administered medications on file prior to visit.     Allergies  Allergen Reactions  . Oxycodone Other (See Comments)    Patient stated he has nightmares with oxycodone    Family History  Problem Relation Age of Onset  . Heart disease Mother        heart failure - 75 and 98 when she passed   . Stroke Mother   . Cancer Father        wagners  . Glaucoma Father        wagoner's glaucoma  . Hypertension Other        multiple family members    Social History   Socioeconomic History  . Marital status: Married    Spouse name: Not on file  . Number of children: Not on file  . Years of education: Not on file  . Highest education level: Not on file  Occupational History   . Occupation: retired    Comment: Physiological scientist  Social Needs  . Financial resource strain: Not on file  . Food insecurity:    Worry: Not on file    Inability: Not on file  . Transportation needs:    Medical: Not on file    Non-medical: Not on file  Tobacco Use  . Smoking status: Never Smoker  . Smokeless tobacco: Never Used  Substance and Sexual Activity  . Alcohol use: Yes    Alcohol/week: 1.0 standard drinks    Types: 1 Cans of beer per week    Comment: rare  . Drug use: No  . Sexual activity: Yes  Lifestyle  . Physical activity:    Days  per week: Not on file    Minutes per session: Not on file  . Stress: Not on file  Relationships  . Social connections:    Talks on phone: Not on file    Gets together: Not on file    Attends religious service: Not on file    Active member of club or organization: Not on file    Attends meetings of clubs or organizations: Not on file    Relationship status: Not on file  . Intimate partner violence:    Fear of current or ex partner: Not on file    Emotionally abused: Not on file    Physically abused: Not on file    Forced sexual activity: Not on file  Other Topics Concern  . Not on file  Social History Narrative  . Not on file    Review of Systems  Constitutional: Negative.   HENT: Negative.   Eyes: Negative.   Respiratory: Negative.   Cardiovascular: Negative.   Gastrointestinal: Negative.   Genitourinary: Negative.   Musculoskeletal: Negative.   Neurological: Negative.   Psychiatric/Behavioral: The patient has insomnia.   All other systems reviewed and are negative.   There were no vitals taken for this visit.  Physical Exam   VITALS per patient if applicable:  GENERAL: alert, oriented, appears well and in no acute distress  HEENT: atraumatic, conjunttiva clear, no obvious abnormalities on inspection of external nose and ears  NECK: normal movements of the head and neck  LUNGS: on inspection no signs of  respiratory distress, breathing rate appears normal, no obvious gross SOB, gasping or wheezing  CV: no obvious cyanosis  MS: moves all visible extremities without noticeable abnormality  PSYCH/NEURO: pleasant and cooperative, no obvious depression or anxiety, speech and thought processing grossly intact    No results found for this or any previous visit (from the past 2160 hour(s)).  Assessment/Plan:  1. Encounter to establish care - Follow up in May 2020 for CPE or sooner if needed   2. Diabetes 1.5, managed as type 2 ([place]) - Continue with Metformin and lifestyle modifications  - Will check A1c at CPE   3. Essential hypertension - Appears to be well controlled.  - amLODipine (NORVASC) 10 MG tablet; Take 1 tablet (10 mg total) by mouth daily.  Dispense: 90 tablet; Refill: 0  4. Hyperlipidemia, unspecified hyperlipidemia type - simvastatin (ZOCOR) 20 MG tablet; Take 1 tablet (20 mg total) by mouth daily.  Dispense: 90 tablet; Refill: 0  5. Malignant neoplasm of prostate ([place]) - Follow up with Urology as directed   6. OSA (obstructive sleep apnea) - Advised follow up with Pulmonary     I discussed the assessment and treatment plan with the patient. The patient was provided an opportunity to ask questions and all were answered. The patient agreed with the plan and demonstrated an understanding of the instructions.   The patient was advised to call back or seek an in-person evaluation if the symptoms worsen or if the condition fails to improve as anticipated.  I provided 32 minutes of non-face-to-face time during this encounter.   Dorothyann Peng, NP

## 2018-05-29 LAB — PSA: PSA: 0.119

## 2018-06-12 ENCOUNTER — Encounter: Payer: Self-pay | Admitting: Family Medicine

## 2018-06-24 ENCOUNTER — Other Ambulatory Visit: Payer: Self-pay

## 2018-06-24 NOTE — Patient Outreach (Signed)
[facility]) Care Management  06/24/2018  Jacob Stone 06-03-1950 [id number]   Medication Adherence call to Jacob Stone  HIPPA Compliant Voice message left with a call back number. Jacob Stone is showing past due on Metformin 500 mg and Simvastatin 20 mg under Jacob Stone.   [facility] Management Direct Dial [phone]  Fax [phone] [email or web address]

## 2018-07-03 ENCOUNTER — Telehealth: Payer: Self-pay | Admitting: Adult Health

## 2018-07-03 MED ORDER — LOSARTAN POTASSIUM 25 MG PO TABS: 25.0000 mg | ORAL_TABLET | Freq: Every day | ORAL | 0 refills | Status: DC

## 2018-07-03 MED ORDER — HYDROCHLOROTHIAZIDE 12.5 MG PO TABS: 12.5000 mg | ORAL_TABLET | Freq: Every day | ORAL | 0 refills | Status: DC

## 2018-07-03 MED ORDER — METFORMIN HCL 500 MG PO TABS: ORAL_TABLET | ORAL | 0 refills | Status: DC

## 2018-07-03 NOTE — Telephone Encounter (Signed)
Copied from [place] [id number]. Topic: Quick Communication - Rx Refill/Question >> Jul 03, 2018 11:31 AM Ahmed Prima L wrote: Medication: losartan (COZAAR) 25 MG tablet - hydrochlorothiazide (HYDRODIURIL) 12.5 MG tablet - metFORMIN (GLUCOPHAGE) 500 MG tablet (90 day supply on all of them , if possible)  Has the patient contacted their pharmacy?  Yes last week (Agent: If no, request that the patient contact the pharmacy for the refill.) (Agent: If yes, when and what did the pharmacy advise?)  Preferred Pharmacy (with phone number or street name): [facility], [address] AT [address] [place] [address] Phone: [phone] Fax: [phone]    Agent: Please be advised that RX refills may take up to 3 business days. We ask that you follow-up with your pharmacy.

## 2018-07-03 NOTE — Telephone Encounter (Signed)
We can do his CPE at anytime. Ok to refill medications until he is seen

## 2018-07-03 NOTE — Telephone Encounter (Signed)
Sent to the pharmacy by e-scribe for 90 days.  Pt now scheduled on 07/25/2018

## 2018-07-15 ENCOUNTER — Telehealth: Payer: Self-pay | Admitting: Adult Health

## 2018-07-15 NOTE — Telephone Encounter (Signed)
Copied from [place] [id number]. Topic: Quick Communication - Rx Refill/Question >> Jul 15, 2018  8:51 AM Erick Blinks wrote: Medication: allopurinol (ZYLOPRIM) 300 MG tablet [[id number]]   Has the patient contacted their pharmacy? Yes  (Agent: If no, request that the patient contact the pharmacy for the refill.) (Agent: If yes, when and what did the pharmacy advise?)  Preferred Pharmacy (with phone number or street name): [facility], [address] AT [address] [address] Phone: [phone] Fax: [phone]    Agent: Please be advised that RX refills may take up to 3 business days. We ask that you follow-up with your pharmacy.

## 2018-07-16 MED ORDER — ALLOPURINOL 300 MG PO TABS: 300.0000 mg | ORAL_TABLET | Freq: Every day | ORAL | 0 refills | Status: DC

## 2018-07-16 NOTE — Telephone Encounter (Signed)
Sent to the pharmacy by e-scribe for 90 days.  Pt has upcoming cpx on 07/25/2018.

## 2018-07-25 ENCOUNTER — Encounter: Payer: Self-pay | Admitting: Adult Health

## 2018-07-25 ENCOUNTER — Ambulatory Visit (INDEPENDENT_AMBULATORY_CARE_PROVIDER_SITE_OTHER): Payer: Medicare Other | Admitting: Adult Health

## 2018-07-25 ENCOUNTER — Other Ambulatory Visit: Payer: Self-pay

## 2018-07-25 VITALS — BP 134/80 | Temp 97.5°F | Ht 69.75 in | Wt 184.0 lb

## 2018-07-25 DIAGNOSIS — Z0000 Encounter for general adult medical examination without abnormal findings: Secondary | ICD-10-CM | POA: Diagnosis not present

## 2018-07-25 DIAGNOSIS — E785 Hyperlipidemia, unspecified: Secondary | ICD-10-CM | POA: Diagnosis not present

## 2018-07-25 DIAGNOSIS — B351 Tinea unguium: Secondary | ICD-10-CM

## 2018-07-25 DIAGNOSIS — Z0001 Encounter for general adult medical examination with abnormal findings: Secondary | ICD-10-CM

## 2018-07-25 DIAGNOSIS — I10 Essential (primary) hypertension: Secondary | ICD-10-CM

## 2018-07-25 DIAGNOSIS — E139 Other specified diabetes mellitus without complications: Secondary | ICD-10-CM

## 2018-07-25 DIAGNOSIS — G4733 Obstructive sleep apnea (adult) (pediatric): Secondary | ICD-10-CM

## 2018-07-25 DIAGNOSIS — C61 Malignant neoplasm of prostate: Secondary | ICD-10-CM

## 2018-07-25 DIAGNOSIS — Z1159 Encounter for screening for other viral diseases: Secondary | ICD-10-CM | POA: Diagnosis not present

## 2018-07-25 LAB — HEMOGLOBIN A1C: Hgb A1c MFr Bld: 6.4 % (ref 4.6–6.5)

## 2018-07-25 LAB — COMPREHENSIVE METABOLIC PANEL
ALT: 22 U/L (ref 0–53)
AST: 17 U/L (ref 0–37)
Albumin: 4.5 g/dL (ref 3.5–5.2)
Alkaline Phosphatase: 57 U/L (ref 39–117)
BUN: 22 mg/dL (ref 6–23)
CO2: 30 mEq/L (ref 19–32)
Calcium: 9.4 mg/dL (ref 8.4–10.5)
Chloride: 102 mEq/L (ref 96–112)
Creatinine, Ser: 1.27 mg/dL (ref 0.40–1.50)
GFR: 55.99 mL/min — ABNORMAL LOW (ref >60.00)
Glucose, Bld: 113 mg/dL — ABNORMAL HIGH (ref 70–99)
Potassium: 4.4 mEq/L (ref 3.5–5.1)
Sodium: 140 mEq/L (ref 135–145)
Total Bilirubin: 0.5 mg/dL (ref 0.2–1.2)
Total Protein: 6.8 g/dL (ref 6.0–8.3)

## 2018-07-25 LAB — CBC WITH DIFFERENTIAL/PLATELET
Basophils Absolute: 0 10*3/uL (ref 0.0–0.1)
Basophils Relative: 0.8 % (ref 0.0–3.0)
Eosinophils Absolute: 0.4 10*3/uL (ref 0.0–0.7)
Eosinophils Relative: 6 % — ABNORMAL HIGH (ref 0.0–5.0)
HCT: 46.7 % (ref 39.0–52.0)
Hemoglobin: 15.7 g/dL (ref 13.0–17.0)
Lymphocytes Relative: 24.2 % (ref 12.0–46.0)
Lymphs Abs: 1.5 10*3/uL (ref 0.7–4.0)
MCHC: 33.6 g/dL (ref 30.0–36.0)
MCV: 94.4 fl (ref 78.0–100.0)
Monocytes Absolute: 0.5 10*3/uL (ref 0.1–1.0)
Monocytes Relative: 8.5 % (ref 3.0–12.0)
Neutro Abs: 3.8 10*3/uL (ref 1.4–7.7)
Neutrophils Relative %: 60.5 % (ref 43.0–77.0)
Platelets: 251 10*3/uL (ref 150.0–400.0)
RBC: 4.95 Mil/uL (ref 4.22–5.81)
RDW: 13.3 % (ref 11.5–15.5)
WBC: 6.2 10*3/uL (ref 4.0–10.5)

## 2018-07-25 LAB — LIPID PANEL
Cholesterol: 114 mg/dL (ref 0–200)
HDL: 43.3 mg/dL (ref >39.00)
LDL Cholesterol: 31 mg/dL (ref 0–99)
NonHDL: 70.35
Total CHOL/HDL Ratio: 3
Triglycerides: 198 mg/dL — ABNORMAL HIGH (ref 0.0–149.0)
VLDL: 39.6 mg/dL (ref 0.0–40.0)

## 2018-07-25 LAB — TSH: TSH: 1.79 u[IU]/mL (ref 0.35–4.50)

## 2018-07-25 MED ORDER — TERBINAFINE HCL 250 MG PO TABS: 250.0000 mg | ORAL_TABLET | Freq: Every day | ORAL | 2 refills | Status: DC

## 2018-07-25 MED ORDER — AMLODIPINE BESYLATE 10 MG PO TABS: 10.0000 mg | ORAL_TABLET | Freq: Every day | ORAL | 3 refills | Status: DC

## 2018-07-25 MED ORDER — SIMVASTATIN 20 MG PO TABS: 20.0000 mg | ORAL_TABLET | Freq: Every day | ORAL | 3 refills | Status: DC

## 2018-07-25 NOTE — Progress Notes (Signed)
Subjective:    Patient ID: Jacob Stone., male    DOB: Jul 24, 1950, 68 y.o.   MRN: [id number]  HPI Patient presents for yearly preventative medicine examination. He is a pleasant 68 year old male who  has a past medical history of Carotid artery calcification (03/2011), Diabetes type 2, controlled ([place]), Diverticulitis, Fatty liver, FH: kidney cancer (2005), GERD (gastroesophageal reflux disease), Gout, History of kidney stones, HTN (hypertension), Hyperlipidemia, OA (osteoarthritis), OSA (obstructive sleep apnea) (04/18/2016), Plantar fasciitis, Prostate cancer ([place]), and Seasonal allergies.  DM II - Prescribed metformin 500 mg before breakfast.  He does not monitor his blood sugars at home.  Denies any GI issues with metformin or signs or symptoms of hypoglycemia Lab Results  Component Value Date   HGBA1C 6.5 06/18/2017   Essential Hypertension -currently prescribed Norvasc 10 mg, hydrochlorothiazide 12.5 mg, and Cozaar 25 mg daily.  Does monitor his blood pressure at home and reports readings in the 120s to 130s over 70s to 80s.  He denies chest pain, shortness of breath, dizziness, or lightheadedness. BP Readings from Last 3 Encounters:  07/25/18 134/80  02/08/18 138/80  01/14/18 114/74   Hyperlipidemia -Zocor 20 mg daily.  Denies myalgia Lab Results  Component Value Date   CHOL 127 06/12/2016   HDL 43.00 06/12/2016   LDLCALC 56 06/12/2016   LDLDIRECT 103.0 07/01/2014   TRIG 139.0 06/12/2016   CHOLHDL 3 06/12/2016   OSA -not able to tolerate CPAP in the past. He continues to feel fatigued   H/o gout -allopurinol 300 mg daily.  He denies any recent gout flares  H/o Prostate Cancer -underwent seed implant in June 2019.  Followed by [facility] 6 months   All immunizations and health maintenance protocols were reviewed with the patient and needed orders were placed. UTD  Appropriate screening laboratory values were ordered for the patient including screening of  hyperlipidemia, renal function and hepatic function. If indicated by BPH, a PSA was ordered.  Medication reconciliation,  past medical history, social history, problem list and allergies were reviewed in detail with the patient  Goals were established with regard to weight loss, exercise, and  diet in compliance with medications Wt Readings from Last 3 Encounters:  07/25/18 184 lb (83.5 kg)  01/14/18 185 lb (83.9 kg)  12/10/17 186 lb 5 oz (84.5 kg)   End of life planning was discussed.  He is up-to-date on routine dental and vision screens as well as colonoscopy  Review of Systems  Constitutional: Positive for fatigue.  HENT: Negative.   Eyes: Negative.   Respiratory: Negative.   Cardiovascular: Negative.   Gastrointestinal: Negative.   Endocrine: Negative.   Genitourinary: Negative.   Musculoskeletal: Negative.   Skin: Negative.   Allergic/Immunologic: Negative.   Neurological: Negative.   Hematological: Negative.   Psychiatric/Behavioral: Negative.   All other systems reviewed and are negative.  Past Medical History:  Diagnosis Date  . Carotid artery calcification 03/2011   MILD HARD PLAQUE LEFT BULB, 0-39% BIL. ICA STENOSIS, PATENT VERTEBRAL ARTERIES WITH ANTEGRADE FLOW  . Diabetes type 2, controlled ([place])   . Diverticulitis   . Fatty liver   . FH: kidney cancer 2005   right kidney removed   . GERD (gastroesophageal reflux disease)   . Gout   . History of kidney stones    Left kidney  . HTN (hypertension)   . Hyperlipidemia   . OA (osteoarthritis)    Knee  . OSA (obstructive sleep apnea) 04/18/2016  SEVERE; UNABLE TO WEAR CPAP MORE THAN 2-3 HOURS PER NIGHT  . Plantar fasciitis   . Prostate cancer ([place])   . Seasonal allergies     Social History   Socioeconomic History  . Marital status: Married    Spouse name: Not on file  . Number of children: Not on file  . Years of education: Not on file  . Highest education level: Not on file  Occupational History   . Occupation: retired    Comment: Physiological scientist  Social Needs  . Financial resource strain: Not on file  . Food insecurity    Worry: Not on file    Inability: Not on file  . Transportation needs    Medical: Not on file    Non-medical: Not on file  Tobacco Use  . Smoking status: Never Smoker  . Smokeless tobacco: Never Used  Substance and Sexual Activity  . Alcohol use: Yes    Alcohol/week: 1.0 standard drinks    Types: 1 Cans of beer per week    Comment: rare  . Drug use: No  . Sexual activity: Yes  Lifestyle  . Physical activity    Days per week: Not on file    Minutes per session: Not on file  . Stress: Not on file  Relationships  . Social Herbalist on phone: Not on file    Gets together: Not on file    Attends religious service: Not on file    Active member of club or organization: Not on file    Attends meetings of clubs or organizations: Not on file    Relationship status: Not on file  . Intimate partner violence    Fear of current or ex partner: Not on file    Emotionally abused: Not on file    Physically abused: Not on file    Forced sexual activity: Not on file  Other Topics Concern  . Not on file  Social History Narrative  . Not on file    Past Surgical History:  Procedure Laterality Date  . COLON SURGERY  2011   Partial colon removed due to diverticulitis  . COLONOSCOPY  08/2009  . NEPHRECTOMY Right   . PROSTATE BIOPSY    . RADIOACTIVE SEED IMPLANT N/A 08/03/2017   Procedure: RADIOACTIVE SEED IMPLANT/BRACHYTHERAPY IMPLANT;  Surgeon: Kathie Rhodes, MD;  Location: [facility];  Service: Urology;  Laterality: N/A;  . SPACE OAR INSTILLATION N/A 08/03/2017   Procedure: SPACE OAR INSTILLATION;  Surgeon: Kathie Rhodes, MD;  Location: [facility];  Service: Urology;  Laterality: N/A;    Family History  Problem Relation Age of Onset  . Heart disease Mother        heart failure - 20 and 2 when she passed   .  Stroke Mother   . Cancer Father        wagners  . Glaucoma Father        wagoner's glaucoma  . Hypertension Other        multiple family members    Allergies  Allergen Reactions  . Oxycodone Other (See Comments)    Patient stated he has nightmares with oxycodone    Current Outpatient Medications on File Prior to Visit  Medication Sig Dispense Refill  . allopurinol (ZYLOPRIM) 300 MG tablet Take 1 tablet (300 mg total) by mouth daily. 90 tablet 0  . aspirin 81 MG chewable tablet Chew by mouth.    . cetirizine (ZYRTEC) 10 MG  tablet Take 10 mg by mouth daily.      . Docusate Calcium ([employer] STOOL SOFTENER PO) Take by mouth daily.     . hydrochlorothiazide (HYDRODIURIL) 12.5 MG tablet Take 1 tablet (12.5 mg total) by mouth daily. 90 tablet 0  . losartan (COZAAR) 25 MG tablet Take 1 tablet (25 mg total) by mouth daily. 90 tablet 0  . metFORMIN (GLUCOPHAGE) 500 MG tablet TAKE 1 TABLET BY MOUTH BEFORE BREAKFAST 90 tablet 0  . Misc Natural Products (FIBER 7 PO) Take 2 tablets by mouth daily.     . Multiple Vitamins-Minerals (DAILY MULTI 50+ PO) Take 1 tablet by mouth daily.    . tamsulosin (FLOMAX) 0.4 MG CAPS capsule Take 0.4 mg by mouth daily.     No current facility-administered medications on file prior to visit.     BP 134/80   Temp (!) 97.5 F (36.4 C) (Oral)   Ht 5' 9.75" (1.772 m) Comment: WITH SHOES  Wt 184 lb (83.5 kg)   BMI 26.59 kg/m       Objective:   Physical Exam Vitals signs and nursing note reviewed.  Constitutional:      General: He is not in acute distress.    Appearance: Normal appearance. He is well-developed. He is not diaphoretic.  HENT:     Head: Normocephalic and atraumatic.     Right Ear: Tympanic membrane, ear canal and external ear normal.     Left Ear: Tympanic membrane, ear canal and external ear normal.     Nose: Nose normal. No congestion or rhinorrhea.     Mouth/Throat:     Mouth: Mucous membranes are moist.     Pharynx: Oropharynx is clear.  No oropharyngeal exudate or posterior oropharyngeal erythema.  Eyes:     General: No scleral icterus.       Right eye: No discharge.        Left eye: No discharge.     Conjunctiva/sclera: Conjunctivae normal.     Pupils: Pupils are equal, round, and reactive to light.  Neck:     Musculoskeletal: Normal range of motion and neck supple.     Thyroid: No thyromegaly.     Trachea: No tracheal deviation.  Cardiovascular:     Rate and Rhythm: Normal rate and regular rhythm.     Heart sounds: Normal heart sounds. No murmur. No friction rub. No gallop.   Pulmonary:     Effort: Pulmonary effort is normal. No respiratory distress.     Breath sounds: Normal breath sounds. No stridor. No wheezing, rhonchi or rales.  Chest:     Chest wall: No tenderness.  Abdominal:     General: Bowel sounds are normal. There is no distension.     Palpations: Abdomen is soft. There is no mass.     Tenderness: There is no abdominal tenderness. There is no right CVA tenderness, left CVA tenderness, guarding or rebound.     Hernia: No hernia is present.  Musculoskeletal: Normal range of motion.        General: No swelling, tenderness, deformity or signs of injury.     Right lower leg: No edema.     Left lower leg: No edema.  Lymphadenopathy:     Cervical: No cervical adenopathy.  Skin:    General: Skin is warm and dry.     Capillary Refill: Capillary refill takes less than 2 seconds.     Coloration: Skin is not jaundiced or pale.     Findings: No  bruising, erythema, lesion or rash.     Comments: Toenail fungus on all toes bilaterally.   Neurological:     General: No focal deficit present.     Mental Status: He is alert and oriented to person, place, and time. Mental status is at baseline.     Cranial Nerves: No cranial nerve deficit.     Sensory: No sensory deficit.     Motor: No weakness.     Coordination: Coordination normal.     Gait: Gait normal.     Deep Tendon Reflexes: Reflexes normal.   Psychiatric:        Mood and Affect: Mood normal.        Behavior: Behavior normal.        Thought Content: Thought content normal.        Judgment: Judgment normal.        Assessment & Plan:  1. Routine general medical examination at a health care facility  - CBC with Differential/Platelet - Comprehensive metabolic panel - Hemoglobin A1c - Lipid panel - TSH  2. Diabetes 1.5, managed as type 2 ([place]) - Consider additional therapy  - Follow up in 3-6 months  - CBC with Differential/Platelet - Comprehensive metabolic panel - Hemoglobin A1c - Lipid panel - TSH  3. OSA (obstructive sleep apnea) Haywood Pao him trial wearing Cpap mask for short periods of time while awake or napping   4. Hyperlipidemia, unspecified hyperlipidemia type - Consider change in zocor dose  - CBC with Differential/Platelet - Comprehensive metabolic panel - Hemoglobin A1c - Lipid panel - TSH - simvastatin (ZOCOR) 20 MG tablet; Take 1 tablet (20 mg total) by mouth daily.  Dispense: 90 tablet; Refill: 3  5. Essential hypertension - No change in medications  - CBC with Differential/Platelet - Comprehensive metabolic panel - Hemoglobin A1c - Lipid panel - TSH - amLODipine (NORVASC) 10 MG tablet; Take 1 tablet (10 mg total) by mouth daily.  Dispense: 90 tablet; Refill: 3  6. Malignant neoplasm of prostate ([place]) - Follow up with Urology every 6 months   7. Need for hepatitis C screening test  - Hep C Antibody  8. Toenail fungus  - terbinafine (LAMISIL) 250 MG tablet; Take 1 tablet (250 mg total) by mouth daily.  Dispense: 30 tablet; Refill: 2  Dorothyann Peng, NP

## 2018-07-26 LAB — HEPATITIS C ANTIBODY
Hepatitis C Ab: NONREACTIVE
SIGNAL TO CUT-OFF: 0.04 (ref <1.00)

## 2018-08-15 ENCOUNTER — Encounter: Payer: Self-pay | Admitting: Family Medicine

## 2018-09-29 ENCOUNTER — Other Ambulatory Visit: Payer: Self-pay | Admitting: Adult Health

## 2018-10-01 NOTE — Telephone Encounter (Signed)
Sent to the pharmacy by e-scribe. 

## 2018-10-11 ENCOUNTER — Other Ambulatory Visit: Payer: Self-pay | Admitting: Adult Health

## 2018-10-15 NOTE — Telephone Encounter (Signed)
Sent to the pharmacy by e-scribe. 

## 2018-10-20 ENCOUNTER — Other Ambulatory Visit: Payer: Self-pay | Admitting: Adult Health

## 2018-10-20 DIAGNOSIS — B351 Tinea unguium: Secondary | ICD-10-CM

## 2018-10-22 NOTE — Telephone Encounter (Signed)
He should have completed his three month of therapy and this is all he needs

## 2018-10-28 ENCOUNTER — Other Ambulatory Visit: Payer: Self-pay | Admitting: Adult Health

## 2018-10-28 DIAGNOSIS — B351 Tinea unguium: Secondary | ICD-10-CM

## 2018-10-28 NOTE — Telephone Encounter (Signed)
Pt would like to know if he should continue medication? If so, pt would like a refill.   Pharmacy:  [facility] [place], [place] [address] [phone] (Phone) [phone] (Fax)

## 2018-11-06 ENCOUNTER — Other Ambulatory Visit: Payer: Self-pay

## 2018-11-06 ENCOUNTER — Ambulatory Visit (INDEPENDENT_AMBULATORY_CARE_PROVIDER_SITE_OTHER): Payer: Medicare Other

## 2018-11-06 DIAGNOSIS — Z23 Encounter for immunization: Secondary | ICD-10-CM | POA: Diagnosis not present

## 2018-11-28 LAB — HEMOGLOBIN A1C: Hemoglobin A1C: 6.4

## 2018-12-03 ENCOUNTER — Telehealth: Payer: Self-pay | Admitting: Family Medicine

## 2018-12-03 NOTE — Telephone Encounter (Signed)
Copied from [place] [id number]. Topic: General - Other >> Dec 03, 2018 12:17 PM Keene Breath wrote: Reason for CRM: Patient has arthritis in hip and knees.  He would like to speak with the nurse to see whether he should see the doctor or get a referral.  Please advise and call patient to discuss.  CB# [phone]

## 2018-12-04 NOTE — Telephone Encounter (Signed)
Left a message for a return call.

## 2018-12-04 NOTE — Telephone Encounter (Signed)
Spoke to the pt.  He states he is having pain in both legs from his hips down.  He will call Dr. Sherlon Handing office to discuss further.  Less than 1 year since last seen.  Also mentioned that he is having a sharp pain at his left temple that lasts for a few seconds and then goes away but then left with blurred vision.  Pt stated there seems to be no "rhyme or reason" to when it happens.  Advised a visit with Tommi Rumps.  Pt scheduled for 12/05/2018 for telephone visit.  Will forward to [facility].

## 2018-12-05 ENCOUNTER — Telehealth (INDEPENDENT_AMBULATORY_CARE_PROVIDER_SITE_OTHER): Payer: Medicare Other | Admitting: Adult Health

## 2018-12-05 ENCOUNTER — Other Ambulatory Visit: Payer: Self-pay

## 2018-12-05 DIAGNOSIS — H5712 Ocular pain, left eye: Secondary | ICD-10-CM | POA: Diagnosis not present

## 2018-12-05 NOTE — Progress Notes (Signed)
Virtual Visit via Telephone Note  I connected with [name]. on 12/05/18 at  2:00 PM EDT by telephone and verified that I am speaking with the correct person using two identifiers.   I discussed the limitations, risks, security and privacy concerns of performing an evaluation and management service by telephone and the availability of in person appointments. I also discussed with the patient that there may be a patient responsible charge related to this service. The patient expressed understanding and agreed to proceed.  Location patient: home Location provider: work or home office Participants present for the call: patient, provider Patient did not have a visit in the prior 7 days to address this/these issue(s).   History of Present Illness: 68 year old male who  has a past medical history of Carotid artery calcification (03/2011), Diabetes type 2, controlled ([place]), Diverticulitis, Fatty liver, FH: kidney cancer (2005), GERD (gastroesophageal reflux disease), Gout, History of kidney stones, HTN (hypertension), Hyperlipidemia, OA (osteoarthritis), OSA (obstructive sleep apnea) (04/18/2016), Plantar fasciitis, Prostate cancer ([place]), and Seasonal allergies.  Is being evaluated today for an acute issue.  His symptoms started approximately 2 weeks ago although it is intermittent he reports that symptoms have been more frequent.  Symptoms include feeling of pressure behind his left eye with associated decreased distance vision as well as double vision.  He has no pain with eye movement, denies trauma or injury.  Denies pain with brushing or shampooing hair.  Pain does not radiate  He does see Dr. Gershon Crane on a yearly basis, last visit was approximately 6 months to a year ago.   Observations/Objective: Patient sounds cheerful and well on the phone. I do not appreciate any SOB. Speech and thought processing are grossly intact. Patient reported vitals:  Assessment and Plan: 1. Eye pain,  left -Does not appear to be sinus related and no concern for temporal arteritis, highly doubt glaucoma.  There is some concern for optic neuritis.  I will have him follow-up with Dr. Gershon Crane as soon as possible.   Follow Up Instructions:  I did not refer this patient for an OV in the next 24 hours for this/these issue(s).  I discussed the assessment and treatment plan with the patient. The patient was provided an opportunity to ask questions and all were answered. The patient agreed with the plan and demonstrated an understanding of the instructions.   The patient was advised to call back or seek an in-person evaluation if the symptoms worsen or if the condition fails to improve as anticipated.  I provided 15 minutes of non-face-to-face time during this encounter.   Dorothyann Peng, NP

## 2018-12-30 ENCOUNTER — Telehealth: Payer: Self-pay | Admitting: *Deleted

## 2018-12-30 NOTE — Telephone Encounter (Signed)
Pt is scheduled to see Tommi Rumps on 12/31/2018.  Nothing further needed.

## 2018-12-30 NOTE — Telephone Encounter (Signed)
Patient called after hours line. Patient reports  he has a cough, scratchy throat, and congestion. Caller does typically have these symptoms this time of year but would like to know if he needs to get tested.

## 2018-12-30 NOTE — Telephone Encounter (Signed)
Left a message for a return call.

## 2018-12-31 ENCOUNTER — Other Ambulatory Visit: Payer: Self-pay

## 2018-12-31 ENCOUNTER — Telehealth (INDEPENDENT_AMBULATORY_CARE_PROVIDER_SITE_OTHER): Payer: Medicare Other | Admitting: Adult Health

## 2018-12-31 DIAGNOSIS — J069 Acute upper respiratory infection, unspecified: Secondary | ICD-10-CM | POA: Diagnosis not present

## 2018-12-31 NOTE — Progress Notes (Signed)
Virtual Visit via Video Note  I connected with Jacob Stone  on 12/31/18 at  3:00 PM EST by a video enabled telemedicine application and verified that I am speaking with the correct person using two identifiers.  Location patient: home Location provider:work or home office Persons participating in the virtual visit: patient, provider  I discussed the limitations of evaluation and management by telemedicine and the availability of in person appointments. The patient expressed understanding and agreed to proceed.   HPI: 68 year old male who is being evaluated today for possible URI.  He usually gets a viral URI once or twice a year at approximately the same time.  His symptoms started less than a week ago,  which included a dry cough, some mild chest congestion, and is scratchy cough.  Over the last 2 days his symptoms have improved.  He denies fever, chills, shortness of breath, chest pain, ear pain. He has been using various over-the-counter medications   ROS: See pertinent positives and negatives per HPI.  Past Medical History:  Diagnosis Date  . Carotid artery calcification 03/2011   MILD HARD PLAQUE LEFT BULB, 0-39% BIL. ICA STENOSIS, PATENT VERTEBRAL ARTERIES WITH ANTEGRADE FLOW  . Diabetes type 2, controlled ([place])   . Diverticulitis   . Fatty liver   . FH: kidney cancer 2005   right kidney removed   . GERD (gastroesophageal reflux disease)   . Gout   . History of kidney stones    Left kidney  . HTN (hypertension)   . Hyperlipidemia   . OA (osteoarthritis)    Knee  . OSA (obstructive sleep apnea) 04/18/2016   SEVERE; UNABLE TO WEAR CPAP MORE THAN 2-3 HOURS PER NIGHT  . Plantar fasciitis   . Prostate cancer ([place])   . Seasonal allergies     Past Surgical History:  Procedure Laterality Date  . COLON SURGERY  2011   Partial colon removed due to diverticulitis  . COLONOSCOPY  08/2009  . NEPHRECTOMY Right   . PROSTATE BIOPSY    . RADIOACTIVE SEED IMPLANT N/A 08/03/2017   Procedure: RADIOACTIVE SEED IMPLANT/BRACHYTHERAPY IMPLANT;  Surgeon: Kathie Rhodes, MD;  Location: [facility];  Service: Urology;  Laterality: N/A;  . SPACE OAR INSTILLATION N/A 08/03/2017   Procedure: SPACE OAR INSTILLATION;  Surgeon: Kathie Rhodes, MD;  Location: [facility];  Service: Urology;  Laterality: N/A;    Family History  Problem Relation Age of Onset  . Heart disease Mother        heart failure - 43 and 62 when she passed   . Stroke Mother   . Cancer Father        wagners  . Glaucoma Father        wagoner's glaucoma  . Hypertension Other        multiple family members     Current Outpatient Medications:  .  allopurinol (ZYLOPRIM) 300 MG tablet, TAKE 1 TABLET(300 MG) BY MOUTH DAILY, Disp: 90 tablet, Rfl: 2 .  amLODipine (NORVASC) 10 MG tablet, Take 1 tablet (10 mg total) by mouth daily., Disp: 90 tablet, Rfl: 3 .  aspirin 81 MG chewable tablet, Chew by mouth., Disp: , Rfl:  .  cetirizine (ZYRTEC) 10 MG tablet, Take 10 mg by mouth daily.  , Disp: , Rfl:  .  Docusate Calcium ([employer]S STOOL SOFTENER PO), Take by mouth daily. , Disp: , Rfl:  .  hydrochlorothiazide (HYDRODIURIL) 12.5 MG tablet, TAKE 1 TABLET(12.5 MG) BY MOUTH DAILY, Disp: 90 tablet,  Rfl: 3 .  losartan (COZAAR) 25 MG tablet, TAKE 1 TABLET(25 MG) BY MOUTH DAILY, Disp: 90 tablet, Rfl: 3 .  metFORMIN (GLUCOPHAGE) 500 MG tablet, TAKE 1 TABLET BY MOUTH BEFORE BREAKFAST, Disp: 90 tablet, Rfl: 0 .  Misc Natural Products (FIBER 7 PO), Take 2 tablets by mouth daily. , Disp: , Rfl:  .  Multiple Vitamins-Minerals (DAILY MULTI 50+ PO), Take 1 tablet by mouth daily., Disp: , Rfl:  .  simvastatin (ZOCOR) 20 MG tablet, Take 1 tablet (20 mg total) by mouth daily., Disp: 90 tablet, Rfl: 3 .  tamsulosin (FLOMAX) 0.4 MG CAPS capsule, Take 0.4 mg by mouth daily., Disp: , Rfl:  .  terbinafine (LAMISIL) 250 MG tablet, Take 1 tablet (250 mg total) by mouth daily., Disp: 30 tablet, Rfl: 2  EXAM:  VITALS per  patient if applicable:  GENERAL: alert, oriented, appears well and in no acute distress  HEENT: atraumatic, conjunttiva clear, no obvious abnormalities on inspection of external nose and ears  NECK: normal movements of the head and neck  LUNGS: on inspection no signs of respiratory distress, breathing rate appears normal, no obvious gross SOB, gasping or wheezing  CV: no obvious cyanosis  MS: moves all visible extremities without noticeable abnormality  PSYCH/NEURO: pleasant and cooperative, no obvious depression or anxiety, speech and thought processing grossly intact  ASSESSMENT AND PLAN:  Discussed the following assessment and plan:  1. Viral URI with cough -Since he is improving I advised conservative measures.  Follow-up if symptoms do not resolve within the next 2 days or sooner if fever develops.    I discussed the assessment and treatment plan with the patient. The patient was provided an opportunity to ask questions and all were answered. The patient agreed with the plan and demonstrated an understanding of the instructions.   The patient was advised to call back or seek an in-person evaluation if the symptoms worsen or if the condition fails to improve as anticipated.   Dorothyann Peng, NP

## 2019-01-08 ENCOUNTER — Other Ambulatory Visit: Payer: Self-pay | Admitting: Family Medicine

## 2019-01-08 DIAGNOSIS — E139 Other specified diabetes mellitus without complications: Secondary | ICD-10-CM

## 2019-01-08 MED ORDER — METFORMIN HCL 500 MG PO TABS: ORAL_TABLET | ORAL | 0 refills | Status: DC

## 2019-01-08 NOTE — Telephone Encounter (Signed)
Sent to the pharmacy by e-scribe for 90 days.  Pt has upcoming A1C follow up.

## 2019-01-15 ENCOUNTER — Encounter: Payer: Self-pay | Admitting: Family Medicine

## 2019-01-22 ENCOUNTER — Other Ambulatory Visit: Payer: Medicare Other

## 2019-01-23 ENCOUNTER — Telehealth: Payer: Medicare Other | Admitting: Adult Health

## 2019-03-18 ENCOUNTER — Ambulatory Visit (INDEPENDENT_AMBULATORY_CARE_PROVIDER_SITE_OTHER): Payer: Medicare Other

## 2019-03-18 VITALS — BP 131/78 | Ht 69.0 in | Wt 181.0 lb

## 2019-03-18 DIAGNOSIS — Z1211 Encounter for screening for malignant neoplasm of colon: Secondary | ICD-10-CM | POA: Diagnosis not present

## 2019-03-18 DIAGNOSIS — Z0000 Encounter for general adult medical examination without abnormal findings: Secondary | ICD-10-CM | POA: Diagnosis not present

## 2019-03-18 NOTE — Patient Instructions (Addendum)
Jacob Stone , Thank you for taking time to participate in your Medicare Wellness Visit. I appreciate your ongoing commitment to your health goals. Please review the following plan we discussed and let me know if I can assist you in the future.   Screening recommendations/referrals: Colorectal Screening: colonoscopy completed 07/21/2009; due again 07/23/2019. Referral sent to [facility] endoscopy per patient request.  Vision and Dental Exams: Recommended annual ophthalmology exams for early detection of glaucoma and other disorders of the eye. Patient is scheduled for routine eye visit in March 2021. Recommended annual dental exams for proper oral hygiene. Patient is seeing his dentist regularly.  Diabetic Exams: Diabetic Eye Exam: last visit 04/22/2018. Next appointment scheduled in March 2021. Diabetic Foot Exam: completed 07/25/2018. Due again 07/25/2019.  Vaccinations: Influenza vaccine: completed 11/06/2018. Due again in Fall 2021. Pneumococcal vaccine: completed 04/06/2014 & 06/18/2017. Up to date. Tdap vaccine:completed 04/06/2014. Due again 04/05/2024. Shingles vaccine: Please call your insurance company or pharmacy to determine your out of pocket expense for the Shingrix vaccine. You may receive this vaccine at your local pharmacy.  Advanced directives: Advance directives discussed with you today. Advance directives discussed with you today. You have declined to receive documents for completion. Once you complete these with your attorney, please bring a copy of your POA (Power of [place]) and/or Living Will to your next appointment.  Goals: Recommend to drink at least 6-8 8oz glasses of water per day.  Continue to exercise for at least 150 minutes per week.  Recommend to remove any items from the home that may cause slips or trips.  Recommend to decrease portion sizes by eating 3 small healthy meals and at least 2 healthy snacks per day.  Next appointment: Please schedule your Annual  Wellness Visit with your Nurse Health Advisor in one year.  Preventive Care 71 Years and Older, Male Preventive care refers to lifestyle choices and visits with your health care provider that can promote health and wellness. What does preventive care include?  A yearly physical exam. This is also called an annual well check.  Dental exams once or twice a year.  Routine eye exams. Ask your health care provider how often you should have your eyes checked.  Personal lifestyle choices, including:  Daily care of your teeth and gums.  Regular physical activity.  Eating a healthy diet.  Avoiding tobacco and drug use.  Limiting alcohol use.  Practicing safe sex.  Taking low doses of aspirin every day if recommended by your health care provider..  Taking vitamin and mineral supplements as recommended by your health care provider. What happens during an annual well check? The services and screenings done by your health care provider during your annual well check will depend on your age, overall health, lifestyle risk factors, and family history of disease. Counseling  Your health care provider may ask you questions about your:  Alcohol use.  Tobacco use.  Drug use.  Emotional well-being.  Home and relationship well-being.  Sexual activity.  Eating habits.  History of falls.  Memory and ability to understand (cognition).  Work and work Statistician. Screening  You may have the following tests or measurements:  Height, weight, and BMI.  Blood pressure.  Lipid and cholesterol levels. These may be checked every 5 years, or more frequently if you are over 34 years old.  Skin check.  Lung cancer screening. You may have this screening every year starting at age 71 if you have a 30-pack-year history of smoking and currently  smoke or have quit within the past 15 years.  Fecal occult blood test (FOBT) of the stool. You may have this test every year starting at age  71.  Flexible sigmoidoscopy or colonoscopy. You may have a sigmoidoscopy every 5 years or a colonoscopy every 10 years starting at age 71.  Prostate cancer screening. Recommendations will vary depending on your family history and other risks.  Hepatitis C blood test.  Hepatitis B blood test.  Sexually transmitted disease (STD) testing.  Diabetes screening. This is done by checking your blood sugar (glucose) after you have not eaten for a while (fasting). You may have this done every 1-3 years.  Abdominal aortic aneurysm (AAA) screening. You may need this if you are a current or former smoker.  Osteoporosis. You may be screened starting at age 71 if you are at high risk. Talk with your health care provider about your test results, treatment options, and if necessary, the need for more tests. Vaccines  Your health care provider may recommend certain vaccines, such as:  Influenza vaccine. This is recommended every year.  Tetanus, diphtheria, and acellular pertussis (Tdap, Td) vaccine. You may need a Td booster every 10 years.  Zoster vaccine. You may need this after age 71.  Pneumococcal 13-valent conjugate (PCV13) vaccine. One dose is recommended after age 71.  Pneumococcal polysaccharide (PPSV23) vaccine. One dose is recommended after age 71. Talk to your health care provider about which screenings and vaccines you need and how often you need them. This information is not intended to replace advice given to you by your health care provider. Make sure you discuss any questions you have with your health care provider. Document Released: 02/19/2015 Document Revised: 10/13/2015 Document Reviewed: 11/24/2014 Elsevier Interactive Patient Education  2017 [employer] Prevention in the Home Falls can cause injuries. They can happen to people of all ages. There are many things you can do to make your home safe and to help prevent falls. What can I do on the outside of my  home?  Regularly fix the edges of walkways and driveways and fix any cracks.  Remove anything that might make you trip as you walk through a door, such as a raised step or threshold.  Trim any bushes or trees on the path to your home.  Use bright outdoor lighting.  Clear any walking paths of anything that might make someone trip, such as rocks or tools.  Regularly check to see if handrails are loose or broken. Make sure that both sides of any steps have handrails.  Any raised decks and porches should have guardrails on the edges.  Have any leaves, snow, or ice cleared regularly.  Use sand or salt on walking paths during winter.  Clean up any spills in your garage right away. This includes oil or grease spills. What can I do in the bathroom?  Use night lights.  Install grab bars by the toilet and in the tub and shower. Do not use towel bars as grab bars.  Use non-skid mats or decals in the tub or shower.  If you need to sit down in the shower, use a plastic, non-slip stool.  Keep the floor dry. Clean up any water that spills on the floor as soon as it happens.  Remove soap buildup in the tub or shower regularly.  Attach bath mats securely with double-sided non-slip rug tape.  Do not have throw rugs and other things on the floor that can make you  trip. What can I do in the bedroom?  Use night lights.  Make sure that you have a light by your bed that is easy to reach.  Do not use any sheets or blankets that are too big for your bed. They should not hang down onto the floor.  Have a firm chair that has side arms. You can use this for support while you get dressed.  Do not have throw rugs and other things on the floor that can make you trip. What can I do in the kitchen?  Clean up any spills right away.  Avoid walking on wet floors.  Keep items that you use a lot in easy-to-reach places.  If you need to reach something above you, use a strong step stool that has a  grab bar.  Keep electrical cords out of the way.  Do not use floor polish or wax that makes floors slippery. If you must use wax, use non-skid floor wax.  Do not have throw rugs and other things on the floor that can make you trip. What can I do with my stairs?  Do not leave any items on the stairs.  Make sure that there are handrails on both sides of the stairs and use them. Fix handrails that are broken or loose. Make sure that handrails are as long as the stairways.  Check any carpeting to make sure that it is firmly attached to the stairs. Fix any carpet that is loose or worn.  Avoid having throw rugs at the top or bottom of the stairs. If you do have throw rugs, attach them to the floor with carpet tape.  Make sure that you have a light switch at the top of the stairs and the bottom of the stairs. If you do not have them, ask someone to add them for you. What else can I do to help prevent falls?  Wear shoes that:  Do not have high heels.  Have rubber bottoms.  Are comfortable and fit you well.  Are closed at the toe. Do not wear sandals.  If you use a stepladder:  Make sure that it is fully opened. Do not climb a closed stepladder.  Make sure that both sides of the stepladder are locked into place.  Ask someone to hold it for you, if possible.  Clearly mark and make sure that you can see:  Any grab bars or handrails.  First and last steps.  Where the edge of each step is.  Use tools that help you move around (mobility aids) if they are needed. These include:  Canes.  Walkers.  Scooters.  Crutches.  Turn on the lights when you go into a dark area. Replace any light bulbs as soon as they burn out.  Set up your furniture so you have a clear path. Avoid moving your furniture around.  If any of your floors are uneven, fix them.  If there are any pets around you, be aware of where they are.  Review your medicines with your doctor. Some medicines can make  you feel dizzy. This can increase your chance of falling. Ask your doctor what other things that you can do to help prevent falls. This information is not intended to replace advice given to you by your health care provider. Make sure you discuss any questions you have with your health care provider. Document Released: 11/19/2008 Document Revised: 07/01/2015 Document Reviewed: 02/27/2014 Elsevier Interactive Patient Education  2017 [employer].

## 2019-03-18 NOTE — Progress Notes (Signed)
This visit is being conducted via phone call due to the COVID-19 pandemic. This patient has given me verbal consent via phone to conduct this visit, patient states they are participating from their home address. Some vital signs may be absent or patient reported.   Patient identification: identified by name, DOB, and current address.  Location provider: [facility], Office Persons participating in the virtual visit: Jacob Stone and Jacob Forts, LPN.   Subjective:   Jacob Stone. is a 69 y.o. male who presents for Medicare Annual/Subsequent preventive examination.  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 He does not do physical exercise outside of that. He reports eating 1 small meal at lunch and a larger meal at dinner. He is scheduled to receive covid vaccine #1 on 03/22/19.  Review of Systems:  No ROS: Annual Medicare Wellness visit by telephone today  Cardiac Risk Factors include: advanced age (>62men, >26 women);diabetes mellitus;dyslipidemia;hypertension;male gender     Objective:    Vitals: BP 131/78   Ht 5\' 9"  (1.753 m)   Wt 181 lb (82.1 kg)   BMI 26.73 kg/m   Body mass index is 26.73 kg/m.  Advanced Directives 03/18/2019 12/10/2017 08/03/2017  Does Patient Have a Medical Advance Directive? No Yes No  Would patient like information on creating a medical advance directive? No - Patient declined - Yes (MAU/Ambulatory/Procedural Areas - Information given)    Tobacco Social History   Tobacco Use  Smoking Status Never Smoker  Smokeless Tobacco Never Used     Counseling given: Not Answered   Clinical Intake:  Pre-visit preparation completed: Yes  Pain : No/denies pain     BMI - recorded: 26.73 Nutritional Status: BMI 25 -29 Overweight Nutritional Risks: None Diabetes: Yes CBG done?: No Did pt. bring in CBG monitor from home?: (does not check blood sugars at home)  How often do you need  to have someone help you when you read instructions, pamphlets, or other written materials from your doctor or pharmacy?: 1 - Never  Interpreter Needed?: No  Information entered by :: Jacob Forts, LPN.  Past Medical History:  Diagnosis Date  . Carotid artery calcification 03/2011   MILD HARD PLAQUE LEFT BULB, 0-39% BIL. ICA STENOSIS, PATENT VERTEBRAL ARTERIES WITH ANTEGRADE FLOW  . Diabetes type 2, controlled ([place])   . Diverticulitis   . Fatty liver   . FH: kidney cancer 2005   right kidney removed   . GERD (gastroesophageal reflux disease)   . Gout   . History of kidney stones    Left kidney  . HTN (hypertension)   . Hyperlipidemia   . OA (osteoarthritis)    Knee  . OSA (obstructive sleep apnea) 04/18/2016   SEVERE; UNABLE TO WEAR CPAP MORE THAN 2-3 HOURS PER NIGHT  . Plantar fasciitis   . Prostate cancer ([place])   . Seasonal allergies    Past Surgical History:  Procedure Laterality Date  . COLON SURGERY  2011   Partial colon removed due to diverticulitis  . COLONOSCOPY  08/2009  . NEPHRECTOMY Right   . PROSTATE BIOPSY    . RADIOACTIVE SEED IMPLANT N/A 08/03/2017   Procedure: RADIOACTIVE SEED IMPLANT/BRACHYTHERAPY IMPLANT;  Surgeon: Kathie Rhodes, MD;  [facility];  Service: Urology;  Laterality: N/A;  . SPACE OAR INSTILLATION N/A 08/03/2017   Procedure: SPACE OAR INSTILLATION;  Surgeon: Kathie Rhodes, MD;  Location: [facility];  Service:  Urology;  Laterality: N/A;   Family History  Problem Relation Age of Onset  . Heart disease Mother        heart failure - 87 and 40 when she passed   . Stroke Mother   . Cancer Father        wagners  . Glaucoma Father        wagoner's glaucoma  . Hypertension Other        multiple family members   Social History   Socioeconomic History  . Marital status: Married    Spouse name: Not on file  . Number of children: Not on file  . Years of education: Not on file  . Highest education  level: Not on file  Occupational History  . Occupation: retired    Comment: Physiological scientist  . Occupation: part time    Comment: golf course  Tobacco Use  . Smoking status: Never Smoker  . Smokeless tobacco: Never Used  Substance and Sexual Activity  . Alcohol use: Yes    Alcohol/week: 1.0 standard drinks    Types: 1 Cans of beer per week    Comment: rare  . Drug use: No  . Sexual activity: Yes  Other Topics Concern  . 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 Difficulty of Paying Living Expenses: Not hard at all  Food Insecurity: No Food Insecurity  . Worried About Charity fundraiser in the Last Year: Never true  . Ran Out of Food in the Last Year: Never true  Transportation Needs: No Transportation Needs  . Lack of Transportation (Medical): No  . Lack of Transportation (Non-Medical): No  Physical Activity: Sufficiently Active  . Days of Exercise per Week: 2 days  . Minutes of Exercise per Session: 150+ min  Stress: No Stress Concern Present  . Feeling of Stress : Only a little  Social Connections: Unknown  . Frequency of Communication with Friends and Family: More than three times a week  . Frequency of Social Gatherings with Friends and Family: Twice a week  . Attends Religious Services: Not on file  . Active Member of Clubs or Organizations: Not on file  . Attends Archivist Meetings: Not on file  . Marital Status: Married    Outpatient Encounter Medications as of 03/18/2019  Medication Sig  . allopurinol (ZYLOPRIM) 300 MG tablet TAKE 1 TABLET(300 MG) BY MOUTH DAILY  . amLODipine (NORVASC) 10 MG tablet Take 1 tablet (10 mg total) by mouth daily.  Marland Kitchen aspirin 81 MG chewable tablet Chew by mouth.  . cetirizine (ZYRTEC) 10 MG tablet Take 10 mg  by mouth daily.    . Cholecalciferol (VITAMIN D) 125 MCG (5000 UT) CAPS Take by mouth daily.  . Docusate Calcium ([employer] STOOL SOFTENER PO) Take by mouth daily.   . hydrochlorothiazide (HYDRODIURIL) 12.5 MG tablet TAKE 1 TABLET(12.5 MG) BY MOUTH DAILY  . losartan (COZAAR) 25 MG tablet TAKE 1 TABLET(25 MG) BY MOUTH DAILY  . metFORMIN (GLUCOPHAGE) 500 MG tablet TAKE 1 TABLET BY MOUTH BEFORE BREAKFAST  . Misc Natural Products (FIBER 7 PO) Take 2 tablets by mouth daily.   . Multiple Vitamins-Minerals (DAILY MULTI 50+ PO) Take 1 tablet by mouth daily.  Marland Kitchen  simvastatin (ZOCOR) 20 MG tablet Take 1 tablet (20 mg total) by mouth daily.  . tamsulosin (FLOMAX) 0.4 MG CAPS capsule Take 0.4 mg by mouth daily.  Marland Kitchen terbinafine (LAMISIL) 250 MG tablet Take 1 tablet (250 mg total) by mouth daily. (Patient not taking: Reported on 03/18/2019)   No facility-administered encounter medications on file as of 03/18/2019.    Activities of Daily Living In your present state of health, do you have any difficulty performing the following activities: 03/18/2019  Hearing? N  Vision? N  Difficulty concentrating or making decisions? N  Walking or climbing stairs? N  Dressing or bathing? N  Doing errands, shopping? N  Preparing Food and eating ? N  Using the Toilet? N  In the past six months, have you accidently leaked urine? N  Do you have problems with loss of bowel control? N  Managing your Medications? N  Managing your Finances? N  Housekeeping or managing your Housekeeping? N  Some recent data might be hidden    Patient Care Team: Dorothyann Peng, NP as PCP - General (Family Medicine) Magnus Sinning, MD as Consulting Physician (Physical Medicine and Rehabilitation) Garald Balding, MD as Consulting Physician (Orthopedic Surgery)   Assessment:   This is a routine wellness examination for Jacob Stone.  Exercise Activities and Dietary recommendations Current Exercise Habits: The patient has a physically strenuous job,  but has no regular exercise apart from work., Exercise limited by: orthopedic condition(s)(left hip arthritis occasionally)  Goals    . Weight (lb) < 175 lb (79.4 kg) (pt-stated)     Increase water intake; drink water first thing in mornings and often to avoid desire for snacking. Goal to drink 6 bottles of water daily. Important to eat 3 smaller meals daily with healthy snacks in between. Make sure your portion sizes are not too big which is causing you to overeat without realizing it.        Fall Risk Fall Risk  03/18/2019 07/25/2018 12/10/2017 04/16/2017 06/19/2016  Falls in the past year? 0 0 0 No No  Risk for fall due to : Medication side effect - - - -  Follow up Falls prevention discussed;Education provided;Falls evaluation completed - - - -   Is the patient's home free of loose throw rugs in walkways, pet beds, electrical cords, etc?   yes      Grab bars in the bathroom? yes      Handrails on the stairs?   yes      Adequate lighting?   yes  Timed Get Up and Go Performed: N/A due to telephone visit  Depression Screen PHQ 2/9 Scores 03/18/2019 07/25/2018 12/10/2017 04/16/2017  PHQ - 2 Score 0 0 0 0    Cognitive Function MMSE - Mini Mental State Exam 12/10/2017  Not completed: (No Data)     6CIT Screen 03/18/2019  What Year? 0 points  What month? 0 points  What time? 0 points  Count back from 20 0 points  Months in reverse 0 points  Repeat phrase 0 points  Total Score 0    Immunization History  Administered Date(s) Administered  . Fluad Quad(high Dose 65+) 11/06/2018  . Influenza Split 11/18/2010, 11/10/2011  . Influenza Whole 11/09/2009  . Influenza, High Dose Seasonal PF 11/25/2016, 11/23/2017  . Influenza,inj,Quad PF,6+ Mos 11/28/2012  . Influenza-Unspecified 12/02/2013, 11/28/2014, 11/21/2015  . Pneumococcal Conjugate-13 04/06/2014  . Pneumococcal Polysaccharide-23 06/18/2017  . Td 07/30/2002, 04/06/2014  . Zoster 03/02/2011    Qualifies for Shingles Vaccine?  yes  Screening Tests Health Maintenance  Topic Date Due  . OPHTHALMOLOGY EXAM  04/22/2019  . HEMOGLOBIN A1C  05/29/2019  . COLONOSCOPY  07/08/2019  . FOOT EXAM  07/25/2019  . TETANUS/TDAP  04/05/2024  . INFLUENZA VACCINE  Completed  . Hepatitis C Screening  Completed  . PNA vac Low Risk Adult  Completed   Cancer Screenings: Lung: Low Dose CT Chest recommended if Age 26-80 years, 30 pack-year currently smoking OR have quit w/in 15years. Patient does not qualify. Colorectal: yes  Additional Screenings:  Hepatitis C Screening:completed 07/25/2018.     Plan:   Referral for colonoscopy sent to [place] today to be done in June 2021 per patient request. He plans to have shingrix vaccines after he receives covid vaccines. Discussed need to increase water intake and eating 3 smaller meals and healthy snacks every day. He would like to lose 5-10 pounds and we discussed suggestions on dietary changes to help with that. Scheduled follow up with PCP Dorothyann Peng for 03/27/19.   I have personally reviewed and noted the following in the patient's chart:   . Medical and social history . Use of alcohol, tobacco or illicit drugs  . Current medications and supplements . Functional ability and status . Nutritional status . Physical activity . Advanced directives . List of other physicians . Hospitalizations, surgeries, and ER visits in previous 12 months . Vitals . Screenings to include cognitive, depression, and falls . Referrals and appointments  In addition, I have reviewed and discussed with patient certain preventive protocols, quality metrics, and best practice recommendations. A written personalized care plan for preventive services as well as general preventive health recommendations were provided to patient.     Jacob Forts, LPN  [phone]

## 2019-03-26 ENCOUNTER — Other Ambulatory Visit: Payer: Self-pay

## 2019-03-27 ENCOUNTER — Ambulatory Visit: Payer: Self-pay | Admitting: Adult Health

## 2019-04-09 ENCOUNTER — Other Ambulatory Visit: Payer: Self-pay | Admitting: Adult Health

## 2019-04-10 NOTE — Telephone Encounter (Signed)
Is he coming every 3 or 6 months?  Last A1C was 11/28/2018 and 6.4

## 2019-04-10 NOTE — Telephone Encounter (Signed)
Every six months  

## 2019-04-11 NOTE — Telephone Encounter (Signed)
Sent to the pharmacy by e-scribe.  Pt has been scheduled for follow up.

## 2019-04-11 NOTE — Telephone Encounter (Signed)
Left a message for a return call. Due on after 05/29/2019.

## 2019-04-17 ENCOUNTER — Ambulatory Visit: Payer: Self-pay | Admitting: Adult Health

## 2019-04-21 LAB — HM DIABETES EYE EXAM

## 2019-04-28 ENCOUNTER — Other Ambulatory Visit: Payer: Self-pay

## 2019-04-29 ENCOUNTER — Ambulatory Visit: Payer: Self-pay | Admitting: Adult Health

## 2019-04-29 ENCOUNTER — Encounter: Payer: Self-pay | Admitting: Family Medicine

## 2019-05-21 ENCOUNTER — Other Ambulatory Visit: Payer: Self-pay

## 2019-05-21 ENCOUNTER — Encounter: Payer: Self-pay | Admitting: Orthopaedic Surgery

## 2019-05-21 ENCOUNTER — Ambulatory Visit: Payer: Self-pay

## 2019-05-21 ENCOUNTER — Ambulatory Visit: Payer: Medicare Other | Admitting: Orthopaedic Surgery

## 2019-05-21 VITALS — Ht 69.0 in | Wt 182.0 lb

## 2019-05-21 DIAGNOSIS — M545 Low back pain, unspecified: Secondary | ICD-10-CM | POA: Insufficient documentation

## 2019-05-21 DIAGNOSIS — G8929 Other chronic pain: Secondary | ICD-10-CM

## 2019-05-21 DIAGNOSIS — M25551 Pain in right hip: Secondary | ICD-10-CM | POA: Diagnosis not present

## 2019-05-21 DIAGNOSIS — M5442 Lumbago with sciatica, left side: Secondary | ICD-10-CM

## 2019-05-21 DIAGNOSIS — M5441 Lumbago with sciatica, right side: Secondary | ICD-10-CM | POA: Diagnosis not present

## 2019-05-21 NOTE — Progress Notes (Signed)
Office Visit Note   Patient: Jacob Stone.           Date of Birth: 1950-11-05           MRN: [id number] Visit Date: 05/21/2019              Requested by: Dorothyann Peng, NP [address],  [place] [address] PCP: Dorothyann Peng, NP   Assessment & Plan: Visit Diagnoses:  1. Pain in right hip   2. Chronic bilateral low back pain with bilateral sciatica     Plan: Has had some chronic issues with his back and both hips.  Films of his hips were relatively clear of any significant problems.he has had prior prostate cancer with seed implant.  I believe the pain is experiencing in his back and into his lower extremities might be referred from the lumbar spine is worth obtaining an MRI scan.  He does have some degenerative changes and might very well have some stenosis  Follow-Up Instructions: Return After MRI lumbar spine.   Orders:  Orders Placed This Encounter  Procedures  . XR HIP UNILAT W OR W/O PELVIS 2-3 VIEWS RIGHT  . XR Lumbar Spine 2-3 Views  . MR Lumbar Spine w/o contrast   No orders of the defined types were placed in this encounter.     Procedures: No procedures performed   Clinical Data: No additional findings.   Subjective: Chief Complaint  Patient presents with  . Right Hip - Pain  Patient presents today for right hip pain. Patient states that it has been hurting for two weeks. He states that his pain is located laterally. No groin pain. He has pain that travels down his leg. Some numbness and tingling. He states that his knee is weaker, and finds it hard to push off from. He said that he is stiff until he gets to moving more. He has only taken Tylenol if needed for pain. He also has some lower back pain, but states that this is not connected to his hip pain.  Has some difficulty trying to sleep and finding a comfortable position and even sleeping on one side or the other. seems to be better once he gets up and gets moving Type II diabetic  taking Metformin  HPI  Review of Systems   Objective: Vital Signs: Ht 5\' 9"  (1.753 m)   Wt 182 lb (82.6 kg)   BMI 26.88 kg/m   Physical Exam Constitutional:      Appearance: He is well-developed.  Eyes:     Pupils: Pupils are equal, round, and reactive to light.  Pulmonary:     Effort: Pulmonary effort is normal.  Skin:    General: Skin is warm and dry.  Neurological:     Mental Status: He is alert and oriented to person, place, and time.  Psychiatric:        Behavior: Behavior normal.     Ortho Exam awake alert and oriented x3.  Comfortable sitting.  No pain with range of motion of either hip and internal and external rotation flexion or extension.  Hamstrings are little bit tight but straight leg raise appeared to be negative.  Does have arthritis in both knees.  Motor exam intact.  No percussible tenderness of lumbar spine.  No localized tenderness over either greater trochanter Specialty Comments:  No specialty comments available.  Imaging: XR Lumbar Spine 2-3 Views  Result Date: 05/21/2019 Films lumbar spine obtained in 2 projections.  There  was a very minimal degenerative scoliosis at the thoracolumbar junction.  Minimal anterior listhesis of L5 on S1 but without obvious pars defect.  Disc spaces appear to be well-maintained.  Facet sclerosis at L4-5 and L5-S1.  No obvious compression fracture.  Minimal calcification of the abdominal aorta.  Prior prostate seed insertion    PMFS History: Patient Active Problem List   Diagnosis Date Noted  . Low back pain 05/21/2019  . Malignant neoplasm of prostate ([place]) 04/16/2017  . Unilateral primary osteoarthritis, left knee 11/16/2016  . OSA (obstructive sleep apnea) 04/18/2016  . Sleep dysfunction with arousal disturbance 01/17/2016  . Hyperlipidemia 09/14/2014  . Diabetes 1.5, managed as type 2 ([place]) 07/07/2014  . Carotid artery calcification 03/02/2011  . Malignant neoplasm of kidney excluding renal pelvis ([place])  11/14/2007  . ALLERGIC RHINITIS 11/14/2007  . DIVERTICULOSIS, COLON 04/16/2007  . Essential hypertension 04/16/2007   Past Medical History:  Diagnosis Date  . Carotid artery calcification 03/2011   MILD HARD PLAQUE LEFT BULB, 0-39% BIL. ICA STENOSIS, PATENT VERTEBRAL ARTERIES WITH ANTEGRADE FLOW  . Diabetes type 2, controlled ([place])   . Diverticulitis   . Fatty liver   . FH: kidney cancer 2005   right kidney removed   . GERD (gastroesophageal reflux disease)   . Gout   . History of kidney stones    Left kidney  . HTN (hypertension)   . Hyperlipidemia   . OA (osteoarthritis)    Knee  . OSA (obstructive sleep apnea) 04/18/2016   SEVERE; UNABLE TO WEAR CPAP MORE THAN 2-3 HOURS PER NIGHT  . Plantar fasciitis   . Prostate cancer ([place])   . Seasonal allergies     Family History  Problem Relation Age of Onset  . Heart disease Mother        heart failure - 2 and 48 when she passed   . Stroke Mother   . Cancer Father        wagners  . Glaucoma Father        wagoner's glaucoma  . Hypertension Other        multiple family members    Past Surgical History:  Procedure Laterality Date  . COLON SURGERY  2011   Partial colon removed due to diverticulitis  . COLONOSCOPY  08/2009  . NEPHRECTOMY Right   . PROSTATE BIOPSY    . RADIOACTIVE SEED IMPLANT N/A 08/03/2017   Procedure: RADIOACTIVE SEED IMPLANT/BRACHYTHERAPY IMPLANT;  Surgeon: Kathie Rhodes, MD;  Location: [facility];  Service: Urology;  Laterality: N/A;  . SPACE OAR INSTILLATION N/A 08/03/2017   Procedure: SPACE OAR INSTILLATION;  Surgeon: Kathie Rhodes, MD;  Location: [facility];  Service: Urology;  Laterality: N/A;   Social History   Occupational History  . Occupation: retired    Comment: Physiological scientist  . Occupation: part time    Comment: golf course  Tobacco Use  . Smoking status: Never Smoker  . Smokeless tobacco: Never Used  Substance and Sexual Activity  . Alcohol use: Yes     Alcohol/week: 1.0 standard drinks    Types: 1 Cans of beer per week    Comment: rare  . Drug use: No  . Sexual activity: Yes

## 2019-05-26 ENCOUNTER — Encounter: Payer: Self-pay | Admitting: Adult Health

## 2019-05-26 LAB — HM COLONOSCOPY

## 2019-06-03 ENCOUNTER — Other Ambulatory Visit: Payer: Self-pay

## 2019-06-04 ENCOUNTER — Ambulatory Visit (INDEPENDENT_AMBULATORY_CARE_PROVIDER_SITE_OTHER): Payer: Medicare Other | Admitting: Adult Health

## 2019-06-04 ENCOUNTER — Encounter: Payer: Self-pay | Admitting: Family Medicine

## 2019-06-04 ENCOUNTER — Encounter: Payer: Self-pay | Admitting: Adult Health

## 2019-06-04 VITALS — BP 114/76 | Temp 98.4°F | Wt 189.0 lb

## 2019-06-04 DIAGNOSIS — E139 Other specified diabetes mellitus without complications: Secondary | ICD-10-CM | POA: Diagnosis not present

## 2019-06-04 LAB — POCT GLYCOSYLATED HEMOGLOBIN (HGB A1C): HbA1c, POC (controlled diabetic range): 5.9 % (ref 0.0–7.0)

## 2019-06-04 NOTE — Progress Notes (Signed)
Subjective:    Patient ID: Jacob Sermons., male    DOB: April 17, 1950, 69 y.o.   MRN: [id number]  HPI 69 year old male who  has a past medical history of Carotid artery calcification (03/2011), Diabetes type 2, controlled ([place]), Diverticulitis, Fatty liver, FH: kidney cancer (2005), GERD (gastroesophageal reflux disease), Gout, History of kidney stones, HTN (hypertension), Hyperlipidemia, OA (osteoarthritis), OSA (obstructive sleep apnea) (04/18/2016), Plantar fasciitis, Prostate cancer ([place]), and Seasonal allergies.  He presents to the office today for follow up regarding DM.  He is currently maintained on metformin 500 mg daily.  He does not monitor his blood sugars at home but denies episodes of hypoglycemia.  His A1c levels have been well controlled on this regimen for many years.  Lab Results  Component Value Date   HGBA1C 6.4 11/28/2018    Review of Systems See HPI   Past Medical History:  Diagnosis Date  . Carotid artery calcification 03/2011   MILD HARD PLAQUE LEFT BULB, 0-39% BIL. ICA STENOSIS, PATENT VERTEBRAL ARTERIES WITH ANTEGRADE FLOW  . Diabetes type 2, controlled ([place])   . Diverticulitis   . Fatty liver   . FH: kidney cancer 2005   right kidney removed   . GERD (gastroesophageal reflux disease)   . Gout   . History of kidney stones    Left kidney  . HTN (hypertension)   . Hyperlipidemia   . OA (osteoarthritis)    Knee  . OSA (obstructive sleep apnea) 04/18/2016   SEVERE; UNABLE TO WEAR CPAP MORE THAN 2-3 HOURS PER NIGHT  . Plantar fasciitis   . Prostate cancer ([place])   . Seasonal allergies     Social History   Socioeconomic History  . Marital status: Married    Spouse name: Not on file  . Number of children: Not on file  . Years of education: Not on file  . Highest education level: Not on file  Occupational History  . Occupation: retired    Comment: Physiological scientist  . Occupation: part time    Comment: golf course  Tobacco Use  . Smoking status:  Never Smoker  . Smokeless tobacco: Never Used  Substance and Sexual Activity  . Alcohol use: Yes    Alcohol/week: 1.0 standard drinks    Types: 1 Cans of beer per week    Comment: rare  . Drug use: No  . Sexual activity: Yes  Other Topics Concern  . 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 Difficulty of Paying Living Expenses: Not hard at all  Food Insecurity: No Food Insecurity  . Worried About Charity fundraiser in the Last Year: Never true  . Ran Out of Food in the Last Year: Never true  Transportation Needs: No Transportation Needs  . Lack of Transportation (Medical): No  . Lack of Transportation (Non-Medical): No  Physical Activity: Sufficiently Active  . Days of Exercise per Week: 2 days  . Minutes of Exercise per Session: 150+ min  Stress: No Stress Concern Present  . Feeling of Stress : Only a little  Social Connections: Unknown  . Frequency of Communication with Friends and Family: More than three times a week  . Frequency of Social Gatherings with  Friends and Family: Twice a week  . Attends Religious Services: Not on file  . Active Member of Clubs or Organizations: Not on file  . Attends Archivist Meetings: Not on file  . Marital Status: Married  Human resources officer Violence:   . Fear of Current or Ex-Partner:   . Emotionally Abused:   Marland Kitchen Physically Abused:   . Sexually Abused:     Past Surgical History:  Procedure Laterality Date  . COLON SURGERY  2011   Partial colon removed due to diverticulitis  . COLONOSCOPY  08/2009  . NEPHRECTOMY Right   . PROSTATE BIOPSY    . RADIOACTIVE SEED IMPLANT N/A 08/03/2017   Procedure: RADIOACTIVE SEED IMPLANT/BRACHYTHERAPY IMPLANT;  Surgeon: Kathie Rhodes, MD;  Location[facility];   Service: Urology;  Laterality: N/A;  . SPACE OAR INSTILLATION N/A 08/03/2017   Procedure: SPACE OAR INSTILLATION;  Surgeon: Kathie Rhodes, MD;  Location[facility];  Service: Urology;  Laterality: N/A;    Family History  Problem Relation Age of Onset  . Heart disease Mother        heart failure - 41 and 41 when she passed   . Stroke Mother   . Cancer Father        wagners  . Glaucoma Father        wagoner's glaucoma  . Hypertension Other        multiple family members    Allergies  Allergen Reactions  . Oxycodone Other (See Comments)    Patient stated he has nightmares with oxycodone    Current Outpatient Medications on File Prior to Visit  Medication Sig Dispense Refill  . allopurinol (ZYLOPRIM) 300 MG tablet TAKE 1 TABLET(300 MG) BY MOUTH DAILY 90 tablet 2  . amLODipine (NORVASC) 10 MG tablet Take 1 tablet (10 mg total) by mouth daily. 90 tablet 3  . aspirin 81 MG chewable tablet Chew by mouth.    . cetirizine (ZYRTEC) 10 MG tablet Take 10 mg by mouth daily.      . Cholecalciferol (VITAMIN D) 125 MCG (5000 UT) CAPS Take by mouth daily.    . Docusate Calcium ([employer] STOOL SOFTENER PO) Take by mouth daily.     . hydrochlorothiazide (HYDRODIURIL) 12.5 MG tablet TAKE 1 TABLET(12.5 MG) BY MOUTH DAILY 90 tablet 3  . losartan (COZAAR) 25 MG tablet TAKE 1 TABLET(25 MG) BY MOUTH DAILY 90 tablet 3  . metFORMIN (GLUCOPHAGE) 500 MG tablet TAKE 1 TABLET BY MOUTH BEFORE BREAKFAST 90 tablet 0  . Misc Natural Products (FIBER 7 PO) Take 2 tablets by mouth daily.     . Multiple Vitamins-Minerals (DAILY MULTI 50+ PO) Take 1 tablet by mouth daily.    . simvastatin (ZOCOR) 20 MG tablet Take 1 tablet (20 mg total) by mouth daily. 90 tablet 3  . tamsulosin (FLOMAX) 0.4 MG CAPS capsule Take 0.4 mg by mouth daily.    Marland Kitchen terbinafine (LAMISIL) 250 MG tablet Take 1 tablet (250 mg total) by mouth daily. 30 tablet 2   No current facility-administered medications on file prior to visit.     There were no vitals taken for this visit.      Objective:   Physical Exam Vitals and nursing note reviewed.  Constitutional:      Appearance: Normal appearance.  Skin:    General: Skin is warm and dry.     Capillary Refill: Capillary refill takes less than 2 seconds.  Neurological:     General: No  focal deficit present.     Mental Status: He is alert and oriented to person, place, and time.  Psychiatric:        Mood and Affect: Mood normal.        Behavior: Behavior normal.        Thought Content: Thought content normal.        Judgment: Judgment normal.        Assessment & Plan:  1. Diabetes 1.5, managed as type 2 ([place])  - POCT A1C- 5.9 - at goal  - Continue with Metformin 500 mg daily - Will retest at his physical in [place], NP

## 2019-06-10 ENCOUNTER — Telehealth: Payer: Self-pay | Admitting: Orthopaedic Surgery

## 2019-06-10 NOTE — Telephone Encounter (Signed)
LM on voicemail for patient to call the office to schedule his MRI review with Dr. Durward Fortes after May 21st.  Thank you.

## 2019-06-27 ENCOUNTER — Ambulatory Visit
Admission: RE | Admit: 2019-06-27 | Discharge: 2019-06-27 | Disposition: A | Discharge status: Home / Self Care | Payer: Medicare Other | Source: Ambulatory Visit | Attending: Orthopaedic Surgery | Admitting: Orthopaedic Surgery

## 2019-06-27 ENCOUNTER — Other Ambulatory Visit: Payer: Self-pay

## 2019-06-27 DIAGNOSIS — M25551 Pain in right hip: Secondary | ICD-10-CM

## 2019-07-01 ENCOUNTER — Encounter: Payer: Self-pay | Admitting: Orthopaedic Surgery

## 2019-07-01 ENCOUNTER — Other Ambulatory Visit: Payer: Self-pay

## 2019-07-01 ENCOUNTER — Ambulatory Visit: Payer: Medicare Other | Admitting: Orthopaedic Surgery

## 2019-07-01 VITALS — Ht 69.0 in | Wt 189.0 lb

## 2019-07-01 DIAGNOSIS — M5441 Lumbago with sciatica, right side: Secondary | ICD-10-CM

## 2019-07-01 DIAGNOSIS — M5442 Lumbago with sciatica, left side: Secondary | ICD-10-CM

## 2019-07-01 DIAGNOSIS — G8929 Other chronic pain: Secondary | ICD-10-CM

## 2019-07-01 MED ORDER — TRAMADOL HCL 50 MG PO TABS: 50.0000 mg | ORAL_TABLET | Freq: Three times a day (TID) | ORAL | 0 refills | Status: DC | PRN

## 2019-07-01 NOTE — Progress Notes (Deleted)
Office Visit Note   Patient: Jacob Stone.           Date of Birth: September 13, 1950           MRN: [id number] Visit Date: 07/01/2019              Requested by: Dorothyann Peng, NP [address],  [place] [address] PCP: Dorothyann Peng, NP   Assessment & Plan: Visit Diagnoses: No diagnosis found.  Plan: ***  Follow-Up Instructions: No follow-ups on file.   Orders:  No orders of the defined types were placed in this encounter.  No orders of the defined types were placed in this encounter.     Procedures: No procedures performed   Clinical Data: No additional findings.   Subjective: Chief Complaint  Patient presents with  . Lower Back - Follow-up  Patient presents today for follow up on his lower back. He had an MRI on 06/27/2019. He states that nothing has changed since his last visit. He has increased stiffness with sitting, that gets better after walking. He is taking Tylenol for pain.   HPI  Review of Systems   Objective: Vital Signs: There were no vitals taken for this visit.  Physical Exam  Ortho Exam  Specialty Comments:  No specialty comments available.  Imaging: No results found.   PMFS History: Patient Active Problem List   Diagnosis Date Noted  . Low back pain 05/21/2019  . Malignant neoplasm of prostate ([place]) 04/16/2017  . Unilateral primary osteoarthritis, left knee 11/16/2016  . OSA (obstructive sleep apnea) 04/18/2016  . Sleep dysfunction with arousal disturbance 01/17/2016  . Hyperlipidemia 09/14/2014  . Diabetes 1.5, managed as type 2 ([place]) 07/07/2014  . Carotid artery calcification 03/02/2011  . Malignant neoplasm of kidney excluding renal pelvis ([place]) 11/14/2007  . ALLERGIC RHINITIS 11/14/2007  . DIVERTICULOSIS, COLON 04/16/2007  . Essential hypertension 04/16/2007   Past Medical History:  Diagnosis Date  . Carotid artery calcification 03/2011   MILD HARD PLAQUE LEFT BULB, 0-39% BIL. ICA STENOSIS, PATENT  VERTEBRAL ARTERIES WITH ANTEGRADE FLOW  . Diabetes type 2, controlled ([place])   . Diverticulitis   . Fatty liver   . FH: kidney cancer 2005   right kidney removed   . GERD (gastroesophageal reflux disease)   . Gout   . History of kidney stones    Left kidney  . HTN (hypertension)   . Hyperlipidemia   . OA (osteoarthritis)    Knee  . OSA (obstructive sleep apnea) 04/18/2016   SEVERE; UNABLE TO WEAR CPAP MORE THAN 2-3 HOURS PER NIGHT  . Plantar fasciitis   . Prostate cancer ([place])   . Seasonal allergies     Family History  Problem Relation Age of Onset  . Heart disease Mother        heart failure - 57 and 14 when she passed   . Stroke Mother   . Cancer Father        wagners  . Glaucoma Father        wagoner's glaucoma  . Hypertension Other        multiple family members    Past Surgical History:  Procedure Laterality Date  . COLON SURGERY  2011   Partial colon removed due to diverticulitis  . COLONOSCOPY  08/2009  . NEPHRECTOMY Right   . PROSTATE BIOPSY    . RADIOACTIVE SEED IMPLANT N/A 08/03/2017   Procedure: RADIOACTIVE SEED IMPLANT/BRACHYTHERAPY IMPLANT;  Surgeon: Kathie Rhodes, MD;  Location:  [place];  Service: Urology;  Laterality: N/A;  . SPACE OAR INSTILLATION N/A 08/03/2017   Procedure: SPACE OAR INSTILLATION;  Surgeon: Kathie Rhodes, MD;  [facility];  Service: Urology;  Laterality: N/A;   Social History   Occupational History  . Occupation: retired    Comment: Physiological scientist  . Occupation: part time    Comment: golf course  Tobacco Use  . Smoking status: Never Smoker  . Smokeless tobacco: Never Used  Substance and Sexual Activity  . Alcohol use: Yes    Alcohol/week: 1.0 standard drinks    Types: 1 Cans of beer per week    Comment: rare  . Drug use: No  . Sexual activity: Yes

## 2019-07-01 NOTE — Progress Notes (Signed)
Office Visit Note   Patient: Jacob Stone.           Date of Birth: 06/16/1950           MRN: [id number] Visit Date: 07/01/2019              Requested by: Jacob Peng, NP [address],  [place] [address] PCP: Jacob Peng, NP   Assessment & Plan: Visit Diagnoses:  1. Chronic bilateral low back pain with bilateral sciatica   Jacob Stone had an MRI scan of his lumbar spine demonstrating central protrusion and annular fissuring at L4-5.  The left lateral recess was narrowed greater than on the right possibly affecting the L5 nerve roots.  There was moderate right and severe left L4-5 neural foraminal narrowing.  Mild spinal canal narrowing at L3-4 and L4-5.  He has been experiencing some back pain with bilateral radiculopathy L bit more on the right than the left.  Neurologically he appeared to be intact.  We will try tramadol at night for pain and asked Dr. Ernestina Patches to perform an epidural steroid injection.  Return in 1 month.  Consider physical therapy  Plan:   Follow-Up Instructions: Return in about 1 month (around 08/01/2019).   Orders:  No orders of the defined types were placed in this encounter.  Meds ordered this encounter  Medications  . traMADol (ULTRAM) 50 MG tablet    Sig: Take 1 tablet (50 mg total) by mouth 3 (three) times daily as needed.    Dispense:  30 tablet    Refill:  0      Procedures: No procedures performed   Clinical Data: No additional findings.   Subjective: Chief Complaint  Patient presents with  . Lower Back - Follow-up  Jacob Stone returns for evaluation of his low back pain.  He did have an MRI scan which we will review today.  He is having bilateral back pain and bilateral leg pain more on the right than the left.  He has had some trouble sleeping at night.  He seems a little better when he gets up and gets moving.  HPI  Review of Systems   Objective: Vital Signs: Ht 5\' 9"  (1.753 m)   Wt 189 lb (85.7 kg)   BMI 27.91  kg/m   Physical Exam Constitutional:      Appearance: He is well-developed.  Eyes:     Pupils: Pupils are equal, round, and reactive to light.  Pulmonary:     Effort: Pulmonary effort is normal.  Skin:    General: Skin is warm and dry.  Neurological:     Mental Status: He is alert and oriented to person, place, and time.  Psychiatric:        Behavior: Behavior normal.     Ortho Exam awake alert and oriented x3.  Comfortable sitting.  Straight leg raise negative bilaterally.  Painless range of motion of both hips with internal and external rotation.  Reflexes were symmetrical.  Motor and sensory exam appears to be intact.  No percussible tenderness of lumbar spine  Specialty Comments:  No specialty comments available.  Imaging: No results found.   PMFS History: Patient Active Problem List   Diagnosis Date Noted  . Low back pain 05/21/2019  . Malignant neoplasm of prostate ([place]) 04/16/2017  . Unilateral primary osteoarthritis, left knee 11/16/2016  . OSA (obstructive sleep apnea) 04/18/2016  . Sleep dysfunction with arousal disturbance 01/17/2016  . Hyperlipidemia 09/14/2014  .  Diabetes 1.5, managed as type 2 ([place]) 07/07/2014  . Carotid artery calcification 03/02/2011  . Malignant neoplasm of kidney excluding renal pelvis ([place]) 11/14/2007  . ALLERGIC RHINITIS 11/14/2007  . DIVERTICULOSIS, COLON 04/16/2007  . Essential hypertension 04/16/2007   Past Medical History:  Diagnosis Date  . Carotid artery calcification 03/2011   MILD HARD PLAQUE LEFT BULB, 0-39% BIL. ICA STENOSIS, PATENT VERTEBRAL ARTERIES WITH ANTEGRADE FLOW  . Diabetes type 2, controlled ([place])   . Diverticulitis   . Fatty liver   . FH: kidney cancer 2005   right kidney removed   . GERD (gastroesophageal reflux disease)   . Gout   . History of kidney stones    Left kidney  . HTN (hypertension)   . Hyperlipidemia   . OA (osteoarthritis)    Knee  . OSA (obstructive sleep apnea) 04/18/2016   SEVERE;  UNABLE TO WEAR CPAP MORE THAN 2-3 HOURS PER NIGHT  . Plantar fasciitis   . Prostate cancer ([place])   . Seasonal allergies     Family History  Problem Relation Age of Onset  . Heart disease Mother        heart failure - 7 and 81 when she passed   . Stroke Mother   . Cancer Father        wagners  . Glaucoma Father        wagoner's glaucoma  . Hypertension Other        multiple family members    Past Surgical History:  Procedure Laterality Date  . COLON SURGERY  2011   Partial colon removed due to diverticulitis  . COLONOSCOPY  08/2009  . NEPHRECTOMY Right   . PROSTATE BIOPSY    . RADIOACTIVE SEED IMPLANT N/A 08/03/2017   Procedure: RADIOACTIVE SEED IMPLANT/BRACHYTHERAPY IMPLANT;  Surgeon: Kathie Rhodes, MD;  Location: [facility];  Service: Urology;  Laterality: N/A;  . SPACE OAR INSTILLATION N/A 08/03/2017   Procedure: SPACE OAR INSTILLATION;  Surgeon: Kathie Rhodes, MD;  Location: [facility];  Service: Urology;  Laterality: N/A;   Social History   Occupational History  . Occupation: retired    Comment: Physiological scientist  . Occupation: part time    Comment: golf course  Tobacco Use  . Smoking status: Never Smoker  . Smokeless tobacco: Never Used  Substance and Sexual Activity  . Alcohol use: Yes    Alcohol/week: 1.0 standard drinks    Types: 1 Cans of beer per week    Comment: rare  . Drug use: No  . Sexual activity: Yes     Garald Balding, MD   Note - This record has been created using [employer].  Chart creation errors have been sought, but may not always  have been located. Such creation errors do not reflect on  the standard of medical care.

## 2019-07-08 ENCOUNTER — Other Ambulatory Visit: Payer: Self-pay | Admitting: Adult Health

## 2019-07-09 ENCOUNTER — Other Ambulatory Visit: Payer: Self-pay

## 2019-07-09 ENCOUNTER — Ambulatory Visit
Admission: RE | Admit: 2019-07-09 | Discharge: 2019-07-09 | Disposition: A | Discharge status: Home / Self Care | Payer: Medicare Other | Source: Ambulatory Visit | Attending: Orthopaedic Surgery | Admitting: Orthopaedic Surgery

## 2019-07-09 DIAGNOSIS — G8929 Other chronic pain: Secondary | ICD-10-CM

## 2019-07-09 MED ORDER — IOPAMIDOL (ISOVUE-M 200) INJECTION 41%
1.0000 mL | Freq: Once | INTRAMUSCULAR | Status: AC
  Administered 2019-07-09: 1 mL via EPIDURAL

## 2019-07-09 MED ORDER — METHYLPREDNISOLONE ACETATE 40 MG/ML INJ SUSP (RADIOLOG
120.0000 mg | Freq: Once | INTRAMUSCULAR | Status: AC
  Administered 2019-07-09: 120 mg via EPIDURAL

## 2019-07-09 NOTE — Discharge Instructions (Signed)

## 2019-07-10 NOTE — Telephone Encounter (Signed)
SENT TO THE PHARMACY BY E-SCRIBE FOR 90 DAYS.  PT HAS UPCOMING CPX. 

## 2019-07-11 ENCOUNTER — Other Ambulatory Visit: Payer: Self-pay | Admitting: Adult Health

## 2019-07-11 NOTE — Telephone Encounter (Signed)
SENT TO THE PHARMACY BY E-SCRIBE FOR 90 DAYS.  PT HAS UPCOMING CPX.

## 2019-07-20 ENCOUNTER — Other Ambulatory Visit: Payer: Self-pay | Admitting: Adult Health

## 2019-07-20 DIAGNOSIS — E785 Hyperlipidemia, unspecified: Secondary | ICD-10-CM

## 2019-07-20 DIAGNOSIS — I10 Essential (primary) hypertension: Secondary | ICD-10-CM

## 2019-07-21 NOTE — Telephone Encounter (Signed)
Sent to the pharmacy by e-scribe. Pt has upcoming cpx on 08/08/19.

## 2019-08-08 ENCOUNTER — Other Ambulatory Visit: Payer: Self-pay

## 2019-08-08 ENCOUNTER — Encounter: Payer: Self-pay | Admitting: Adult Health

## 2019-08-08 ENCOUNTER — Ambulatory Visit (INDEPENDENT_AMBULATORY_CARE_PROVIDER_SITE_OTHER): Payer: Medicare Other | Admitting: Adult Health

## 2019-08-08 VITALS — BP 138/78 | Temp 97.9°F | Ht 69.75 in | Wt 193.0 lb

## 2019-08-08 DIAGNOSIS — E139 Other specified diabetes mellitus without complications: Secondary | ICD-10-CM | POA: Diagnosis not present

## 2019-08-08 DIAGNOSIS — Z0000 Encounter for general adult medical examination without abnormal findings: Secondary | ICD-10-CM | POA: Diagnosis not present

## 2019-08-08 DIAGNOSIS — G4733 Obstructive sleep apnea (adult) (pediatric): Secondary | ICD-10-CM

## 2019-08-08 DIAGNOSIS — C61 Malignant neoplasm of prostate: Secondary | ICD-10-CM

## 2019-08-08 DIAGNOSIS — E785 Hyperlipidemia, unspecified: Secondary | ICD-10-CM | POA: Diagnosis not present

## 2019-08-08 DIAGNOSIS — I10 Essential (primary) hypertension: Secondary | ICD-10-CM

## 2019-08-08 LAB — COMPREHENSIVE METABOLIC PANEL
ALT: 48 U/L (ref 0–53)
AST: 30 U/L (ref 0–37)
Albumin: 4.5 g/dL (ref 3.5–5.2)
Alkaline Phosphatase: 68 U/L (ref 39–117)
BUN: 21 mg/dL (ref 6–23)
CO2: 29 mEq/L (ref 19–32)
Calcium: 9.3 mg/dL (ref 8.4–10.5)
Chloride: 103 mEq/L (ref 96–112)
Creatinine, Ser: 1.37 mg/dL (ref 0.40–1.50)
GFR: 51.15 mL/min — ABNORMAL LOW (ref >60.00)
Glucose, Bld: 114 mg/dL — ABNORMAL HIGH (ref 70–99)
Potassium: 4.1 mEq/L (ref 3.5–5.1)
Sodium: 141 mEq/L (ref 135–145)
Total Bilirubin: 0.5 mg/dL (ref 0.2–1.2)
Total Protein: 6.8 g/dL (ref 6.0–8.3)

## 2019-08-08 LAB — LIPID PANEL
Cholesterol: 127 mg/dL (ref 0–200)
HDL: 48.8 mg/dL (ref >39.00)
LDL Cholesterol: 43 mg/dL (ref 0–99)
NonHDL: 78.46
Total CHOL/HDL Ratio: 3
Triglycerides: 179 mg/dL — ABNORMAL HIGH (ref 0.0–149.0)
VLDL: 35.8 mg/dL (ref 0.0–40.0)

## 2019-08-08 LAB — CBC WITH DIFFERENTIAL/PLATELET
Basophils Absolute: 0 10*3/uL (ref 0.0–0.1)
Basophils Relative: 0.7 % (ref 0.0–3.0)
Eosinophils Absolute: 0.3 10*3/uL (ref 0.0–0.7)
Eosinophils Relative: 5.7 % — ABNORMAL HIGH (ref 0.0–5.0)
HCT: 46 % (ref 39.0–52.0)
Hemoglobin: 15.4 g/dL (ref 13.0–17.0)
Lymphocytes Relative: 31.6 % (ref 12.0–46.0)
Lymphs Abs: 1.7 10*3/uL (ref 0.7–4.0)
MCHC: 33.4 g/dL (ref 30.0–36.0)
MCV: 94.8 fl (ref 78.0–100.0)
Monocytes Absolute: 0.5 10*3/uL (ref 0.1–1.0)
Monocytes Relative: 9.4 % (ref 3.0–12.0)
Neutro Abs: 2.8 10*3/uL (ref 1.4–7.7)
Neutrophils Relative %: 52.6 % (ref 43.0–77.0)
Platelets: 254 10*3/uL (ref 150.0–400.0)
RBC: 4.85 Mil/uL (ref 4.22–5.81)
RDW: 13.9 % (ref 11.5–15.5)
WBC: 5.4 10*3/uL (ref 4.0–10.5)

## 2019-08-08 LAB — TSH: TSH: 2.43 u[IU]/mL (ref 0.35–4.50)

## 2019-08-08 LAB — HEMOGLOBIN A1C: Hgb A1c MFr Bld: 6.6 % — ABNORMAL HIGH (ref 4.6–6.5)

## 2019-08-08 NOTE — Patient Instructions (Addendum)
It was great seeing you today   We will follow up with you regarding your blood work     [phone]

## 2019-08-08 NOTE — Progress Notes (Signed)
Subjective:    Patient ID: Jacob Stone., male    DOB: 16-Jan-1951, 69 y.o.   MRN: [id number]  HPI  Patient presents for yearly preventative medicine examination. He is a pleasant 69 year old male who  has a past medical history of Carotid artery calcification (03/2011), Diabetes type 2, controlled ([place]), Diverticulitis, Fatty liver, FH: kidney cancer (2005), GERD (gastroesophageal reflux disease), Gout, History of kidney stones, HTN (hypertension), Hyperlipidemia, OA (osteoarthritis), OSA (obstructive sleep apnea) (04/18/2016), Plantar fasciitis, Prostate cancer ([name]), and Seasonal allergies.  DM II -prescribed Metformin 500 mg daily.  He does not monitor his blood sugars at home.  He denies signs or symptoms of hypoglycemia. Lab Results  Component Value Date   HGBA1C 5.9 06/04/2019   Essential Hypertension -currently prescribed Norvasc 10 mg, hydrochlorothiazide 12.5 mg, and Cozaar 25 mg daily.  He does monitor his blood pressures at home and reports readings in the 120s to 130s over 70s to 80s.  He denies episodes of dizziness, lightheadedness, chest pain, shortness of breath, or headaches. BP Readings from Last 3 Encounters:  08/08/19 138/78  07/09/19 124/79  06/04/19 114/76   Hyperlipidemia-takes Zocor 20 mg daily.  Denies myalgia or fatigue Lab Results  Component Value Date   CHOL 114 07/25/2018   HDL 43.30 07/25/2018   LDLCALC 31 07/25/2018   LDLDIRECT 103.0 07/01/2014   TRIG 198.0 (H) 07/25/2018   CHOLHDL 3 07/25/2018   History of gout-currently on allopurinol 300 mg daily.  He denies any recent gout flares  History of prostate cancer-seed implant in June 2019.  [facility] 6 months  OSA-unable to tolerate CPAP in the past- he is going to call pulmonary and talk to them about getting back on CPAP.   All immunizations and health maintenance protocols were reviewed with the patient and needed orders were placed.  Appropriate screening  laboratory values were ordered for the patient including screening of hyperlipidemia, renal function and hepatic function. If indicated by BPH, a PSA was ordered.  Medication reconciliation,  past medical history, social history, problem list and allergies were reviewed in detail with the patient  Goals were established with regard to weight loss, exercise, and  diet in compliance with medications Wt Readings from Last 3 Encounters:  08/08/19 193 lb (87.5 kg)  07/01/19 189 lb (85.7 kg)  06/04/19 189 lb (85.7 kg)   He is up-to-date on routine colon cancer screening  Review of Systems  Constitutional: Negative.   HENT: Negative.   Eyes: Negative.   Respiratory: Negative.   Cardiovascular: Negative.   Gastrointestinal: Negative.   Endocrine: Negative.   Genitourinary: Negative.   Musculoskeletal: Negative.   Skin: Negative.   Allergic/Immunologic: Negative.   Neurological: Negative.   Hematological: Negative.   Psychiatric/Behavioral: Negative.   All other systems reviewed and are negative.  Past Medical History:  Diagnosis Date  . Carotid artery calcification 03/2011   MILD HARD PLAQUE LEFT BULB, 0-39% BIL. ICA STENOSIS, PATENT VERTEBRAL ARTERIES WITH ANTEGRADE FLOW  . Diabetes type 2, controlled ([name])   . Diverticulitis   . Fatty liver   . FH: kidney cancer 2005   right kidney removed   . GERD (gastroesophageal reflux disease)   . Gout   . History of kidney stones    Left kidney  . HTN (hypertension)   . Hyperlipidemia   . OA (osteoarthritis)    Knee  . OSA (obstructive sleep apnea) 04/18/2016   SEVERE; UNABLE TO WEAR  CPAP MORE THAN 2-3 HOURS PER NIGHT  . Plantar fasciitis   . Prostate cancer ([name])   . Seasonal allergies     Social History   Socioeconomic History  . Marital status: Married    Spouse name: Not on file  . Number of children: Not on file  . Years of education: Not on file  . Highest education level: Not on file  Occupational History  .  Occupation: retired    Comment: Physiological scientist  . Occupation: part time    Comment: golf course  Tobacco Use  . Smoking status: Never Smoker  . Smokeless tobacco: Never Used  Vaping Use  . Vaping Use: Never used  Substance and Sexual Activity  . Alcohol use: Yes    Alcohol/week: 1.0 standard drink    Types: 1 Cans of beer per week    Comment: rare  . Drug use: No  . Sexual activity: Yes  Other Topics Concern  . 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 Difficulty of Paying Living Expenses: Not hard at all  Food Insecurity: No Food Insecurity  . Worried About Charity fundraiser in the Last Year: Never true  . Ran Out of Food in the Last Year: Never true  Transportation Needs: No Transportation Needs  . Lack of Transportation (Medical): No  . Lack of Transportation (Non-Medical): No  Physical Activity: Sufficiently Active  . Days of Exercise per Week: 2 days  . Minutes of Exercise per Session: 150+ min  Stress: No Stress Concern Present  . Feeling of Stress : Only a little  Social Connections: Unknown  . Frequency of Communication with Friends and Family: More than three times a week  . Frequency of Social Gatherings with Friends and Family: Twice a week  . Attends Religious Services: Not on file  . Active Member of Clubs or Organizations: Not on file  . Attends Archivist Meetings: Not on file  . Marital Status: Married  Human resources officer Violence:   . Fear of Current or Ex-Partner:   . Emotionally Abused:   Marland Kitchen Physically Abused:   . Sexually Abused:     Past Surgical History:  Procedure Laterality Date  . COLON SURGERY  2011   Partial colon removed due to diverticulitis  . COLONOSCOPY  08/2009  . NEPHRECTOMY Right   . PROSTATE BIOPSY     . RADIOACTIVE SEED IMPLANT N/A 08/03/2017   Procedure: RADIOACTIVE SEED IMPLANT/BRACHYTHERAPY IMPLANT;  Surgeon: Kathie Rhodes, MD;  Location: [facility];  Service: Urology;  Laterality: N/A;  . SPACE OAR INSTILLATION N/A 08/03/2017   Procedure: SPACE OAR INSTILLATION;  Surgeon: Kathie Rhodes, MD;  Location: [facility];  Service: Urology;  Laterality: N/A;    Family History  Problem Relation Age of Onset  . Heart disease Mother        heart failure - 65 and 73 when she passed   . Stroke Mother   . Cancer Father        wagners  . Glaucoma Father        wagoner's glaucoma  . Hypertension Other        multiple family members    Allergies  Allergen Reactions  .  Oxycodone Other (See Comments)    Patient stated he has nightmares with oxycodone    Current Outpatient Medications on File Prior to Visit  Medication Sig Dispense Refill  . allopurinol (ZYLOPRIM) 300 MG tablet TAKE 1 TABLET(300 MG) BY MOUTH DAILY 90 tablet 0  . amLODipine (NORVASC) 10 MG tablet TAKE 1 TABLET(10 MG) BY MOUTH DAILY 90 tablet 0  . aspirin 81 MG chewable tablet Chew by mouth.    . cetirizine (ZYRTEC) 10 MG tablet Take 10 mg by mouth daily.      . Cholecalciferol (VITAMIN D) 125 MCG (5000 UT) CAPS Take by mouth daily.    . Docusate Calcium ([employer] STOOL SOFTENER PO) Take by mouth daily.     . hydrochlorothiazide (HYDRODIURIL) 12.5 MG tablet TAKE 1 TABLET(12.5 MG) BY MOUTH DAILY 90 tablet 3  . losartan (COZAAR) 25 MG tablet TAKE 1 TABLET(25 MG) BY MOUTH DAILY 90 tablet 3  . metFORMIN (GLUCOPHAGE) 500 MG tablet TAKE 1 TABLET BY MOUTH BEFORE BREAKFAST 90 tablet 0  . Misc Natural Products (FIBER 7 PO) Take 2 tablets by mouth daily.     . Multiple Vitamins-Minerals (DAILY MULTI 50+ PO) Take 1 tablet by mouth daily.    . simvastatin (ZOCOR) 20 MG tablet TAKE 1 TABLET(20 MG) BY MOUTH DAILY 90 tablet 0  . tamsulosin (FLOMAX) 0.4 MG CAPS capsule Take 0.4 mg by mouth daily.     No current  facility-administered medications on file prior to visit.    BP 138/78   Temp 97.9 F (36.6 C)   Ht 5' 9.75" (1.772 m)   Wt 193 lb (87.5 kg)   BMI 27.89 kg/m       Objective:   Physical Exam Vitals and nursing note reviewed.  Constitutional:      General: He is not in acute distress.    Appearance: Normal appearance. He is well-developed and normal weight.  HENT:     Head: Normocephalic and atraumatic.     Right Ear: Tympanic membrane, ear canal and external ear normal. There is no impacted cerumen.     Left Ear: Tympanic membrane, ear canal and external ear normal. There is no impacted cerumen.     Nose: Nose normal. No congestion or rhinorrhea.     Mouth/Throat:     Mouth: Mucous membranes are moist.     Pharynx: Oropharynx is clear. No oropharyngeal exudate or posterior oropharyngeal erythema.  Eyes:     General:        Right eye: No discharge.        Left eye: No discharge.     Extraocular Movements: Extraocular movements intact.     Conjunctiva/sclera: Conjunctivae normal.     Pupils: Pupils are equal, round, and reactive to light.  Neck:     Vascular: No carotid bruit.     Trachea: No tracheal deviation.  Cardiovascular:     Rate and Rhythm: Normal rate and regular rhythm.     Pulses: Normal pulses.     Heart sounds: Normal heart sounds. No murmur heard.  No friction rub. No gallop.   Pulmonary:     Effort: Pulmonary effort is normal. No respiratory distress.     Breath sounds: Normal breath sounds. No stridor. No wheezing, rhonchi or rales.  Chest:     Chest wall: No tenderness.  Abdominal:     General: Bowel sounds are normal. There is no distension.     Palpations: Abdomen is soft. There is no mass.  Tenderness: There is no abdominal tenderness. There is no right CVA tenderness, left CVA tenderness, guarding or rebound.     Hernia: No hernia is present.  Musculoskeletal:        General: No swelling, tenderness, deformity or signs of injury. Normal  range of motion.     Right lower leg: No edema.     Left lower leg: No edema.  Lymphadenopathy:     Cervical: No cervical adenopathy.  Skin:    General: Skin is warm and dry.     Capillary Refill: Capillary refill takes less than 2 seconds.     Coloration: Skin is not jaundiced or pale.     Findings: No bruising, erythema, lesion or rash.  Neurological:     General: No focal deficit present.     Mental Status: He is alert and oriented to person, place, and time.     Cranial Nerves: No cranial nerve deficit.     Sensory: No sensory deficit.     Motor: No weakness.     Coordination: Coordination normal.     Gait: Gait normal.     Deep Tendon Reflexes: Reflexes normal.  Psychiatric:        Mood and Affect: Mood normal.        Behavior: Behavior normal.        Thought Content: Thought content normal.        Judgment: Judgment normal.       Assessment & Plan:  1. Routine general medical examination at a health care facility - Continue to stay active and exercise - Follow up in one year or sooner if needed - CBC with Differential/Platelet - Comprehensive metabolic panel - Hemoglobin A1c - Lipid panel - TSH  2. Diabetes 1.5, managed as type 2 ([place]) - Consider change of dose in Metformin  - Likely follow up in 6 months  - CBC with Differential/Platelet - Comprehensive metabolic panel - Hemoglobin A1c - Lipid panel - TSH  3. OSA (obstructive sleep apnea) - He will follow up with pulmonary   4. Hyperlipidemia, unspecified hyperlipidemia type - Continue with statin  - CBC with Differential/Platelet - Comprehensive metabolic panel - Hemoglobin A1c - Lipid panel - TSH  5. Essential hypertension - No change in medications  - CBC with Differential/Platelet - Comprehensive metabolic panel - Hemoglobin A1c - Lipid panel - TSH  6. Malignant neoplasm of prostate ([place]) - Follow up with Urology as directed  Dorothyann Peng, NP

## 2019-08-13 ENCOUNTER — Other Ambulatory Visit: Payer: Self-pay

## 2019-08-13 ENCOUNTER — Encounter: Payer: Self-pay | Admitting: Orthopaedic Surgery

## 2019-08-13 ENCOUNTER — Ambulatory Visit: Payer: Medicare Other | Admitting: Orthopaedic Surgery

## 2019-08-13 VITALS — Ht 69.75 in | Wt 193.0 lb

## 2019-08-13 DIAGNOSIS — M5441 Lumbago with sciatica, right side: Secondary | ICD-10-CM | POA: Diagnosis not present

## 2019-08-13 DIAGNOSIS — G8929 Other chronic pain: Secondary | ICD-10-CM | POA: Diagnosis not present

## 2019-08-13 DIAGNOSIS — M5442 Lumbago with sciatica, left side: Secondary | ICD-10-CM

## 2019-08-13 NOTE — Progress Notes (Signed)
Office Visit Note   Patient: Jacob Stone.           Date of Birth: November 24, 1950           MRN: [id number] Visit Date: 08/13/2019              Requested by: Dorothyann Peng, NP [place] [place],  [place] [address] PCP: Dorothyann Peng, NP   Assessment & Plan: Visit Diagnoses:  1. Chronic bilateral low back pain with bilateral sciatica     Plan: Mr. Allender relates that the epidural steroid injection performed about 5 weeks ago at [facility] imaging made a difference is not having nearly as much pain in his back or his legs.  He still little bit stiff getting up and having more back than leg pain.  I think at this point is worth trying a course of physical therapy and even a repeat epidural steroid injection.  We will make the referral for both and have him return in the next month or so  Follow-Up Instructions: Return in about 1 month (around 09/13/2019).   Orders:  Orders Placed This Encounter  Procedures   Epidural Steroid Injection - Lumbar/Sacral (Ancillary Performed)   Ambulatory referral to Physical Therapy   No orders of the defined types were placed in this encounter.     Procedures: No procedures performed   Clinical Data: No additional findings.   Subjective: Chief Complaint  Patient presents with   Lower Back - Follow-up  Patient presents today for follow up on his lower back. He had an ESI at [facility] on 07-09-2019. It has been 5 weeks since his injection. He states that the injection helped greatly for two weeks, but has since not helped as much. He is stiff upon getting up. Taking tylenol as needed.   HPI  Review of Systems   Objective: Vital Signs: Ht 5' 9.75" (1.772 m)    Wt 193 lb (87.5 kg)    BMI 27.89 kg/m   Physical Exam Constitutional:      Appearance: He is well-developed.  Eyes:     Pupils: Pupils are equal, round, and reactive to light.  Pulmonary:     Effort: Pulmonary effort is normal.  Skin:    General: Skin is  warm and dry.  Neurological:     Mental Status: He is alert and oriented to person, place, and time.  Psychiatric:        Behavior: Behavior normal.     Ortho Exam awake alert and oriented x3.  Comfortable sitting.  No pain with range of motion of either hip.  Straight leg raise negative motor exam intact.  No percussible tenderness of the lumbar spine.  No lateral hip pain or sacroiliac pain.  Specialty Comments:  No specialty comments available.  Imaging: No results found.   PMFS History: Patient Active Problem List   Diagnosis Date Noted   Low back pain 05/21/2019   Malignant neoplasm of prostate ([place]) 04/16/2017   Unilateral primary osteoarthritis, left knee 11/16/2016   OSA (obstructive sleep apnea) 04/18/2016   Sleep dysfunction with arousal disturbance 01/17/2016   Hyperlipidemia 09/14/2014   Diabetes 1.5, managed as type 2 ([place]) 07/07/2014   Carotid artery calcification 03/02/2011   Malignant neoplasm of kidney excluding renal pelvis ([place]) 11/14/2007   ALLERGIC RHINITIS 11/14/2007   DIVERTICULOSIS, COLON 04/16/2007   Essential hypertension 04/16/2007   Past Medical History:  Diagnosis Date   Carotid artery calcification 03/2011   MILD HARD  PLAQUE LEFT BULB, 0-39% BIL. ICA STENOSIS, PATENT VERTEBRAL ARTERIES WITH ANTEGRADE FLOW   Diabetes type 2, controlled ([place])    Diverticulitis    Fatty liver    FH: kidney cancer 2005   right kidney removed    GERD (gastroesophageal reflux disease)    Gout    History of kidney stones    Left kidney   HTN (hypertension)    Hyperlipidemia    OA (osteoarthritis)    Knee   OSA (obstructive sleep apnea) 04/18/2016   SEVERE; UNABLE TO WEAR CPAP MORE THAN 2-3 HOURS PER NIGHT   Plantar fasciitis    Prostate cancer ([facility])    Seasonal allergies     Family History  Problem Relation Age of Onset   Heart disease Mother        heart failure - 78 and 20 when she passed    Stroke Mother    Cancer  Father        wagners   Glaucoma Father        wagoner's glaucoma   Hypertension Other        multiple family members    Past Surgical History:  Procedure Laterality Date   COLON SURGERY  2011   Partial colon removed due to diverticulitis   COLONOSCOPY  08/2009   NEPHRECTOMY Right    PROSTATE BIOPSY     RADIOACTIVE SEED IMPLANT N/A 08/03/2017   Procedure: RADIOACTIVE SEED IMPLANT/BRACHYTHERAPY IMPLANT;  Surgeon: Kathie Rhodes, MD;  Location: [facility];  Service: Urology;  Laterality: N/A;   SPACE OAR INSTILLATION N/A 08/03/2017   Procedure: SPACE OAR INSTILLATION;  Surgeon: Kathie Rhodes, MD;  Location: [facility];  Service: Urology;  Laterality: N/A;   Social History   Occupational History   Occupation: retired    Comment: Physiological scientist   Occupation: part time    Comment: golf course  Tobacco Use   Smoking status: Never Smoker   Smokeless tobacco: Never Used  Scientific laboratory technician Use: Never used  Substance and Sexual Activity   Alcohol use: Yes    Alcohol/week: 1.0 standard drink    Types: 1 Cans of beer per week    Comment: rare   Drug use: No   Sexual activity: Yes

## 2019-08-20 ENCOUNTER — Ambulatory Visit
Admission: RE | Admit: 2019-08-20 | Discharge: 2019-08-20 | Disposition: A | Discharge status: Home / Self Care | Payer: Medicare Other | Source: Ambulatory Visit | Attending: Orthopaedic Surgery | Admitting: Orthopaedic Surgery

## 2019-08-20 ENCOUNTER — Other Ambulatory Visit: Payer: Self-pay

## 2019-08-20 DIAGNOSIS — G8929 Other chronic pain: Secondary | ICD-10-CM

## 2019-08-20 MED ORDER — METHYLPREDNISOLONE ACETATE 40 MG/ML INJ SUSP (RADIOLOG: 120.0000 mg | Freq: Once | INTRAMUSCULAR | Status: DC

## 2019-08-20 MED ORDER — IOPAMIDOL (ISOVUE-M 200) INJECTION 41%: 1.0000 mL | Freq: Once | INTRAMUSCULAR | Status: DC

## 2019-08-20 NOTE — Discharge Instructions (Signed)

## 2019-09-20 ENCOUNTER — Other Ambulatory Visit: Payer: Self-pay | Admitting: Adult Health

## 2019-09-23 ENCOUNTER — Other Ambulatory Visit: Payer: Self-pay | Admitting: Adult Health

## 2019-09-23 NOTE — Telephone Encounter (Signed)
30 day supply sent to the pharmacy by e-scribe.  Pt is due for cpx.

## 2019-09-25 ENCOUNTER — Ambulatory Visit: Payer: Medicare Other | Attending: Orthopaedic Surgery | Admitting: Physical Therapy

## 2019-09-25 ENCOUNTER — Encounter: Payer: Self-pay | Admitting: Physical Therapy

## 2019-09-25 ENCOUNTER — Other Ambulatory Visit: Payer: Self-pay

## 2019-09-25 DIAGNOSIS — R293 Abnormal posture: Secondary | ICD-10-CM | POA: Diagnosis present

## 2019-09-25 DIAGNOSIS — M5441 Lumbago with sciatica, right side: Secondary | ICD-10-CM | POA: Diagnosis present

## 2019-09-25 DIAGNOSIS — G8929 Other chronic pain: Secondary | ICD-10-CM | POA: Diagnosis present

## 2019-09-25 DIAGNOSIS — M5442 Lumbago with sciatica, left side: Secondary | ICD-10-CM | POA: Insufficient documentation

## 2019-09-25 NOTE — Therapy (Signed)
[facility] [address] [place], [place], [address] Phone: [phone]   Fax:  [phone]  Physical Therapy Evaluation  Patient Details  Name: Jacob Stone. MRN: [id number] Date of Birth: 03-17-1950 Referring Provider (PT): Joni Fears, MD   Encounter Date: 09/25/2019   PT End of Session - 09/25/19 1540    Visit Number 1    Date for PT Re-Evaluation 11/20/19    Authorization Type 09/25/19 to 11/20/19    PT Start Time 1446    PT Stop Time 1528    PT Time Calculation (min) 42 min    Activity Tolerance No increased pain;Patient tolerated treatment well    Behavior During Therapy [facility] for tasks assessed/performed           Past Medical History:  Diagnosis Date  . Carotid artery calcification 03/2011   MILD HARD PLAQUE LEFT BULB, 0-39% BIL. ICA STENOSIS, PATENT VERTEBRAL ARTERIES WITH ANTEGRADE FLOW  . Diabetes type 2, controlled ([place])   . Diverticulitis   . Fatty liver   . FH: kidney cancer 2005   right kidney removed   . GERD (gastroesophageal reflux disease)   . Gout   . History of kidney stones    Left kidney  . HTN (hypertension)   . Hyperlipidemia   . OA (osteoarthritis)    Knee  . OSA (obstructive sleep apnea) 04/18/2016   SEVERE; UNABLE TO WEAR CPAP MORE THAN 2-3 HOURS PER NIGHT  . Plantar fasciitis   . Prostate cancer ([place])   . Seasonal allergies     Past Surgical History:  Procedure Laterality Date  . COLON SURGERY  2011   Partial colon removed due to diverticulitis  . COLONOSCOPY  08/2009  . NEPHRECTOMY Right   . PROSTATE BIOPSY    . RADIOACTIVE SEED IMPLANT N/A 08/03/2017   Procedure: RADIOACTIVE SEED IMPLANT/BRACHYTHERAPY IMPLANT;  Surgeon: Kathie Rhodes, MD;  Location: [facility];  Service: Urology;  Laterality: N/A;  . SPACE OAR INSTILLATION N/A 08/03/2017   Procedure: SPACE OAR INSTILLATION;  Surgeon: Kathie Rhodes, MD;  Location: [facility];   Service: Urology;  Laterality: N/A;    There were no vitals filed for this visit.    Subjective Assessment - 09/25/19 1449    Subjective Pt states he began having Lt hip pain 26months ago. He was given a sterroid shot that lasted for 2 days. He has since had varied pain between the Lt and Rt hip and both. He is having trouble with sleeping because of the pain. He had an MRI back in May and had 2 injections in the low back. This seemed to help alot, but he has had to be careful with his activity. He is usally fine when sitting, but any bending over causes increase in his pain. Recently, pain and stiffness has been worse in the mornings. Otherwise, his pain is worse with bending/lifting.    Patient Stated Goals improve pain with daily activity    Currently in Pain? No/denies              [facility] PT Assessment - 09/25/19 0001      Assessment   Medical Diagnosis chronic low back pain with sciatica     Referring Provider (PT) Joni Fears, MD    Onset Date/Surgical Date --   6 months    Prior Therapy none       Precautions   Precautions None      Restrictions  Weight Bearing Restrictions No      Balance Screen   Has the patient fallen in the past 6 months No    Has the patient had a decrease in activity level because of a fear of falling?  No    Is the patient reluctant to leave their home because of a fear of falling?  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[employer], maintenance 3 days a week     Leisure plays golf some       Cognition   Overall Cognitive Status Within Functional Limits for tasks assessed      Sensation   Additional Comments denies numbness/tingling       ROM / Strength   AROM / PROM / Strength AROM;Strength      AROM   Overall AROM Comments limited all directions, painful with lumbar flexion increasing after 10 reps        Strength   Overall Strength Comments BLE 5/5 MMT       Palpation   Spinal mobility hypomobile lumbar and thoracic central PAs       Special Tests   Other special tests (+) passive SLR on Rt 45 deg                       Objective measurements completed on examination: See above findings.       [facility] Adult PT Treatment/Exercise - 09/25/19 0001      Exercises   Exercises Lumbar      Lumbar Exercises: Stretches   Piriformis Stretch Right;1 rep;10 seconds    Piriformis Stretch Limitations seated knee to opposite shoulder     Figure 4 Stretch 1 rep;10 seconds    Figure 4 Stretch Limitations seated     Other Lumbar Stretch Exercise thoracic rotation x5 reps each direction for HEP    Other Lumbar Stretch Exercise Rt sciatic nerve glide 90/90 x8 reps                  PT Education - 09/25/19 1539    Education Details eval findings/POC; implemented HEP    Person(s) Educated Patient    Methods Explanation;Verbal cues;Handout    Comprehension Verbalized understanding;Returned demonstration            PT Short Term Goals - 09/25/19 1516      PT SHORT TERM GOAL #1   Title Pt will be independent with his intitial HEP.    Time 3    Period Weeks    Status New    Target Date 10/16/19             PT Long Term Goals - 09/25/19 1642      PT LONG TERM GOAL #1   Title Pt will be able to complete sit to stand transition throughout the day without pain or the need for UE support.    Time 8    Period Weeks    Status New      PT LONG TERM GOAL #2   Title Pt will be able to properly lift a 15# box from the floor 2/3 trials without the need for PT cuing or increase in pain.    Time 8    Period Weeks    Status New      PT LONG  TERM GOAL #3   Title Pt will report atleast 60% improvement in his pain from the start of PT.    Time 8    Period Weeks    Status New      PT LONG TERM GOAL #4   Title Pt will have good understanding of his advanced HEP to allow  him to continue making progress towards improving flexibility and strength following discharge.    Time 8    Period Weeks    Status New                  Plan - 09/25/19 1545    Clinical Impression Statement Pt is a 69 y.o M presents to OPPT with complaints of bilateral hip/buttock pain onset over 6 months ago. He was referred with low back pain with sciatica symptoms. Pt has pain with bending over, flexion and lifting heavy objects. He has limited active lumbar ROM with worsening pain into flexion. Pt has neural tension more on the Rt with passive straight leg raise. Pt has restricted hip mobility bilaterally and thoracic spine hypomobility contributing to his pain with sit to stand transitions and other daily activity. He would benefit from skilled PT to educate on safe/proper lifting technique and address his limitations in LE/trunk flexibility and core strength to decrease pain throughout the day.    Personal Factors and Comorbidities Age;Time since onset of injury/illness/exacerbation    Examination-Activity Limitations Transfers    Examination-Participation Restrictions Yard Work    Stability/Clinical Decision Making Stable/Uncomplicated    Clinical Decision Making Low    Rehab Potential Good    PT Frequency 1x / week    PT Duration 8 weeks    PT Treatment/Interventions ADLs/Self Care Home Management;Electrical Stimulation;Moist Heat;Therapeutic activities;Functional mobility training;Neuromuscular re-education;Therapeutic exercise;Patient/family education;Manual techniques;Passive range of motion;Taping;Dry needling;Spinal Manipulations    PT Next Visit Plan possible d/n lumbar multifidi if pt agreeable; lifting mechanics; hip flexibility; thoracic and lumbar flexibility    PT Home Exercise Plan [email or web address]    Consulted and Agree with Plan of Care Patient           Patient will benefit from skilled therapeutic intervention in order to improve the following deficits and  impairments:  Decreased range of motion, Decreased strength, Impaired UE functional use, Pain, Postural dysfunction, Impaired flexibility, Increased muscle spasms, Decreased mobility  Visit Diagnosis: Chronic bilateral low back pain with bilateral sciatica  Abnormal posture     Problem List Patient Active Problem List   Diagnosis Date Noted  . Low back pain 05/21/2019  . Malignant neoplasm of prostate ([place]) 04/16/2017  . Unilateral primary osteoarthritis, left knee 11/16/2016  . OSA (obstructive sleep apnea) 04/18/2016  . Sleep dysfunction with arousal disturbance 01/17/2016  . Hyperlipidemia 09/14/2014  . Diabetes 1.5, managed as type 2 ([place]) 07/07/2014  . Carotid artery calcification 03/02/2011  . Malignant neoplasm of kidney excluding renal pelvis ([place]) 11/14/2007  . ALLERGIC RHINITIS 11/14/2007  . DIVERTICULOSIS, COLON 04/16/2007  . Essential hypertension 04/16/2007    4:46 PM,09/25/19 Sherol Dade PT, DPT [place] [facility] [place]  [facility] [address], [place], [place], [address] Phone: [phone]   Fax:  [phone]  Name: Jacob Stone. MRN: [id number] Date of Birth: 03-23-1950

## 2019-09-25 NOTE — Patient Instructions (Signed)

## 2019-10-01 ENCOUNTER — Ambulatory Visit: Payer: Medicare Other

## 2019-10-01 ENCOUNTER — Other Ambulatory Visit: Payer: Self-pay

## 2019-10-01 DIAGNOSIS — M5442 Lumbago with sciatica, left side: Secondary | ICD-10-CM | POA: Diagnosis not present

## 2019-10-01 DIAGNOSIS — M5441 Lumbago with sciatica, right side: Secondary | ICD-10-CM

## 2019-10-01 DIAGNOSIS — G8929 Other chronic pain: Secondary | ICD-10-CM

## 2019-10-01 DIAGNOSIS — R293 Abnormal posture: Secondary | ICD-10-CM

## 2019-10-01 NOTE — Therapy (Signed)
[facility] [address], [place], [place], [address] Phone: [phone]   Fax:  [phone]  Physical Therapy Treatment  Patient Details  Name: Jacob Stone. MRN: [id number] Date of Birth: [birth year] Referring Provider (PT): Joni Fears, MD   Encounter Date: 10/01/2019   PT End of Session - 10/01/19 1604    Visit Number 2    Date for PT Re-Evaluation 11/20/19    PT Start Time 2426    PT Stop Time 1606    PT Time Calculation (min) 35 min    Activity Tolerance No increased pain;Patient tolerated treatment well    Behavior During Therapy [facility] for tasks assessed/performed           Past Medical History:  Diagnosis Date  . Carotid artery calcification 03/2011   MILD HARD PLAQUE LEFT BULB, 0-39% BIL. ICA STENOSIS, PATENT VERTEBRAL ARTERIES WITH ANTEGRADE FLOW  . Diabetes type 2, controlled ([place])   . Diverticulitis   . Fatty liver   . FH: kidney cancer 2005   right kidney removed   . GERD (gastroesophageal reflux disease)   . Gout   . History of kidney stones    Left kidney  . HTN (hypertension)   . Hyperlipidemia   . OA (osteoarthritis)    Knee  . OSA (obstructive sleep apnea) 04/18/2016   SEVERE; UNABLE TO WEAR CPAP MORE THAN 2-3 HOURS PER NIGHT  . Plantar fasciitis   . Prostate cancer ([place])   . Seasonal allergies     Past Surgical History:  Procedure Laterality Date  . COLON SURGERY  2011   Partial colon removed due to diverticulitis  . COLONOSCOPY  08/2009  . NEPHRECTOMY Right   . PROSTATE BIOPSY    . RADIOACTIVE SEED IMPLANT N/A 08/03/2017   Procedure: RADIOACTIVE SEED IMPLANT/BRACHYTHERAPY IMPLANT;  Surgeon: Kathie Rhodes, MD;  Location: [facility];  Service: Urology;  Laterality: N/A;  . SPACE OAR INSTILLATION N/A 08/03/2017   Procedure: SPACE OAR INSTILLATION;  Surgeon: Kathie Rhodes, MD;  Location: [facility]  Service: Urology;  Laterality: N/A;     There were no vitals filed for this visit.   Subjective Assessment - 10/01/19 1534    Subjective I'm still stiff when I get up in the mornings.    Patient Stated Goals improve pain with daily activity    Currently in Pain? Yes    Pain Score 2     Pain Location Back    Pain Orientation Right    Pain Descriptors / Indicators Aching    Pain Type Chronic pain    Pain Onset More than a month ago    Aggravating Factors  twisting to reach for something, gets stiff with getting up from a supine positions    Pain Relieving Factors heat                             [facility] Adult PT Treatment/Exercise - 10/01/19 0001      Lumbar Exercises: Stretches   Active Hamstring Stretch 3 reps;20 seconds    Piriformis Stretch Right;2 reps;20 seconds    Piriformis Stretch Limitations seated knee to opposite shoulder     Figure 4 Stretch 2 reps;20 seconds    Figure 4 Stretch Limitations seated     Other Lumbar Stretch Exercise thoracic rotation x5 reps each direction for HEP    Other Lumbar Stretch Exercise Rt sciatic nerve glide  90/90 x8 reps      Lumbar Exercises: Supine   Ab Set 10 reps;5 seconds      Lumbar Exercises: Sidelying   Clam Both;10 reps    Clam Limitations TA activation                  PT Education - 10/01/19 1546    Education Details Access Code: [email or web address]    Person(s) Educated Patient    Methods Explanation;Demonstration;Handout    Comprehension Verbalized understanding;Returned demonstration            PT Short Term Goals - 09/25/19 1516      PT SHORT TERM GOAL #1   Title Pt will be independent with his intitial HEP.    Time 3    Period Weeks    Status New    Target Date 10/16/19             PT Long Term Goals - 09/25/19 1642      PT LONG TERM GOAL #1   Title Pt will be able to complete sit to stand transition throughout the day without pain or the need for UE support.    Time 8    Period Weeks    Status New      PT LONG TERM  GOAL #2   Title Pt will be able to properly lift a 15# box from the floor 2/3 trials without the need for PT cuing or increase in pain.    Time 8    Period Weeks    Status New      PT LONG TERM GOAL #3   Title Pt will report atleast 60% improvement in his pain from the start of PT.    Time 8    Period Weeks    Status New      PT LONG TERM GOAL #4   Title Pt will have good understanding of his advanced HEP to allow him to continue making progress towards improving flexibility and strength following discharge.    Time 8    Period Weeks    Status New                 Plan - 10/01/19 1554    Clinical Impression Statement Pt with first time follow-up after evaluation.  Pt is independent and compliant with HEP and was able to perform all exercises with good form today.  PT added sidelying clam for core and hip strength, TA activation and hamstring flexibility to HEP.  Pt required minor cueing for abdominal activation due to fair control of TA.  Pt will continue to benefit from skilled PT to address flexibility, strength and tissue mobility.  Pt will consider DN next week.    PT Frequency 1x / week    PT Duration 8 weeks    PT Treatment/Interventions ADLs/Self Care Home Management;Electrical Stimulation;Moist Heat;Therapeutic activities;Functional mobility training;Neuromuscular re-education;Therapeutic exercise;Patient/family education;Manual techniques;Passive range of motion;Taping;Dry needling;Spinal Manipulations    PT Next Visit Plan Pt will consider DN for next week, lifting/mechanics, review new HEP    PT Home Exercise Plan [email or web address]    Recommended Other Services initial cert is signed    Consulted and Agree with Plan of Care Patient           Patient will benefit from skilled therapeutic intervention in order to improve the following deficits and impairments:  Decreased range of motion, Decreased strength, Impaired UE functional use, Pain, Postural dysfunction, Impaired  flexibility, Increased muscle spasms, Decreased mobility  Visit Diagnosis: Abnormal posture  Chronic bilateral low back pain with bilateral sciatica     Problem List Patient Active Problem List   Diagnosis Date Noted  . Low back pain 05/21/2019  . Malignant neoplasm of prostate ([place]) 04/16/2017  . Unilateral primary osteoarthritis, left knee 11/16/2016  . OSA (obstructive sleep apnea) 04/18/2016  . Sleep dysfunction with arousal disturbance 01/17/2016  . Hyperlipidemia 09/14/2014  . Diabetes 1.5, managed as type 2 ([place]) 07/07/2014  . Carotid artery calcification 03/02/2011  . Malignant neoplasm of kidney excluding renal pelvis ([place]) 11/14/2007  . ALLERGIC RHINITIS 11/14/2007  . DIVERTICULOSIS, COLON 04/16/2007  . Essential hypertension 04/16/2007     Sigurd Sos, PT 10/01/19 4:10 PM  [facility] [address], [place], [place], [address] Phone: [phone]   Fax:  [phone]  Name: Luigi Stuckey. MRN: [id number] Date of Birth: 1950/06/09

## 2019-10-01 NOTE — Patient Instructions (Addendum)
  Access Code: [email or web address] URL: [email or web address] Date: 10/01/2019 Prepared by: Claiborne Billings  Exercises Seated Piriformis Stretch - 3 x daily - 7 x weekly - 1 sets - 3 reps - 20 hold Seated Figure 4 Piriformis Stretch - 2 x daily - 7 x weekly - 1 sets - 3 reps - 20 hold Supine 90/90 Sciatic Nerve Glide with Knee Flexion/Extension - 2 x daily - 7 x weekly - 1 sets - 10 reps - 5 hold Sidelying Open Book Thoracic Rotation with Knee on Foam Roll - 2 x daily - 7 x weekly - 1 sets - 10 reps - 5 hold Clamshell - 2 x daily - 7 x weekly - 2 sets - 10 reps Seated Hamstring Stretch - 2 x daily - 7 x weekly - 1 sets - 3 reps - 20 hold Supine Transversus Abdominis Bracing - Hands on Stomach - 2 x daily - 7 x weekly - 1 sets - 5 reps - 5 hold Seated Transversus Abdominis Bracing - 2 x daily - 7 x weekly - 1 sets - 5 reps - 5 hold

## 2019-10-08 ENCOUNTER — Ambulatory Visit: Payer: Medicare Other | Attending: Orthopaedic Surgery | Admitting: Physical Therapy

## 2019-10-08 ENCOUNTER — Encounter: Payer: Self-pay | Admitting: Physical Therapy

## 2019-10-08 ENCOUNTER — Other Ambulatory Visit: Payer: Self-pay

## 2019-10-08 ENCOUNTER — Other Ambulatory Visit: Payer: Self-pay | Admitting: Adult Health

## 2019-10-08 DIAGNOSIS — M5442 Lumbago with sciatica, left side: Secondary | ICD-10-CM | POA: Insufficient documentation

## 2019-10-08 DIAGNOSIS — G8929 Other chronic pain: Secondary | ICD-10-CM

## 2019-10-08 DIAGNOSIS — M5441 Lumbago with sciatica, right side: Secondary | ICD-10-CM | POA: Insufficient documentation

## 2019-10-08 DIAGNOSIS — R293 Abnormal posture: Secondary | ICD-10-CM | POA: Diagnosis present

## 2019-10-08 NOTE — Therapy (Signed)
[facility] [address] [place], [place], [address] Phone: [phone]   Fax:  [phone]  Physical Therapy Treatment  Patient Details  Name: Jacob Stone. MRN: [id number] Date of Birth: [birth year] Referring Provider (PT): Joni Fears, MD   Encounter Date: 10/08/2019   PT End of Session - 10/08/19 1548    Visit Number 3    Date for PT Re-Evaluation 11/20/19    Authorization Type 09/25/19 to 11/20/19    PT Start Time 1534    PT Stop Time 1612    PT Time Calculation (min) 38 min    Activity Tolerance No increased pain;Patient tolerated treatment well    Behavior During Therapy [facility] for tasks assessed/performed           Past Medical History:  Diagnosis Date  . Carotid artery calcification 03/2011   MILD HARD PLAQUE LEFT BULB, 0-39% BIL. ICA STENOSIS, PATENT VERTEBRAL ARTERIES WITH ANTEGRADE FLOW  . Diabetes type 2, controlled ([place])   . Diverticulitis   . Fatty liver   . FH: kidney cancer 2005   right kidney removed   . GERD (gastroesophageal reflux disease)   . Gout   . History of kidney stones    Left kidney  . HTN (hypertension)   . Hyperlipidemia   . OA (osteoarthritis)    Knee  . OSA (obstructive sleep apnea) 04/18/2016   SEVERE; UNABLE TO WEAR CPAP MORE THAN 2-3 HOURS PER NIGHT  . Plantar fasciitis   . Prostate cancer ([place])   . Seasonal allergies     Past Surgical History:  Procedure Laterality Date  . COLON SURGERY  2011   Partial colon removed due to diverticulitis  . COLONOSCOPY  08/2009  . NEPHRECTOMY Right   . PROSTATE BIOPSY    . RADIOACTIVE SEED IMPLANT N/A 08/03/2017   Procedure: RADIOACTIVE SEED IMPLANT/BRACHYTHERAPY IMPLANT;  Surgeon: Kathie Rhodes, MD;  Location: [facility];  Service: Urology;  Laterality: N/A;  . SPACE OAR INSTILLATION N/A 08/03/2017   Procedure: SPACE OAR INSTILLATION;  Surgeon: Kathie Rhodes, MD;  Location: [facility];   Service: Urology;  Laterality: N/A;    There were no vitals filed for this visit.   Subjective Assessment - 10/08/19 1540    Subjective Pt reports he only had 4 hrs of sleep last night; couldn't get comfortable due to pain.  No other changes.    Patient Stated Goals improve pain with daily activity    Currently in Pain? No/denies    Pain Onset More than a month ago              [facility] PT Assessment - 10/08/19 0001      Assessment   Medical Diagnosis chronic low back pain with sciatica     Referring Provider (PT) Joni Fears, MD    Onset Date/Surgical Date --   6 months    Prior Therapy none              [facility] Adult PT Treatment/Exercise - 10/08/19 0001      Lumbar Exercises: Stretches   Hip Flexor Stretch Right;Left;2 reps;30 seconds;60 seconds   seated with leg back, 2nd rep with arm overhead   Standing Extension 2 reps;5 reps    Prone on Elbows Stretch 2 reps;20 seconds    Piriformis Stretch Right;Left;2 reps;30 seconds    Other Lumbar Stretch Exercise lower trunk rotation with knees wide x 4 reps of 10 sec holds,  then  QL stretch with legs crossed, with LTR x 20 sec x 2 reps eahc side      Lumbar Exercises: Aerobic   Nustep L3-4: 4 min for warm up.       Manual Therapy   Manual Therapy Soft tissue mobilization    Manual therapy comments assessment of tissue for dry needling    Soft tissue mobilization elongation of tissue after dry needling to left lumbar and gluteals            Trigger Point Dry Needling - 10/08/19 0001    Consent Given? Yes    Education Handout Provided Yes    Muscles Treated Back/Hip Lumbar multifidi;Quadratus lumborum;Gluteus maximus;Gluteus medius   Lt   Gluteus Medius Response Twitch response elicited;Palpable increased muscle length    Gluteus Maximus Response Twitch response elicited;Palpable increased muscle length    Lumbar multifidi Response Twitch response elicited;Palpable increased muscle length    Quadratus Lumborum  Response Twitch response elicited;Palpable increased muscle length                PT Education - 10/08/19 1555    Education Details HEP( hip flexor stretch); DN info    Person(s) Educated Patient    Methods Explanation;Handout;Demonstration    Comprehension Verbalized understanding;Verbal cues required            PT Short Term Goals - 09/25/19 1516      PT SHORT TERM GOAL #1   Title Pt will be independent with his intitial HEP.    Time 3    Period Weeks    Status New    Target Date 10/16/19             PT Long Term Goals - 09/25/19 1642      PT LONG TERM GOAL #1   Title Pt will be able to complete sit to stand transition throughout the day without pain or the need for UE support.    Time 8    Period Weeks    Status New      PT LONG TERM GOAL #2   Title Pt will be able to properly lift a 15# box from the floor 2/3 trials without the need for PT cuing or increase in pain.    Time 8    Period Weeks    Status New      PT LONG TERM GOAL #3   Title Pt will report atleast 60% improvement in his pain from the start of PT.    Time 8    Period Weeks    Status New      PT LONG TERM GOAL #4   Title Pt will have good understanding of his advanced HEP to allow him to continue making progress towards improving flexibility and strength following discharge.    Time 8    Period Weeks    Status New                 Plan - 10/08/19 1549    Clinical Impression Statement Pt reported increased tightness in Lt hip (psoas, QL) with stretches today.  He tolerated all exercises well, without increase in pain.  DN provided by supervising PT, Earlie Counts.  Positive twitch response reported with palpable lengthening of tissue in Lt lower back and glute.  Pt would benefit from additional session with manual / DN to psoas, low back and hip. Progressing gradually towards goals.    PT Frequency 1x / week    PT Duration 8  weeks    PT Treatment/Interventions ADLs/Self Care Home  Management;Electrical Stimulation;Moist Heat;Therapeutic activities;Functional mobility training;Neuromuscular re-education;Therapeutic exercise;Patient/family education;Manual techniques;Passive range of motion;Taping;Dry needling;Spinal Manipulations    PT Next Visit Plan lifting/mechanics, review new HEP.    PT Home Exercise Plan [email or web address]    Consulted and Agree with Plan of Care Patient           Patient will benefit from skilled therapeutic intervention in order to improve the following deficits and impairments:  Decreased range of motion, Decreased strength, Impaired UE functional use, Pain, Postural dysfunction, Impaired flexibility, Increased muscle spasms, Decreased mobility  Visit Diagnosis: Abnormal posture  Chronic bilateral low back pain with bilateral sciatica     Problem List Patient Active Problem List   Diagnosis Date Noted  . Low back pain 05/21/2019  . Malignant neoplasm of prostate ([place]) 04/16/2017  . Unilateral primary osteoarthritis, left knee 11/16/2016  . OSA (obstructive sleep apnea) 04/18/2016  . Sleep dysfunction with arousal disturbance 01/17/2016  . Hyperlipidemia 09/14/2014  . Diabetes 1.5, managed as type 2 ([place]) 07/07/2014  . Carotid artery calcification 03/02/2011  . Malignant neoplasm of kidney excluding renal pelvis ([place]) 11/14/2007  . ALLERGIC RHINITIS 11/14/2007  . DIVERTICULOSIS, COLON 04/16/2007  . Essential hypertension 04/16/2007   Kerin Perna, PTA 10/08/19 5:43 PM Earlie Counts, PT 10/08/19 5:43 PM   [facility] [address] [place], [place], [address] Phone: [phone]   Fax:  [phone]  Name: Jacob Stone. MRN: [id number] Date of Birth: [birth year]

## 2019-10-08 NOTE — Patient Instructions (Addendum)

## 2019-10-09 ENCOUNTER — Encounter: Payer: Medicare Other | Admitting: Physical Therapy

## 2019-10-16 ENCOUNTER — Encounter: Payer: Medicare Other | Admitting: Physical Therapy

## 2019-10-18 ENCOUNTER — Other Ambulatory Visit: Payer: Self-pay | Admitting: Adult Health

## 2019-10-18 DIAGNOSIS — E785 Hyperlipidemia, unspecified: Secondary | ICD-10-CM

## 2019-10-18 DIAGNOSIS — I10 Essential (primary) hypertension: Secondary | ICD-10-CM

## 2019-10-21 ENCOUNTER — Other Ambulatory Visit: Payer: Self-pay | Admitting: Adult Health

## 2019-10-24 ENCOUNTER — Other Ambulatory Visit: Payer: Self-pay | Admitting: Adult Health

## 2019-10-24 DIAGNOSIS — I10 Essential (primary) hypertension: Secondary | ICD-10-CM

## 2019-10-24 DIAGNOSIS — E785 Hyperlipidemia, unspecified: Secondary | ICD-10-CM

## 2019-10-29 ENCOUNTER — Ambulatory Visit: Payer: Medicare Other | Admitting: Physical Therapy

## 2019-10-29 ENCOUNTER — Encounter: Payer: Self-pay | Admitting: Physical Therapy

## 2019-10-29 ENCOUNTER — Other Ambulatory Visit: Payer: Self-pay

## 2019-10-29 DIAGNOSIS — G8929 Other chronic pain: Secondary | ICD-10-CM

## 2019-10-29 DIAGNOSIS — R293 Abnormal posture: Secondary | ICD-10-CM | POA: Diagnosis not present

## 2019-10-29 DIAGNOSIS — M5442 Lumbago with sciatica, left side: Secondary | ICD-10-CM

## 2019-10-29 NOTE — Therapy (Signed)
[facility] [address], [place], [place], [address] Phone: [phone]   Fax:  [phone]  Physical Therapy Treatment  Patient Details  Name: Jacob Stone. MRN: [id number] Date of Birth: 1950-02-15 Referring Provider (PT): Joni Fears, MD   Encounter Date: 10/29/2019   PT End of Session - 10/29/19 1456    Visit Number 4    Date for PT Re-Evaluation 11/20/19    Authorization Type 09/25/19 to 11/20/19    PT Start Time 1455    PT Stop Time 1540    PT Time Calculation (min) 45 min    Activity Tolerance No increased pain;Patient tolerated treatment well    Behavior During Therapy [facility] for tasks assessed/performed           Past Medical History:  Diagnosis Date   Carotid artery calcification 03/2011   MILD HARD PLAQUE LEFT BULB, 0-39% BIL. ICA STENOSIS, PATENT VERTEBRAL ARTERIES WITH ANTEGRADE FLOW   Diabetes type 2, controlled ([place])    Diverticulitis    Fatty liver    FH: kidney cancer 2005   right kidney removed    GERD (gastroesophageal reflux disease)    Gout    History of kidney stones    Left kidney   HTN (hypertension)    Hyperlipidemia    OA (osteoarthritis)    Knee   OSA (obstructive sleep apnea) 04/18/2016   SEVERE; UNABLE TO WEAR CPAP MORE THAN 2-3 HOURS PER NIGHT   Plantar fasciitis    Prostate cancer ([place])    Seasonal allergies     Past Surgical History:  Procedure Laterality Date   COLON SURGERY  2011   Partial colon removed due to diverticulitis   COLONOSCOPY  08/2009   NEPHRECTOMY Right    PROSTATE BIOPSY     RADIOACTIVE SEED IMPLANT N/A 08/03/2017   Procedure: RADIOACTIVE SEED IMPLANT/BRACHYTHERAPY IMPLANT;  Surgeon: Kathie Rhodes, MD;  Location: [employer] [facility];  Service: Urology;  Laterality: N/A;   SPACE OAR INSTILLATION N/A 08/03/2017   Procedure: SPACE OAR INSTILLATION;  Surgeon: Kathie Rhodes, MD;  Location[facility];   Service: Urology;  Laterality: N/A;    There were no vitals filed for this visit.   Subjective Assessment - 10/29/19 1456    Subjective Getting up from sitting or moving left 3-4/10 in hip again. It was really good after last visit until two days ago. He was moving 40# bags of fertilizer.    Patient Stated Goals improve pain with daily activity    Currently in Pain? Yes    Pain Score 4     Pain Location Hip    Pain Orientation Left    Pain Descriptors / Indicators Aching    Pain Type Chronic pain                             [facility] Adult PT Treatment/Exercise - 10/29/19 0001      Self-Care   Self-Care ADL's    ADL's sit<>supine and sit<> strand transfers; squat position for lifting      Lumbar Exercises: Stretches   Single Knee to Chest Stretch 2 reps;Left    Single Knee to Chest Stretch Limitations cross body for hip stretch      Lumbar Exercises: Aerobic   Nustep L3-4: 5 min for warm up.       Lumbar Exercises: Supine   Ab Set 5 reps    AB  Set Limitations ab set in table top with ball hands pressing down toward knees 5x5 sec    Bent Knee Raise 10 reps    Bent Knee Raise Limitations then 10 reps up/up/down/down    Bridge 10 reps      Lumbar Exercises: Prone   Other Prone Lumbar Exercises press ups x 10      Manual Therapy   Manual Therapy Soft tissue mobilization    Manual therapy comments assessment of tissue for dry needling    Soft tissue mobilization elongation of tissue after dry needling to left lumbar and gluteals            Trigger Point Dry Needling - 10/29/19 0001    Consent Given? Yes    Education Handout Provided Previously provided    Muscles Treated Back/Hip Gluteus minimus;Gluteus medius;Gluteus maximus;Lumbar multifidi    Dry Needling Comments left    Gluteus Minimus Response Twitch response elicited;Palpable increased muscle length    Gluteus Medius Response Twitch response elicited;Palpable increased muscle length    Gluteus  Maximus Response Twitch response elicited;Palpable increased muscle length    Lumbar multifidi Response Twitch response elicited                PT Education - 10/29/19 1542    Education Details HEP progressed; educated on [place] using ball to hip    Person(s) Educated Patient    Methods Demonstration;Explanation;Handout    Comprehension Verbalized understanding;Returned demonstration            PT Short Term Goals - 10/29/19 1458      PT SHORT TERM GOAL #1   Title Pt will be independent with his intitial HEP.    Baseline doing every day    Status Achieved             PT Long Term Goals - 10/29/19 1459      PT LONG TERM GOAL #1   Title Pt will be able to complete sit to stand transition throughout the day without pain or the need for UE support.    Status On-going      PT LONG TERM GOAL #2   Title --      PT LONG TERM GOAL #3   Title Pt will report atleast 60% improvement in his pain from the start of PT.    Baseline 50-60% better since flare up; prior to that 80%    Status On-going      PT LONG TERM GOAL #4   Title Pt will have good understanding of his advanced HEP to allow him to continue making progress towards improving flexibility and strength following discharge.    Status Partially Met                 Plan - 10/29/19 1459    Clinical Impression Statement Patient presents today with c/o pain relief until two days ago when he was lifting 40# fertilzer bags. Prior to that he reports he was 80% better; now 50-60%. Pain does not wake patient at night anymore. He had increased tension in gluteals today which was released with DN/manual therapy. We progressed his core strengthening and worked on transfers to protect low back.    Personal Factors and Comorbidities Age;Time since onset of injury/illness/exacerbation    Examination-Activity Limitations Transfers    Examination-Participation Restrictions Yard Work    PT Frequency 1x / week    PT Duration 8  weeks    PT Treatment/Interventions ADLs/Self Care Home Management;Electrical Stimulation;Moist  Heat;Therapeutic activities;Functional mobility training;Neuromuscular re-education;Therapeutic exercise;Patient/family education;Manual techniques;Passive range of motion;Taping;Dry needling;Spinal Manipulations    PT Next Visit Plan lifting/mechanics, review new HEP.    PT Home Exercise Plan [email or web address]    Consulted and Agree with Plan of Care Patient           Patient will benefit from skilled therapeutic intervention in order to improve the following deficits and impairments:  Decreased range of motion, Decreased strength, Impaired UE functional use, Pain, Postural dysfunction, Impaired flexibility, Increased muscle spasms, Decreased mobility  Visit Diagnosis: Abnormal posture  Chronic bilateral low back pain with bilateral sciatica     Problem List Patient Active Problem List   Diagnosis Date Noted   Low back pain 05/21/2019   Malignant neoplasm of prostate ([place]) 04/16/2017   Unilateral primary osteoarthritis, left knee 11/16/2016   OSA (obstructive sleep apnea) 04/18/2016   Sleep dysfunction with arousal disturbance 01/17/2016   Hyperlipidemia 09/14/2014   Diabetes 1.5, managed as type 2 ([place]) 07/07/2014   Carotid artery calcification 03/02/2011   Malignant neoplasm of kidney excluding renal pelvis ([place]) 11/14/2007   ALLERGIC RHINITIS 11/14/2007   DIVERTICULOSIS, COLON 04/16/2007   Essential hypertension 04/16/2007    Madelyn Flavors PT 10/29/2019, 3:51 PM  [facility] [address] [place], [place], [address] Phone: [phone]   Fax:  [phone]  Name: Bernardo Brayman. MRN: [id number] Date of Birth: October 20, 1950

## 2019-10-29 NOTE — Patient Instructions (Signed)
Access Code: [email or web address] URL: [email or web address] Date: 10/29/2019 Prepared by: Almyra Free  Exercises Seated Piriformis Stretch - 2 x daily - 7 x weekly - 3 sets - 20 sec hold Seated Piriformis Stretch - 2 x daily - 7 x weekly - 3 sets - 20 sec hold Supine Piriformis Stretch with Leg Straight - 2 x daily - 7 x weekly - 1 sets - 3 reps - 30 sec hold Supine 90/90 Sciatic Nerve Glide with Knee Flexion/Extension - 2 x daily - 7 x weekly - 10 reps - 5 seconds hold Sidelying Thoracic Rotation - 2 x daily - 7 x weekly - 15 reps Supine Bridge - 2 x daily - 7 x weekly - 1-3 sets - 10 reps Supine March with Alternating Leg Lifts - 1 x daily - 7 x weekly - 1-3 sets - 10 reps Seated Hip Flexor Stretch - 2 x daily - 7 x weekly - 3 reps - 1 sets - 30-60 sec hold

## 2019-11-05 ENCOUNTER — Ambulatory Visit: Payer: Medicare Other | Admitting: Physical Therapy

## 2019-11-05 ENCOUNTER — Other Ambulatory Visit: Payer: Self-pay

## 2019-11-05 DIAGNOSIS — M5442 Lumbago with sciatica, left side: Secondary | ICD-10-CM

## 2019-11-05 DIAGNOSIS — R293 Abnormal posture: Secondary | ICD-10-CM

## 2019-11-05 DIAGNOSIS — G8929 Other chronic pain: Secondary | ICD-10-CM

## 2019-11-05 NOTE — Therapy (Signed)
[facility] [address] [place], [place], [address] Phone: [phone]   Fax:  [phone]  Physical Therapy Treatment  Patient Details  Name: Jacob Stone. MRN: [id number] Date of Birth: 1950-08-12 Referring Provider (PT): Joni Fears, MD   Encounter Date: 11/05/2019   PT End of Session - 11/05/19 1443    Visit Number 5    Date for PT Re-Evaluation 11/20/19    Authorization Type 09/25/19 to 11/20/19    PT Start Time 8832    PT Stop Time 1442    PT Time Calculation (min) 39 min    Activity Tolerance No increased pain;Patient tolerated treatment well    Behavior During Therapy [facility] for tasks assessed/performed           Past Medical History:  Diagnosis Date  . Carotid artery calcification 03/2011   MILD HARD PLAQUE LEFT BULB, 0-39% BIL. ICA STENOSIS, PATENT VERTEBRAL ARTERIES WITH ANTEGRADE FLOW  . Diabetes type 2, controlled ([place])   . Diverticulitis   . Fatty liver   . FH: kidney cancer 2005   right kidney removed   . GERD (gastroesophageal reflux disease)   . Gout   . History of kidney stones    Left kidney  . HTN (hypertension)   . Hyperlipidemia   . OA (osteoarthritis)    Knee  . OSA (obstructive sleep apnea) 04/18/2016   SEVERE; UNABLE TO WEAR CPAP MORE THAN 2-3 HOURS PER NIGHT  . Plantar fasciitis   . Prostate cancer ([place])   . Seasonal allergies     Past Surgical History:  Procedure Laterality Date  . COLON SURGERY  2011   Partial colon removed due to diverticulitis  . COLONOSCOPY  08/2009  . NEPHRECTOMY Right   . PROSTATE BIOPSY    . RADIOACTIVE SEED IMPLANT N/A 08/03/2017   Procedure: RADIOACTIVE SEED IMPLANT/BRACHYTHERAPY IMPLANT;  Surgeon: Kathie Rhodes, MD;  Location: [facility];  Service: Urology;  Laterality: N/A;  . SPACE OAR INSTILLATION N/A 08/03/2017   Procedure: SPACE OAR INSTILLATION;  Surgeon: Kathie Rhodes, MD;  Location: [facility];   Service: Urology;  Laterality: N/A;    There were no vitals filed for this visit.   Subjective Assessment - 11/05/19 1454    Subjective increased pain that is more centralized noticed in the last 2 days. Lt side with weight bearing and bridges in bed    Patient Stated Goals improve pain with daily activity    Currently in Pain? Yes    Pain Score 5     Pain Location Back    Pain Orientation Left;Lower    Pain Descriptors / Indicators Aching    Multiple Pain Sites No                             [facility] Adult PT Treatment/Exercise - 11/05/19 0001      Lumbar Exercises: Stretches   Single Knee to Chest Stretch 2 reps;Left      Lumbar Exercises: Seated   Other Seated Lumbar Exercises side bending Rt for QL stretch- fwd flexion UE on pball with slight SB to the Rt - 3x 10 sec      Lumbar Exercises: Supine   Ab Set 10 reps    AB Set Limitations pelvic tilt with ball squeeze    Clam 15 reps    Clam Limitations yellow    Bent Knee Raise 15 reps  Trigger Point Dry Needling - 11/05/19 0001    Consent Given? Yes    Education Handout Provided Previously provided    Dry Needling Comments left    Lumbar multifidi Response Twitch response elicited                PT Education - 11/05/19 1443    Education Details Access Code: [email or web address]    Person(s) Educated Patient    Methods Explanation;Demonstration;Verbal cues;Handout;Tactile cues    Comprehension Verbalized understanding;Returned demonstration            PT Short Term Goals - 10/29/19 1458      PT SHORT TERM GOAL #1   Title Pt will be independent with his intitial HEP.    Baseline doing every day    Status Achieved             PT Long Term Goals - 10/29/19 1459      PT LONG TERM GOAL #1   Title Pt will be able to complete sit to stand transition throughout the day without pain or the need for UE support.    Status On-going      PT LONG TERM GOAL #2   Title --      PT LONG  TERM GOAL #3   Title Pt will report atleast 60% improvement in his pain from the start of PT.    Baseline 50-60% better since flare up; prior to that 80%    Status On-going      PT LONG TERM GOAL #4   Title Pt will have good understanding of his advanced HEP to allow him to continue making progress towards improving flexibility and strength following discharge.    Status Partially Met                 Plan - 11/05/19 1444    Clinical Impression Statement Pt presented with increased pain that was more central in lumbar spine to the left side.  Pt states had pain starting 2 days ago but prior to that everything was feeling good.  Pt had increased muscle spasm in Lt QL andlumbar multifidi.  Mild tension was palpated and release with STM in the Lt gluteals today.  During sit to stand patient was avoiding using Lt gluteals and then overactivated lumbar paraspinals to achieve upright posture.  Pt was educated in core strengthening and how to perform posterior pelvic tilt.  Added exercises to HEP as seen in chart.  He will benefit from skilled PT to work on co-contraction of core and gluteals and increased load bearing of these muscles for stability during funcitonal movements.    PT Treatment/Interventions ADLs/Self Care Home Management;Electrical Stimulation;Moist Heat;Therapeutic activities;Functional mobility training;Neuromuscular re-education;Therapeutic exercise;Patient/family education;Manual techniques;Passive range of motion;Taping;Dry needling;Spinal Manipulations    PT Next Visit Plan lifting/mechanics as tolerated, review new HEP core exercises; 3 things added today    PT Home Exercise Plan [email or web address]    Consulted and Agree with Plan of Care Patient           Patient will benefit from skilled therapeutic intervention in order to improve the following deficits and impairments:  Decreased range of motion, Decreased strength, Impaired UE functional use, Pain, Postural dysfunction,  Impaired flexibility, Increased muscle spasms, Decreased mobility  Visit Diagnosis: Abnormal posture  Chronic bilateral low back pain with bilateral sciatica     Problem List Patient Active Problem List   Diagnosis Date Noted  . Low back pain 05/21/2019  . Malignant neoplasm of prostate (  [place]) 04/16/2017  . Unilateral primary osteoarthritis, left knee 11/16/2016  . OSA (obstructive sleep apnea) 04/18/2016  . Sleep dysfunction with arousal disturbance 01/17/2016  . Hyperlipidemia 09/14/2014  . Diabetes 1.5, managed as type 2 ([place]) 07/07/2014  . Carotid artery calcification 03/02/2011  . Malignant neoplasm of kidney excluding renal pelvis ([place]) 11/14/2007  . ALLERGIC RHINITIS 11/14/2007  . DIVERTICULOSIS, COLON 04/16/2007  . Essential hypertension 04/16/2007    Jule Ser, PT 11/05/2019, 3:01 PM  [facility] [address] [place], [place], [address] Phone: [phone]   Fax:  [phone]  Name: Jacob Stone. MRN: [id number] Date of Birth: [birth year]

## 2019-11-05 NOTE — Patient Instructions (Signed)
Access Code: [email or web address] URL: [email or web address] Date: 11/05/2019 Prepared by: Jari Favre  Exercises Seated Piriformis Stretch - 3 x daily - 7 x weekly - 1 sets - 3 reps - 20 hold Seated Figure 4 Piriformis Stretch - 2 x daily - 7 x weekly - 1 sets - 3 reps - 20 hold Supine 90/90 Sciatic Nerve Glide with Knee Flexion/Extension - 2 x daily - 7 x weekly - 1 sets - 10 reps - 5 hold Sidelying Open Book Thoracic Rotation with Knee on Foam Roll - 2 x daily - 7 x weekly - 1 sets - 10 reps - 5 hold Clamshell - 2 x daily - 7 x weekly - 2 sets - 10 reps Seated Hamstring Stretch - 2 x daily - 7 x weekly - 1 sets - 3 reps - 20 hold Supine Transversus Abdominis Bracing - Hands on Stomach - 2 x daily - 7 x weekly - 1 sets - 5 reps - 5 hold Seated Transversus Abdominis Bracing - 2 x daily - 7 x weekly - 1 sets - 5 reps - 5 hold Hooklying Small March - 1 x daily - 7 x weekly - 10 reps - 2 sets Supine Posterior Pelvic Tilt with Pelvic Floor Contraction - 1 x daily - 7 x weekly - 3 sets - 10 reps Hooklying Isometric Clamshell - 1 x daily - 7 x weekly - 3 sets - 10 reps

## 2019-11-12 ENCOUNTER — Other Ambulatory Visit: Payer: Self-pay

## 2019-11-12 ENCOUNTER — Ambulatory Visit: Payer: Medicare Other | Attending: Orthopaedic Surgery

## 2019-11-12 DIAGNOSIS — G8929 Other chronic pain: Secondary | ICD-10-CM | POA: Diagnosis present

## 2019-11-12 DIAGNOSIS — M5442 Lumbago with sciatica, left side: Secondary | ICD-10-CM | POA: Insufficient documentation

## 2019-11-12 DIAGNOSIS — M5441 Lumbago with sciatica, right side: Secondary | ICD-10-CM | POA: Diagnosis present

## 2019-11-12 DIAGNOSIS — R293 Abnormal posture: Secondary | ICD-10-CM | POA: Insufficient documentation

## 2019-11-12 NOTE — Therapy (Signed)
[facility] [address] [place], [place], [address] Phone: [phone]   Fax:  [phone]  Physical Therapy Treatment  Patient Details  Name: Jacob Stone. MRN: [id number] Date of Birth: [birth year] Referring Provider (PT): Joni Fears, MD   Encounter Date: 11/12/2019   PT End of Session - 11/12/19 1607    Visit Number 6    PT Start Time 5809    PT Stop Time 9833    PT Time Calculation (min) 34 min    Activity Tolerance No increased pain;Patient tolerated treatment well    Behavior During Therapy [facility] for tasks assessed/performed           Past Medical History:  Diagnosis Date  . Carotid artery calcification 03/2011   MILD HARD PLAQUE LEFT BULB, 0-39% BIL. ICA STENOSIS, PATENT VERTEBRAL ARTERIES WITH ANTEGRADE FLOW  . Diabetes type 2, controlled ([place])   . Diverticulitis   . Fatty liver   . FH: kidney cancer 2005   right kidney removed   . GERD (gastroesophageal reflux disease)   . Gout   . History of kidney stones    Left kidney  . HTN (hypertension)   . Hyperlipidemia   . OA (osteoarthritis)    Knee  . OSA (obstructive sleep apnea) 04/18/2016   SEVERE; UNABLE TO WEAR CPAP MORE THAN 2-3 HOURS PER NIGHT  . Plantar fasciitis   . Prostate cancer ([place])   . Seasonal allergies     Past Surgical History:  Procedure Laterality Date  . COLON SURGERY  2011   Partial colon removed due to diverticulitis  . COLONOSCOPY  08/2009  . NEPHRECTOMY Right   . PROSTATE BIOPSY    . RADIOACTIVE SEED IMPLANT N/A 08/03/2017   Procedure: RADIOACTIVE SEED IMPLANT/BRACHYTHERAPY IMPLANT;  Surgeon: Kathie Rhodes, MD;  Location: [facility];  Service: Urology;  Laterality: N/A;  . SPACE OAR INSTILLATION N/A 08/03/2017   Procedure: SPACE OAR INSTILLATION;  Surgeon: Kathie Rhodes, MD;  Location: [facility];  Service: Urology;  Laterality: N/A;    There were no vitals filed for this  visit.   Subjective Assessment - 11/12/19 1534    Subjective I am feeling better than last week.  I am able to get back to my exercises again.  The needling has done as well as the exercises.              [facility] PT Assessment - 11/12/19 0001      Assessment   Medical Diagnosis chronic low back pain with sciatica     Referring Provider (PT) Joni Fears, MD    Onset Date/Surgical Date --   6 months      Observation/Other Assessments   Focus on Therapeutic Outcomes (FOTO)  33% limitation                         [facility] Adult PT Treatment/Exercise - 11/12/19 0001      Lumbar Exercises: Stretches   Single Knee to Chest Stretch --      Lumbar Exercises: Supine   Ab Set --    AB Set Limitations --      Manual Therapy   Manual Therapy Soft tissue mobilization    Manual therapy comments assessment of tissue for dry needling    Soft tissue mobilization elongation of tissue after dry needling to left lumbar and gluteals  Trigger Point Dry Needling - 11/12/19 0001    Consent Given? Yes    Education Handout Provided Previously provided    Muscles Treated Back/Hip Lumbar multifidi    Dry Needling Comments Lt and Rt    Lumbar multifidi Response Twitch response elicited                PT Education - 11/12/19 1611    Education Details verbal review of all HEP using visuals    Person(s) Educated Patient    Methods Explanation;Handout    Comprehension Verbalized understanding;Returned demonstration            PT Short Term Goals - 10/29/19 1458      PT SHORT TERM GOAL #1   Title Pt will be independent with his intitial HEP.    Baseline doing every day    Status Achieved             PT Long Term Goals - 11/12/19 1538      PT LONG TERM GOAL #1   Title Pt will be able to complete sit to stand transition throughout the day without pain or the need for UE support.    Status Achieved      PT LONG TERM GOAL #2   Title Pt will be able  to properly lift a 15# box from the floor 2/3 trials without the need for PT cuing or increase in pain.    Status Achieved      PT LONG TERM GOAL #3   Title Pt will report atleast 60% improvement in his pain from the start of PT.    Baseline 60-70% improvement      PT LONG TERM GOAL #4   Title Pt will have good understanding of his advanced HEP to allow him to continue making progress towards improving flexibility and strength following discharge.    Status Achieved                 Plan - 11/12/19 1613    Clinical Impression Statement Pt reports 60-70% overall reduction in pain since the start of care.  Pt denies any pain with sit to stand.  Pt performs all lifting especially at work with good mechanics and denies any pain.  Pt is independent and compliant in HEP for flexibility and core strength.  Pt with tension in Lt>Rt lumbar spine and demonstrated improved tissue mobility after manual therapy and dry needling today.  Pt will D/C to HEP.    PT Next Visit Plan D/C PT to HEP    PT Home Exercise Plan [id number]    Consulted and Agree with Plan of Care Patient           Patient will benefit from skilled therapeutic intervention in order to improve the following deficits and impairments:     Visit Diagnosis: Chronic bilateral low back pain with bilateral sciatica  Abnormal posture     Problem List Patient Active Problem List   Diagnosis Date Noted  . Low back pain 05/21/2019  . Malignant neoplasm of prostate ([place]) 04/16/2017  . Unilateral primary osteoarthritis, left knee 11/16/2016  . OSA (obstructive sleep apnea) 04/18/2016  . Sleep dysfunction with arousal disturbance 01/17/2016  . Hyperlipidemia 09/14/2014  . Diabetes 1.5, managed as type 2 ([place]) 07/07/2014  . Carotid artery calcification 03/02/2011  . Malignant neoplasm of kidney excluding renal pelvis ([place]) 11/14/2007  . ALLERGIC RHINITIS 11/14/2007  . DIVERTICULOSIS, COLON 04/16/2007  . Essential hypertension  04/16/2007   PHYSICAL  THERAPY DISCHARGE SUMMARY  Visits from Start of Care: 6  Current functional level related to goals / functional outcomes: See above for current status.     Remaining deficits: Pt with chronic LBP and has HEP in place to address remaining deficits.     Education / Equipment: HEP, posture/body mechanics  Plan: Patient agrees to discharge.  Patient goals were met. Patient is being discharged due to meeting the stated rehab goals.  ?????         Sigurd Sos, PT 11/12/19 4:18 PM  [facility] [address], [place], [place], [address] Phone: [phone]   Fax:  [phone]  Name: Semir Brill. MRN: [id number] Date of Birth: April 02, 1950

## 2020-02-12 DIAGNOSIS — L570 Actinic keratosis: Secondary | ICD-10-CM | POA: Diagnosis not present

## 2020-02-12 DIAGNOSIS — D485 Neoplasm of uncertain behavior of skin: Secondary | ICD-10-CM | POA: Diagnosis not present

## 2020-02-12 DIAGNOSIS — Z85828 Personal history of other malignant neoplasm of skin: Secondary | ICD-10-CM | POA: Diagnosis not present

## 2020-02-12 DIAGNOSIS — D2262 Melanocytic nevi of left upper limb, including shoulder: Secondary | ICD-10-CM | POA: Diagnosis not present

## 2020-02-12 DIAGNOSIS — L821 Other seborrheic keratosis: Secondary | ICD-10-CM | POA: Diagnosis not present

## 2020-02-12 DIAGNOSIS — C44622 Squamous cell carcinoma of skin of right upper limb, including shoulder: Secondary | ICD-10-CM | POA: Diagnosis not present

## 2020-02-12 DIAGNOSIS — D225 Melanocytic nevi of trunk: Secondary | ICD-10-CM | POA: Diagnosis not present

## 2020-02-23 ENCOUNTER — Other Ambulatory Visit: Payer: Self-pay | Admitting: Adult Health

## 2020-02-24 NOTE — Telephone Encounter (Signed)
DENIED.  PT IS PAST DUE FOR CPX. 

## 2020-02-25 ENCOUNTER — Ambulatory Visit: Payer: Medicare Other | Admitting: Adult Health

## 2020-02-25 NOTE — Progress Notes (Deleted)
Subjective:    Patient ID: Jacob Serve., male    DOB: 1950/06/03, 70 y.o.   MRN: [id number]  HPI 70 year old male who  has a past medical history of Carotid artery calcification (03/2011), Diabetes type 2, controlled ([facility]), Diverticulitis, Fatty liver, FH: kidney cancer (2005), GERD (gastroesophageal reflux disease), Gout, History of kidney stones, HTN (hypertension), Hyperlipidemia, OA (osteoarthritis), OSA (obstructive sleep apnea) (04/18/2016), Plantar fasciitis, Prostate cancer ([facility]), and Seasonal allergies.  He presents to the office today for 61-month follow-up regarding diabetes mellitus and hypertension.  DM-has been well maintained on metformin 500 mg daily.  He does not check his blood sugars at home on a routine basis denies episodes of hypo or hyperglycemia. Lab Results  Component Value Date   HGBA1C 6.6 (H) 08/08/2019    Essential Hypertension -currently prescribed Norvasc 10 mg daily, HCTZ 12.5 mg, and Cozaar 25 mg daily.  He does monitor his blood pressures at home and reports readings in the 120s to 130s over 70s to 80s.  He denies chest pain, shortness of breath, dizziness, lightheadedness, headaches, or syncopal episodes  BP Readings from Last 3 Encounters:  08/20/19 126/76  08/08/19 138/78  07/09/19 124/79     Review of Systems  Constitutional: Negative.   HENT: Negative.   Eyes: Negative.   Respiratory: Negative.   Cardiovascular: Negative.   Gastrointestinal: Negative.   Endocrine: Negative.   Genitourinary: Negative.   Musculoskeletal: Negative.   Skin: Negative.   Allergic/Immunologic: Negative.   Neurological: Negative.   Hematological: Negative.   Psychiatric/Behavioral: Negative.   All other systems reviewed and are negative.    Past Medical History:  Diagnosis Date  . Carotid artery calcification 03/2011   MILD HARD PLAQUE LEFT BULB, 0-39% BIL. ICA STENOSIS, PATENT VERTEBRAL ARTERIES WITH ANTEGRADE FLOW  . Diabetes type 2, controlled ([facility])    . Diverticulitis   . Fatty liver   . FH: kidney cancer 2005   right kidney removed   . GERD (gastroesophageal reflux disease)   . Gout   . History of kidney stones    Left kidney  . HTN (hypertension)   . Hyperlipidemia   . OA (osteoarthritis)    Knee  . OSA (obstructive sleep apnea) 04/18/2016   SEVERE; UNABLE TO WEAR CPAP MORE THAN 2-3 HOURS PER NIGHT  . Plantar fasciitis   . Prostate cancer ([facility])   . Seasonal allergies     Social History   Socioeconomic History  . Marital status: Married    Spouse name: Not on file  . Number of children: Not on file  . Years of education: Not on file  . Highest education level: Not on file  Occupational History  . Occupation: retired    Comment: Designer, industrial/product  . Occupation: part time    Comment: golf course  Tobacco Use  . Smoking status: Never Smoker  . Smokeless tobacco: Never Used  Vaping Use  . Vaping Use: Never used  Substance and Sexual Activity  . Alcohol use: Yes    Alcohol/week: 1.0 standard drink    Types: 1 Cans of beer per week    Comment: rare  . Drug use: No  . Sexual activity: Yes  Other Topics Concern  . 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 Difficulty of Paying Living Expenses: Not hard at all  Food Insecurity: No Food Insecurity  . Worried About Charity fundraiser in the Last Year: Never true  . Ran Out of Food in the Last Year: Never true  Transportation Needs: No Transportation Needs  . Lack of Transportation (Medical): No  . Lack of Transportation (Non-Medical): No  Physical Activity: Sufficiently Active  . Days of Exercise per Week: 2 days  . Minutes of Exercise per Session: 150+ min  Stress: No Stress Concern Present  . Feeling of Stress : Only a little  Social  Connections: Unknown  . Frequency of Communication with Friends and Family: More than three times a week  . Frequency of Social Gatherings with Friends and Family: Twice a week  . Attends Religious Services: Not on file  . Active Member of Clubs or Organizations: Not on file  . Attends Archivist Meetings: Not on file  . Marital Status: Married  Human resources officer Violence: Not on file    Past Surgical History:  Procedure Laterality Date  . COLON SURGERY  2011   Partial colon removed due to diverticulitis  . COLONOSCOPY  08/2009  . NEPHRECTOMY Right   . PROSTATE BIOPSY    . RADIOACTIVE SEED IMPLANT N/A 08/03/2017   Procedure: RADIOACTIVE SEED IMPLANT/BRACHYTHERAPY IMPLANT;  Surgeon: Kathie Rhodes, MD;  Location: [facility];  Service: Urology;  Laterality: N/A;  . SPACE OAR INSTILLATION N/A 08/03/2017   Procedure: SPACE OAR INSTILLATION;  Surgeon: Kathie Rhodes, MD;  [facility];  Service: Urology;  Laterality: N/A;    Family History  Problem Relation Age of Onset  . Heart disease Mother        heart failure - 8 and 48 when she passed   . Stroke Mother   . Cancer Father        wagners  . Glaucoma Father        wagoner's glaucoma  . Hypertension Other        multiple family members    Allergies  Allergen Reactions  . Oxycodone Other (See Comments)    Patient stated he has nightmares with oxycodone    Current Outpatient Medications on File Prior to Visit  Medication Sig Dispense Refill  . hydrochlorothiazide (HYDRODIURIL) 12.5 MG tablet TAKE 1 TABLET BY MOUTH DAILY 90 tablet 3  . losartan (COZAAR) 25 MG tablet TAKE 1 TABLET BY MOUTH DAILY 90 tablet 3  . allopurinol (ZYLOPRIM) 300 MG tablet TAKE 1 TABLET(300 MG) BY MOUTH DAILY 90 tablet 2  . amLODipine (NORVASC) 10 MG tablet TAKE 1 TABLET(10 MG) BY MOUTH DAILY 90 tablet 0  . aspirin 81 MG chewable tablet Chew by mouth.    . cetirizine (ZYRTEC) 10 MG tablet Take 10 mg by  mouth daily.      . Cholecalciferol (VITAMIN D) 125 MCG (5000 UT) CAPS Take by mouth daily.    . Docusate Calcium ([employer] STOOL SOFTENER PO) Take by mouth daily.     . metFORMIN (GLUCOPHAGE) 500 MG tablet TAKE 1 TABLET BY MOUTH BEFORE BREAKFAST 90 tablet 0  . Misc Natural Products (FIBER 7 PO) Take 2 tablets by mouth daily.     . Multiple Vitamins-Minerals (DAILY MULTI 50+ PO) Take 1 tablet by mouth daily.    . simvastatin (ZOCOR) 20 MG tablet TAKE 1 TABLET(20 MG) BY MOUTH DAILY 90 tablet 0  . tamsulosin (FLOMAX)  0.4 MG CAPS capsule Take 0.4 mg by mouth daily.     No current facility-administered medications on file prior to visit.    There were no vitals taken for this visit.      Objective:   Physical Exam Vitals and nursing note reviewed.  Constitutional:      Appearance: Normal appearance.  Cardiovascular:     Rate and Rhythm: Normal rate and regular rhythm.     Pulses: Normal pulses.     Heart sounds: Normal heart sounds.  Pulmonary:     Effort: Pulmonary effort is normal.     Breath sounds: Normal breath sounds.  Musculoskeletal:        General: Normal range of motion.  Skin:    General: Skin is warm and dry.     Capillary Refill: Capillary refill takes less than 2 seconds.  Neurological:     General: No focal deficit present.     Mental Status: He is alert and oriented to person, place, and time.  Psychiatric:        Mood and Affect: Mood normal.        Behavior: Behavior normal.        Thought Content: Thought content normal.        Judgment: Judgment normal.       Assessment & Plan:

## 2020-03-05 ENCOUNTER — Other Ambulatory Visit: Payer: Self-pay | Admitting: Adult Health

## 2020-03-05 DIAGNOSIS — I10 Essential (primary) hypertension: Secondary | ICD-10-CM

## 2020-03-05 DIAGNOSIS — E785 Hyperlipidemia, unspecified: Secondary | ICD-10-CM

## 2020-03-05 NOTE — Telephone Encounter (Signed)
Sent to the pharmacy by e-scribe. 

## 2020-03-19 ENCOUNTER — Telehealth: Payer: Self-pay | Admitting: Adult Health

## 2020-03-19 NOTE — Telephone Encounter (Signed)
Left message for patient to call back and schedule Medicare Annual Wellness Visit (AWV) either virtually or in office. Did not leave detailed message     Last AWV 03/18/19 please schedule at anytime with [facility] Nurse Health Advisor 1 or 2   This should be a 45 minute visit.

## 2020-04-21 DIAGNOSIS — H524 Presbyopia: Secondary | ICD-10-CM | POA: Diagnosis not present

## 2020-04-21 DIAGNOSIS — H04123 Dry eye syndrome of bilateral lacrimal glands: Secondary | ICD-10-CM | POA: Diagnosis not present

## 2020-04-21 DIAGNOSIS — H532 Diplopia: Secondary | ICD-10-CM | POA: Diagnosis not present

## 2020-04-21 DIAGNOSIS — E119 Type 2 diabetes mellitus without complications: Secondary | ICD-10-CM | POA: Diagnosis not present

## 2020-04-21 DIAGNOSIS — Z7984 Long term (current) use of oral hypoglycemic drugs: Secondary | ICD-10-CM | POA: Diagnosis not present

## 2020-04-21 DIAGNOSIS — H25013 Cortical age-related cataract, bilateral: Secondary | ICD-10-CM | POA: Diagnosis not present

## 2020-04-21 DIAGNOSIS — H2513 Age-related nuclear cataract, bilateral: Secondary | ICD-10-CM | POA: Diagnosis not present

## 2020-04-21 DIAGNOSIS — H52201 Unspecified astigmatism, right eye: Secondary | ICD-10-CM | POA: Diagnosis not present

## 2020-04-21 DIAGNOSIS — H5211 Myopia, right eye: Secondary | ICD-10-CM | POA: Diagnosis not present

## 2020-05-17 ENCOUNTER — Telehealth: Payer: Self-pay | Admitting: Adult Health

## 2020-05-17 NOTE — Telephone Encounter (Signed)
Left message for patient to call back and schedule Medicare Annual Wellness Visit (AWV) either virtually or in office. No detailed message left    Last AWV 03/18/19 please schedule at anytime with [facility] Nurse Health Advisor 1 or 2   This should be a 45 minute visit.

## 2020-05-24 ENCOUNTER — Other Ambulatory Visit: Payer: Self-pay | Admitting: Adult Health

## 2020-05-26 ENCOUNTER — Other Ambulatory Visit: Payer: Self-pay | Admitting: Adult Health

## 2020-06-10 NOTE — Progress Notes (Signed)
Subjective:   Jacob Stone. is a 70 y.o. male who presents for Medicare Annual/Subsequent preventive examination.  Review of Systems    n/a Cardiac Risk Factors include: advanced age (>72men, >69 women);diabetes mellitus;dyslipidemia;hypertension;male gender     Objective:    Today's Vitals   06/11/20 0821  BP: 122/78  Pulse: 75  Temp: 98 F (36.7 C)  SpO2: 98%  Weight: 180 lb (81.6 kg)  Height: 5\' 9"  (1.753 m)   Body mass index is 26.58 kg/m.  Advanced Directives 06/11/2020 09/25/2019 03/18/2019 12/10/2017 08/03/2017  Does Patient Have a Medical Advance Directive? No No No Yes No  Would patient like information on creating a medical advance directive? No - Patient declined No - Patient declined No - Patient declined - Yes (MAU/Ambulatory/Procedural Areas - Information given)    Current Medications (verified) Outpatient Encounter Medications as of 06/11/2020  Medication Sig  . allopurinol (ZYLOPRIM) 300 MG tablet TAKE 1 TABLET(300 MG) BY MOUTH DAILY  . amLODipine (NORVASC) 10 MG tablet TAKE 1 TABLET(10 MG) BY MOUTH DAILY  . aspirin 81 MG chewable tablet Chew by mouth.  . cetirizine (ZYRTEC) 10 MG tablet Take 10 mg by mouth daily.  . Cholecalciferol (VITAMIN D) 125 MCG (5000 UT) CAPS Take by mouth daily.  . Docusate Calcium ([employer] STOOL SOFTENER PO) Take by mouth daily.  . hydrochlorothiazide (HYDRODIURIL) 12.5 MG tablet TAKE 1 TABLET BY MOUTH DAILY  . losartan (COZAAR) 25 MG tablet TAKE 1 TABLET BY MOUTH DAILY  . metFORMIN (GLUCOPHAGE) 500 MG tablet TAKE 1 TABLET BY MOUTH BEFORE BREAKFAST  . Misc Natural Products (FIBER 7 PO) Take 2 tablets by mouth daily.   . Multiple Vitamins-Minerals (DAILY MULTI 50+ PO) Take 1 tablet by mouth daily.  . simvastatin (ZOCOR) 20 MG tablet TAKE 1 TABLET(20 MG) BY MOUTH DAILY  . tamsulosin (FLOMAX) 0.4 MG CAPS capsule Take 0.4 mg by mouth daily.   No facility-administered encounter medications on file as of 06/11/2020.    Allergies  (verified) Oxycodone   History: Past Medical History:  Diagnosis Date  . Carotid artery calcification 03/2011   MILD HARD PLAQUE LEFT BULB, 0-39% BIL. ICA STENOSIS, PATENT VERTEBRAL ARTERIES WITH ANTEGRADE FLOW  . Diabetes type 2, controlled ([place])   . Diverticulitis   . Fatty liver   . FH: kidney cancer 2005   right kidney removed   . GERD (gastroesophageal reflux disease)   . Gout   . History of kidney stones    Left kidney  . HTN (hypertension)   . Hyperlipidemia   . OA (osteoarthritis)    Knee  . OSA (obstructive sleep apnea) 04/18/2016   SEVERE; UNABLE TO WEAR CPAP MORE THAN 2-3 HOURS PER NIGHT  . Plantar fasciitis   . Prostate cancer ([place])   . Seasonal allergies    Past Surgical History:  Procedure Laterality Date  . COLON SURGERY  2011   Partial colon removed due to diverticulitis  . COLONOSCOPY  08/2009  . NEPHRECTOMY Right   . PROSTATE BIOPSY    . RADIOACTIVE SEED IMPLANT N/A 08/03/2017   Procedure: RADIOACTIVE SEED IMPLANT/BRACHYTHERAPY IMPLANT;  Surgeon: Kathie Rhodes, MD;  Location: [facility];  Service: Urology;  Laterality: N/A;  . SPACE OAR INSTILLATION N/A 08/03/2017   Procedure: SPACE OAR INSTILLATION;  Surgeon: Kathie Rhodes, MD;  Location: [facility];  Service: Urology;  Laterality: N/A;   Family History  Problem Relation Age of Onset  . Heart disease Mother  heart failure - 73 and 80 when she passed   . Stroke Mother   . Cancer Father        wagners  . Glaucoma Father        wagoner's glaucoma  . Hypertension Other        multiple family members   Social History   Socioeconomic History  . Marital status: Married    Spouse name: Not on file  . Number of children: Not on file  . Years of education: Not on file  . Highest education level: Not on file  Occupational History  . Occupation: retired    Comment: Physiological scientist  . Occupation: part time    Comment: golf course  Tobacco Use  . Smoking  status: Never Smoker  . Smokeless tobacco: Never Used  Vaping Use  . Vaping Use: Never used  Substance and Sexual Activity  . Alcohol use: Yes    Alcohol/week: 1.0 standard drink    Types: 1 Cans of beer per week    Comment: rare  . Drug use: No  . Sexual activity: Yes  Other Topics Concern  . 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 Difficulty of Paying Living Expenses: Not hard at all  Food Insecurity: No Food Insecurity  . Worried About Charity fundraiser in the Last Year: Never true  . Ran Out of Food in the Last Year: Never true  Transportation Needs: No Transportation Needs  . Lack of Transportation (Medical): No  . Lack of Transportation (Non-Medical): No  Physical Activity: Sufficiently Active  . Days of Exercise per Week: 5 days  . Minutes of Exercise per Session: 30 min  Stress: No Stress Concern Present  . Feeling of Stress : Not at all  Social Connections: Moderately Integrated  . Frequency of Communication with Friends and Family: More than three times a week  . Frequency of Social Gatherings with Friends and Family: More than three times a week  . Attends Religious Services: Never  . Active Member of Clubs or Organizations: Yes  . Attends Archivist Meetings: 1 to 4 times per year  . Marital Status: Married    Tobacco Counseling Counseling given: Not Answered   Clinical Intake:  Pre-visit preparation completed: Yes  Pain : No/denies pain     Nutritional Risks: None Diabetes: Yes CBG done?: No Did pt. bring in CBG monitor from home?: No  How often do you need to have someone help you when you read instructions, pamphlets, or other written materials from your doctor or pharmacy?: 1 - Never What is the last grade level you  completed in school?: BS  Diabetic?yes  Nutrition Risk Assessment:  Has the patient had any N/V/D within the last 2 months?  No  Does the patient have any non-healing wounds?  No  Has the patient had any unintentional weight loss or weight gain?  No   Diabetes:  Is the patient diabetic?  Yes  If diabetic, was a CBG obtained today?  No  Did the patient bring in their glucometer from home?  No  How often do you monitor your CBG's? never.   Financial Strains and Diabetes Management:  Are you having any  financial strains with the device, your supplies or your medication? Yes .  Does the patient want to be seen by Chronic Care Management for management of their diabetes?  Yes  Would the patient like to be referred to a Nutritionist or for Diabetic Management?  Yes   Diabetic Exams:  Diabetic Eye Exam: Overdue for diabetic eye exam. Pt has been advised about the importance in completing this exam. Patient advised to call and schedule an eye exam. Diabetic Foot Exam: Overdue, Pt has been advised about the importance in completing this exam. Pt is scheduled for diabetic foot exam on due with physical .   Interpreter Needed?: No  Information entered by :: L.Cabella Kimm,LPN   Activities of Daily Living In your present state of health, do you have any difficulty performing the following activities: 06/11/2020  Hearing? N  Vision? N  Difficulty concentrating or making decisions? N  Walking or climbing stairs? N  Dressing or bathing? N  Doing errands, shopping? N  Preparing Food and eating ? N  Using the Toilet? N  In the past six months, have you accidently leaked urine? N  Do you have problems with loss of bowel control? N  Managing your Medications? N  Managing your Finances? N  Housekeeping or managing your Housekeeping? N  Some recent data might be hidden    Patient Care Team: Dorothyann Peng, NP as PCP - General (Family Medicine) Magnus Sinning, MD as Consulting Physician  (Physical Medicine and Rehabilitation) Garald Balding, MD as Consulting Physician (Orthopedic Surgery)  Indicate any recent Medical Services you may have received from other than [facility] providers in the past year (date may be approximate).     Assessment:   This is a routine wellness examination for Jacob Stone.  Hearing/Vision screen  Hearing Screening   125Hz  250Hz  500Hz  1000Hz  2000Hz  3000Hz  4000Hz  6000Hz  8000Hz   Right ear:           Left ear:           Vision Screening Comments: Annual eye exams   Dietary issues and exercise activities discussed: Current Exercise Habits: Home exercise routine, Type of exercise: walking, Time (Minutes): 60, Frequency (Times/Week): 5, Weekly Exercise (Minutes/Week): 300, Intensity: Mild, Exercise limited by: None identified  Goals Addressed   None    Depression Screen PHQ 2/9 Scores 06/11/2020 08/08/2019 08/08/2019 03/18/2019 07/25/2018 12/10/2017 04/16/2017  PHQ - 2 Score 0 0 0 0 0 0 0  Exception Documentation - Patient refusal - - - - -    Fall Risk Fall Risk  06/11/2020 08/08/2019 08/08/2019 03/18/2019 07/25/2018  Falls in the past year? 0 0 0 0 0  Number falls in past yr: 0 - - - -  Injury with Fall? 0 - - - -  Risk for fall due to : - - - Medication side effect -  Follow up Falls evaluation completed - - Falls prevention discussed;Education provided;Falls evaluation completed -    FALL RISK PREVENTION PERTAINING TO THE HOME:  Any stairs in or around the home? Yes  If so, are there any without handrails? Yes  Home free of loose throw rugs in walkways, pet beds, electrical cords, etc? Yes  Adequate lighting in your home to reduce risk of falls? Yes   ASSISTIVE DEVICES UTILIZED TO PREVENT FALLS:  Life alert? No  Use of a cane, walker or w/c? No  Grab bars in the bathroom? Yes  Shower chair or bench in shower? Yes  Elevated toilet seat or a handicapped  toilet? Yes   TIMED UP AND GO:  Was the test performed? Yes .  Length of time to ambulate 10  feet: 6 sec.   Gait steady and fast without use of assistive device  Cognitive Function: MMSE - Mini Mental State Exam 12/10/2017  Not completed: (No Data)     6CIT Screen 03/18/2019  What Year? 0 points  What month? 0 points  What time? 0 points  Count back from 20 0 points  Months in reverse 0 points  Repeat phrase 0 points  Total Score 0    Immunizations Immunization History  Administered Date(s) Administered  . Fluad Quad(high Dose 65+) 11/06/2018  . Influenza Split 11/18/2010, 11/10/2011  . Influenza Whole 11/09/2009  . Influenza, High Dose Seasonal PF 11/25/2016, 11/23/2017  . Influenza,inj,Quad PF,6+ Mos 11/28/2012  . Influenza-Unspecified 12/02/2013, 11/28/2014, 11/21/2015  . PFIZER Comirnaty(Gray Top)Covid-19 Tri-Sucrose Vaccine 06/02/2020  . Pneumococcal Conjugate-13 04/06/2014  . Pneumococcal Polysaccharide-23 06/18/2017  . Td 07/30/2002, 04/06/2014  . Zoster 03/02/2011    TDAP status: Up to date  Flu Vaccine status: Up to date  Pneumococcal vaccine status: Up to date  Covid-19 vaccine status: Completed vaccines  Qualifies for Shingles Vaccine? Yes   Zostavax completed Yes   Shingrix Completed?: No.    Education has been provided regarding the importance of this vaccine. Patient has been advised to call insurance company to determine out of pocket expense if they have not yet received this vaccine. Advised may also receive vaccine at local pharmacy or Health Dept. Verbalized acceptance and understanding.  Screening Tests Health Maintenance  Topic Date Due  . HEMOGLOBIN A1C  02/08/2020  . OPHTHALMOLOGY EXAM  04/20/2020  . COVID-19 Vaccine (2 - Pfizer risk 4-dose series) 06/23/2020  . FOOT EXAM  08/07/2020  . INFLUENZA VACCINE  09/06/2020  . TETANUS/TDAP  04/05/2024  . COLONOSCOPY (Pts 45-62yrs Insurance coverage will need to be confirmed)  05/25/2024  . Hepatitis C Screening  Completed  . PNA vac Low Risk Adult  Completed  . HPV VACCINES  Aged Out     Health Maintenance  Health Maintenance Due  Topic Date Due  . HEMOGLOBIN A1C  02/08/2020  . OPHTHALMOLOGY EXAM  04/20/2020    Colorectal cancer screening: Type of screening: Colonoscopy. Completed 05/26/2019. Repeat every 5 years  Lung Cancer Screening: (Low Dose CT Chest recommended if Age 57-80 years, 30 pack-year currently smoking OR have quit w/in 15years.) does not qualify.   Lung Cancer Screening Referral: n/a  Additional Screening:  Hepatitis C Screening: does not qualify; Completed 07/25/2018 Vision Screening: Recommended annual ophthalmology exams for early detection of glaucoma and other disorders of the eye. Is the patient up to date with their annual eye exam?  Yes  Who is the provider or what is the name of the office in which the patient attends annual eye exams? Dr.Shipro If pt is not established with a provider, would they like to be referred to a provider to establish care? No .   Dental Screening: Recommended annual dental exams for proper oral hygiene  Community Resource Referral / Chronic Care Management: CRR required this visit?  yes  CCM required this visit?  No      Plan:     I have personally reviewed and noted the following in the patient's chart:   . Medical and social history . Use of alcohol, tobacco or illicit drugs  . Current medications and supplements including opioid prescriptions. Patient is not currently taking opioid prescriptions. . Functional  ability and status . Nutritional status . Physical activity . Advanced directives . List of other physicians . Hospitalizations, surgeries, and ER visits in previous 12 months . Vitals . Screenings to include cognitive, depression, and falls . Referrals and appointments  In addition, I have reviewed and discussed with patient certain preventive protocols, quality metrics, and best practice recommendations. A written personalized care plan for preventive services as well as general  preventive health recommendations were provided to patient.     Randel Pigg, LPN   03/17/5186   Nurse Notes: Pt request to see Diabetic nutritional to learn better ways to eat to maintain A1c as well health lifestyle.

## 2020-06-11 ENCOUNTER — Ambulatory Visit (INDEPENDENT_AMBULATORY_CARE_PROVIDER_SITE_OTHER): Payer: Medicare Other

## 2020-06-11 ENCOUNTER — Other Ambulatory Visit: Payer: Self-pay

## 2020-06-11 VITALS — BP 122/78 | HR 75 | Temp 98.0°F | Ht 69.0 in | Wt 180.0 lb

## 2020-06-11 DIAGNOSIS — Z0000 Encounter for general adult medical examination without abnormal findings: Secondary | ICD-10-CM

## 2020-06-11 DIAGNOSIS — R7309 Other abnormal glucose: Secondary | ICD-10-CM | POA: Diagnosis not present

## 2020-06-11 NOTE — Patient Instructions (Signed)
Jacob Stone , Thank you for taking time to come for your Medicare Wellness Visit. I appreciate your ongoing commitment to your health goals. Please review the following plan we discussed and let me know if I can assist you in the future.   Screening recommendations/referrals: Colonoscopy: current due 05/25/2024 Recommended yearly ophthalmology/optometry visit for glaucoma screening and checkup Recommended yearly dental visit for hygiene and checkup  Vaccinations: Influenza vaccine: current due in fall 2022 Pneumococcal vaccine: completed series Tdap vaccine: current  Shingles vaccine: will obtain local pharmacy   Advanced directives: will provide copies   Conditions/risks identified: Referral to Diabetic Nutritional as well CPE with Tommi Rumps to follow up on diabetes.   Next appointment: 08/11/2020  @ 730am  With Wade 72 Years and Older, Male Preventive care refers to lifestyle choices and visits with your health care provider that can promote health and wellness. What does preventive care include?  A yearly physical exam. This is also called an annual well check.  Dental exams once or twice a year.  Routine eye exams. Ask your health care provider how often you should have your eyes checked.  Personal lifestyle choices, including:  Daily care of your teeth and gums.  Regular physical activity.  Eating a healthy diet.  Avoiding tobacco and drug use.  Limiting alcohol use.  Practicing safe sex.  Taking low doses of aspirin every day.  Taking vitamin and mineral supplements as recommended by your health care provider. What happens during an annual well check? The services and screenings done by your health care provider during your annual well check will depend on your age, overall health, lifestyle risk factors, and family history of disease. Counseling  Your health care provider may ask you questions about your:  Alcohol use.  Tobacco  use.  Drug use.  Emotional well-being.  Home and relationship well-being.  Sexual activity.  Eating habits.  History of falls.  Memory and ability to understand (cognition).  Work and work Statistician. Screening  You may have the following tests or measurements:  Height, weight, and BMI.  Blood pressure.  Lipid and cholesterol levels. These may be checked every 5 years, or more frequently if you are over 26 years old.  Skin check.  Lung cancer screening. You may have this screening every year starting at age 33 if you have a 30-pack-year history of smoking and currently smoke or have quit within the past 15 years.  Fecal occult blood test (FOBT) of the stool. You may have this test every year starting at age 22.  Flexible sigmoidoscopy or colonoscopy. You may have a sigmoidoscopy every 5 years or a colonoscopy every 10 years starting at age 72.  Prostate cancer screening. Recommendations will vary depending on your family history and other risks.  Hepatitis C blood test.  Hepatitis B blood test.  Sexually transmitted disease (STD) testing.  Diabetes screening. This is done by checking your blood sugar (glucose) after you have not eaten for a while (fasting). You may have this done every 1-3 years.  Abdominal aortic aneurysm (AAA) screening. You may need this if you are a current or former smoker.  Osteoporosis. You may be screened starting at age 72 if you are at high risk. Talk with your health care provider about your test results, treatment options, and if necessary, the need for more tests. Vaccines  Your health care provider may recommend certain vaccines, such as:  Influenza vaccine. This is recommended every year.  Tetanus,  diphtheria, and acellular pertussis (Tdap, Td) vaccine. You may need a Td booster every 10 years.  Zoster vaccine. You may need this after age 37.  Pneumococcal 13-valent conjugate (PCV13) vaccine. One dose is recommended after age  61.  Pneumococcal polysaccharide (PPSV23) vaccine. One dose is recommended after age 79. Talk to your health care provider about which screenings and vaccines you need and how often you need them. This information is not intended to replace advice given to you by your health care provider. Make sure you discuss any questions you have with your health care provider. Document Released: 02/19/2015 Document Revised: 10/13/2015 Document Reviewed: 11/24/2014 Elsevier Interactive Patient Education  2017 [place] Prevention in the Home Falls can cause injuries. They can happen to people of all ages. There are many things you can do to make your home safe and to help prevent falls. What can I do on the outside of my home?  Regularly fix the edges of walkways and driveways and fix any cracks.  Remove anything that might make you trip as you walk through a door, such as a raised step or threshold.  Trim any bushes or trees on the path to your home.  Use bright outdoor lighting.  Clear any walking paths of anything that might make someone trip, such as rocks or tools.  Regularly check to see if handrails are loose or broken. Make sure that both sides of any steps have handrails.  Any raised decks and porches should have guardrails on the edges.  Have any leaves, snow, or ice cleared regularly.  Use sand or salt on walking paths during winter.  Clean up any spills in your garage right away. This includes oil or grease spills. What can I do in the bathroom?  Use night lights.  Install grab bars by the toilet and in the tub and shower. Do not use towel bars as grab bars.  Use non-skid mats or decals in the tub or shower.  If you need to sit down in the shower, use a plastic, non-slip stool.  Keep the floor dry. Clean up any water that spills on the floor as soon as it happens.  Remove soap buildup in the tub or shower regularly.  Attach bath mats securely with double-sided  non-slip rug tape.  Do not have throw rugs and other things on the floor that can make you trip. What can I do in the bedroom?  Use night lights.  Make sure that you have a light by your bed that is easy to reach.  Do not use any sheets or blankets that are too big for your bed. They should not hang down onto the floor.  Have a firm chair that has side arms. You can use this for support while you get dressed.  Do not have throw rugs and other things on the floor that can make you trip. What can I do in the kitchen?  Clean up any spills right away.  Avoid walking on wet floors.  Keep items that you use a lot in easy-to-reach places.  If you need to reach something above you, use a strong step stool that has a grab bar.  Keep electrical cords out of the way.  Do not use floor polish or wax that makes floors slippery. If you must use wax, use non-skid floor wax.  Do not have throw rugs and other things on the floor that can make you trip. What can I do with  my stairs?  Do not leave any items on the stairs.  Make sure that there are handrails on both sides of the stairs and use them. Fix handrails that are broken or loose. Make sure that handrails are as long as the stairways.  Check any carpeting to make sure that it is firmly attached to the stairs. Fix any carpet that is loose or worn.  Avoid having throw rugs at the top or bottom of the stairs. If you do have throw rugs, attach them to the floor with carpet tape.  Make sure that you have a light switch at the top of the stairs and the bottom of the stairs. If you do not have them, ask someone to add them for you. What else can I do to help prevent falls?  Wear shoes that:  Do not have high heels.  Have rubber bottoms.  Are comfortable and fit you well.  Are closed at the toe. Do not wear sandals.  If you use a stepladder:  Make sure that it is fully opened. Do not climb a closed stepladder.  Make sure that both  sides of the stepladder are locked into place.  Ask someone to hold it for you, if possible.  Clearly mark and make sure that you can see:  Any grab bars or handrails.  First and last steps.  Where the edge of each step is.  Use tools that help you move around (mobility aids) if they are needed. These include:  Canes.  Walkers.  Scooters.  Crutches.  Turn on the lights when you go into a dark area. Replace any light bulbs as soon as they burn out.  Set up your furniture so you have a clear path. Avoid moving your furniture around.  If any of your floors are uneven, fix them.  If there are any pets around you, be aware of where they are.  Review your medicines with your doctor. Some medicines can make you feel dizzy. This can increase your chance of falling. Ask your doctor what other things that you can do to help prevent falls. This information is not intended to replace advice given to you by your health care provider. Make sure you discuss any questions you have with your health care provider. Document Released: 11/19/2008 Document Revised: 07/01/2015 Document Reviewed: 02/27/2014 Elsevier Interactive Patient Education  2017 [employer].

## 2020-06-14 ENCOUNTER — Telehealth: Payer: Self-pay | Admitting: Adult Health

## 2020-06-14 DIAGNOSIS — E1169 Type 2 diabetes mellitus with other specified complication: Secondary | ICD-10-CM

## 2020-06-14 NOTE — Telephone Encounter (Signed)
Pt is calling in stating that the referral for Nutrition and Diabetes called him and stated that he does not qualify due to the referral stating that he is pre-diabetic but it Tommi Rumps would amend the referral to state that he is diabetic Medicare will pay for the visits.  Medicare guidelines state that the pt is really diabetic from his A1C readings.  He qualifies through Medicare b/c his A1C is 6.5 and the referral is stating that pt is pre-diabetic and Medicare will not cover pt would like to see if it can be changed so that his insurance will cover the visits.

## 2020-06-24 ENCOUNTER — Other Ambulatory Visit: Payer: Self-pay | Admitting: Adult Health

## 2020-07-14 ENCOUNTER — Other Ambulatory Visit: Payer: Self-pay | Admitting: Adult Health

## 2020-07-15 ENCOUNTER — Telehealth (INDEPENDENT_AMBULATORY_CARE_PROVIDER_SITE_OTHER): Payer: Medicare Other | Admitting: Family Medicine

## 2020-07-15 ENCOUNTER — Encounter: Payer: Self-pay | Admitting: Family Medicine

## 2020-07-15 ENCOUNTER — Other Ambulatory Visit: Payer: Self-pay

## 2020-07-15 VITALS — BP 132/75 | HR 72

## 2020-07-15 DIAGNOSIS — U071 COVID-19: Secondary | ICD-10-CM

## 2020-07-15 MED ORDER — MOLNUPIRAVIR EUA 200MG CAPSULE: 4.0000 | ORAL_CAPSULE | Freq: Two times a day (BID) | ORAL | 0 refills | Status: AC

## 2020-07-15 MED ORDER — BENZONATATE 100 MG PO CAPS
100.0000 mg | ORAL_CAPSULE | Freq: Three times a day (TID) | ORAL | 0 refills | Status: DC | PRN
Start: 2020-07-15 — End: 2020-09-23

## 2020-07-15 NOTE — Progress Notes (Signed)
Virtual Visit via Telephone Note  I connected with Jacob Stone. on 07/15/20 at 11:00 AM EDT by telephone and verified that I am speaking with the correct person using two identifiers.   I discussed the limitations, risks, security and privacy concerns of performing an evaluation and management service by telephone and the availability of in person appointments. I also discussed with the patient that there may be a patient responsible charge related to this service. The patient expressed understanding and agreed to proceed.  Location patient: home, [place] Location provider: work or home office Participants present for the call: patient, provider Patient did not have a visit with me in the prior 7 days to address this/these issue(s).   History of Present Illness:  Acute telemedicine visit for Covid19: -Onset: 2 days ago -Symptoms include: cough, some body aches, a little sinus congestion, diarrhea yesterday -home covid test was positive -Denies: fevers, vomiting, CP, SOB, inability to eat/drink/get out of bed -Has tried:delsym, tylenol -Pertinent past medical history: HTN, CAD, remote hx renal ca and prostate -Pertinent medication allergies:  Allergies  Allergen Reactions   Oxycodone Other (See Comments)    Patient stated he has nightmares with oxycodone  -COVID-19 vaccine status: had 2 doses and 2 booster -no recent labs   Observations/Objective: Patient sounds cheerful and well on the phone. I do not appreciate any SOB. Speech and thought processing are grossly intact. Patient reported vitals:  Assessment and Plan:  COVID-19   Discussed treatment options, ideal treatment window, potential complications, isolation and precautions for COVID-19.  After lengthy discussion, the patient opted for treatment with molnupiravir due to being higher risk for complications of covid or severe disease and other factors. Discussed EUA status of this drug and the fact that there is  preliminary limited knowledge of risks/interactions/side effects per EUA document vs possible benefits and precautions. This information was shared with patient during the visit and also was provided in patient instructions. Also, advised that patient discuss risks/interactions and use with pharmacist/treatment team as well. The patient did want a prescription for cough, Tessalon Rx sent.  Other symptomatic care measures summarized in patient instructions. Work/School slipped offered:  declined Advised to seek prompt in person care if worsening, new symptoms arise, or if is not improving with treatment. Advised of options for inperson care in case PCP office not available. Did let the patient know that I only do telemedicine shifts for [facility] on Tuesdays and Thursdays and advised a follow up visit with PCP or at an [facility] if has further questions or concerns.   Follow Up Instructions:  I did not refer this patient for an OV with me in the next 24 hours for this/these issue(s).  I discussed the assessment and treatment plan with the patient. The patient was provided an opportunity to ask questions and all were answered. The patient agreed with the plan and demonstrated an understanding of the instructions.   I spent 16 minutes on the date of this visit in the care of this patient. See summary of tasks completed to properly care for this patient in the detailed notes above which also included counseling of above, review of PMH, medications, allergies, evaluation of the patient and ordering and/or  instructing patient on testing and care options.     Lucretia Kern, DO

## 2020-07-15 NOTE — Patient Instructions (Signed)
  HOME CARE TIPS:  -[facility] testing information: https://www.[facility].org/ [place] [phone] Most pharmacies also offer testing and home test kits. If the Covid19 test is positive, please make a prompt follow up visit with your primary care office or with [facility] to discuss treatment options. Treatments for Covid19 are best given early in the course of the illness.   -I sent the medication(s) we discussed to your pharmacy: No orders of the defined types were placed in this encounter.    -I sent in the [facility] treatment or referral you requested per our discussion. Please see the information provided below and discuss further with the pharmacist/treatment team.   -can use tylenol or aleve if needed for fevers, aches and pains per instructions  -can use nasal saline a few times per day if you have nasal congestion; sometimes  a short course of Afrin nasal spray for 3 days can help with symptoms as well  -stay hydrated, drink plenty of fluids and eat small healthy meals - avoid dairy  -can take 1000 IU (24mcg) Vit D3 and 100-500 mg of Vit C daily per instructions  -If the Covid test is positive, check out the [facility] website for more information on home care, transmission and treatment for COVID19  -follow up with your doctor in 2-3 days unless improving and feeling better  -stay home while sick, except to seek medical care. If you have COVID19, ideally it would be best to stay home for a full 10 days since the onset of symptoms PLUS one day of no fever and feeling better. Wear a good mask that fits snugly (such as N95 or KN95) if around others to reduce the risk of transmission.  It was nice to meet you today, and I really hope you are feeling better soon. I help [facility] out with telemedicine visits on Tuesdays and Thursdays and am available for visits on those days. If you have any concerns or questions following this visit please schedule a follow  up visit with your Primary Care doctor or seek care at a local urgent care clinic to avoid delays in care.    Seek in person care or schedule a follow up video visit promptly if your symptoms worsen, new concerns arise or you are not improving with treatment. Call 911 and/or seek emergency care if your symptoms are severe or life threatening.

## 2020-08-04 ENCOUNTER — Encounter: Payer: Medicare Other | Attending: Adult Health | Admitting: Dietician

## 2020-08-04 ENCOUNTER — Encounter: Payer: Self-pay | Admitting: Dietician

## 2020-08-04 ENCOUNTER — Other Ambulatory Visit: Payer: Self-pay

## 2020-08-04 DIAGNOSIS — E139 Other specified diabetes mellitus without complications: Secondary | ICD-10-CM | POA: Diagnosis not present

## 2020-08-04 NOTE — Patient Instructions (Signed)
Work towards eating three meals a day, about 5-6 hours apart! If you are not able to eat a meal within this time, have a balanced snack in between meals.  Use your "Snack Time" handout for balanced snack ideas.  Begin to recognize carbohydrates in your food choices!  Begin to build your meals using the proportions of the Balanced Plate. First, select your carb choice(starches/grains/fruits) for the meal Next, select your source of protein to pair with your carb choice(starches/grains/fruits). Finally, complete the remaining half of your meal with a variety of non-starchy vegetables.  Keep up the great work exercising!! This is keeping your blood sugar lower every time you do it!

## 2020-08-04 NOTE — Progress Notes (Signed)
Diabetes Self-Management Education  Visit Type: First/Initial  Appt. Start Time: 1600 Appt. End Time: 6468  08/04/2020  [name], identified by name and date of birth, is a 70 y.o. male with a diagnosis of Diabetes: Type 2.   ASSESSMENT Pt reports history of prediabetes, just recently was diagnosed as diabetic. Pt has had A1c over 6.5 for over 4 years, just got their diagnosis of diabetes as of May 2022. Pt will be getting new blood work done on July 7th. Pt is taking metformin once a day, and has been for the last 3 years. No side effects reported. Pt reports getting a bowel resection due to diverticulitis in 2011. No remaining symptoms.  Pt reports having a kidney removed in 2006 due to a malignant tumor. Pt reports occasional GERD that can be controlled with Tums. Pt reports high fat foods cause symptoms. Pt states that their diet is not great, feels that they can improve it.  Pt reports switching to Zero sodas and half and half tea, states regular soda has begun to taste way too sweet. Pt exercises 4 days a week, cardio and light resistance training. 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 Pt walks 4-5 miles while working.  Height 5\' 9"  (1.753 m), weight 192 lb 11.2 oz (87.4 kg). Body mass index is 28.46 kg/m.   Diabetes Self-Management Education - 08/04/20 1607       Visit Information   Visit Type First/Initial      Initial Visit   Diabetes Type Type 2    Are you currently following a meal plan? No    Are you taking your medications as prescribed? Yes    Date Diagnosed May 2022      Health Coping   How would you rate your overall health? Good      Psychosocial Assessment   Patient Belief/Attitude about Diabetes Motivated to manage diabetes    Self-care barriers None    Self-management support Doctor's office;Family    Other persons present Patient    Patient Concerns Nutrition/Meal planning    Special Needs None    Preferred Learning Style No  preference indicated    Learning Readiness Ready    How often do you need to have someone help you when you read instructions, pamphlets, or other written materials from your doctor or pharmacy? 1 - Never    What is the last grade level you completed in school? 4 yrs college      Pre-Education Assessment   Patient understands the diabetes disease and treatment process. Needs Instruction    Patient understands incorporating nutritional management into lifestyle. Needs Instruction    Patient undertands incorporating physical activity into lifestyle. Needs Instruction    Patient understands using medications safely. Needs Instruction    Patient understands monitoring blood glucose, interpreting and using results Needs Instruction    Patient understands prevention, detection, and treatment of acute complications. Needs Instruction    Patient understands prevention, detection, and treatment of chronic complications. Needs Instruction    Patient understands how to develop strategies to address psychosocial issues. Needs Instruction    Patient understands how to develop strategies to promote health/change behavior. Needs Instruction      Complications   Last HgB A1C per patient/outside source 6.7 %   06/2020   How often do you check your blood sugar? 0 times/day (not testing)    Have you had a dilated eye exam in the past 12 months? Yes  Have you had a dental exam in the past 12 months? Yes    Are you checking your feet? Yes    How many days per week are you checking your feet? 3      Dietary Intake   Breakfast Large Blueberry muffin, coffee w/ a little sugar and 2% milk (7:00 am)    Snack (morning) none    Lunch Hamburger steak, mashed potatoes, pinto beans, 1 piece of toast, half and half tea (12:30 pm)    Snack (afternoon) pretzels, gatorade    Dinner [employer], 3 slices, sierra mist Zero    Beverage(s) coffee, half and half tea, sierra mist zero      Exercise   Exercise Type  ADL's;Moderate (swimming / aerobic walking)    How many days per week to you exercise? 4    How many minutes per day do you exercise? 45    Total minutes per week of exercise 180      Patient Education   Previous Diabetes Education No    Disease state  Factors that contribute to the development of diabetes;Definition of diabetes, type 1 and 2, and the diagnosis of diabetes    Nutrition management  Food label reading, portion sizes and measuring food.;Role of diet in the treatment of diabetes and the relationship between the three main macronutrients and blood glucose level    Physical activity and exercise  Role of exercise on diabetes management, blood pressure control and cardiac health.;Helped patient identify appropriate exercises in relation to his/her diabetes, diabetes complications and other health issue.    Medications Reviewed patients medication for diabetes, action, purpose, timing of dose and side effects.    Chronic complications Nephropathy, what it is, prevention of, the use of ACE, ARB's and early detection of through urine microalbumia.;Relationship between chronic complications and blood glucose control    Psychosocial adjustment Role of stress on diabetes    Personal strategies to promote health Lifestyle issues that need to be addressed for better diabetes care      Individualized Goals (developed by patient)   Nutrition Follow meal plan discussed    Physical Activity Exercise 3-5 times per week    Medications take my medication as prescribed    Monitoring  Not Applicable      Post-Education Assessment   Patient understands the diabetes disease and treatment process. Needs Review    Patient understands incorporating nutritional management into lifestyle. Needs Review    Patient undertands incorporating physical activity into lifestyle. Needs Review    Patient understands using medications safely. Needs Review    Patient understands monitoring blood glucose, interpreting  and using results Needs Review    Patient understands prevention, detection, and treatment of acute complications. Needs Review    Patient understands prevention, detection, and treatment of chronic complications. Needs Review    Patient understands how to develop strategies to address psychosocial issues. Needs Review    Patient understands how to develop strategies to promote health/change behavior. Needs Review      Outcomes   Expected Outcomes Demonstrated interest in learning. Expect positive outcomes    Future DMSE 4-6 wks    Program Status Not Completed             Individualized Plan for Diabetes Self-Management Training:   Learning Objective:  Patient will have a greater understanding of diabetes self-management. Patient education plan is to attend individual and/or group sessions per assessed needs and concerns.   Plan:   There  are no Patient Instructions on file for this visit.  Expected Outcomes:  Demonstrated interest in learning. Expect positive outcomes  Education material provided: My Plate and Snack sheet  If problems or questions, patient to contact team via:  Phone and Email  Future DSME appointment: 4-6 wks

## 2020-08-11 ENCOUNTER — Encounter: Payer: Medicare Other | Admitting: Adult Health

## 2020-08-20 ENCOUNTER — Other Ambulatory Visit: Payer: Self-pay | Admitting: Adult Health

## 2020-08-22 ENCOUNTER — Other Ambulatory Visit: Payer: Self-pay | Admitting: Adult Health

## 2020-08-29 ENCOUNTER — Other Ambulatory Visit: Payer: Self-pay | Admitting: Adult Health

## 2020-08-29 DIAGNOSIS — E785 Hyperlipidemia, unspecified: Secondary | ICD-10-CM

## 2020-08-29 DIAGNOSIS — I10 Essential (primary) hypertension: Secondary | ICD-10-CM

## 2020-09-22 ENCOUNTER — Other Ambulatory Visit: Payer: Self-pay

## 2020-09-23 ENCOUNTER — Ambulatory Visit (INDEPENDENT_AMBULATORY_CARE_PROVIDER_SITE_OTHER): Payer: Medicare Other | Admitting: Adult Health

## 2020-09-23 ENCOUNTER — Encounter: Payer: Medicare Other | Attending: Adult Health | Admitting: Dietician

## 2020-09-23 ENCOUNTER — Encounter: Payer: Self-pay | Admitting: Adult Health

## 2020-09-23 ENCOUNTER — Encounter: Payer: Self-pay | Admitting: Dietician

## 2020-09-23 VITALS — BP 102/70 | HR 90 | Temp 98.0°F | Ht 69.0 in | Wt 190.0 lb

## 2020-09-23 DIAGNOSIS — E139 Other specified diabetes mellitus without complications: Secondary | ICD-10-CM | POA: Insufficient documentation

## 2020-09-23 DIAGNOSIS — E785 Hyperlipidemia, unspecified: Secondary | ICD-10-CM

## 2020-09-23 DIAGNOSIS — Z8546 Personal history of malignant neoplasm of prostate: Secondary | ICD-10-CM | POA: Diagnosis not present

## 2020-09-23 DIAGNOSIS — Z0000 Encounter for general adult medical examination without abnormal findings: Secondary | ICD-10-CM | POA: Diagnosis not present

## 2020-09-23 DIAGNOSIS — I10 Essential (primary) hypertension: Secondary | ICD-10-CM | POA: Diagnosis not present

## 2020-09-23 DIAGNOSIS — E1169 Type 2 diabetes mellitus with other specified complication: Secondary | ICD-10-CM

## 2020-09-23 DIAGNOSIS — M109 Gout, unspecified: Secondary | ICD-10-CM | POA: Diagnosis not present

## 2020-09-23 LAB — CBC WITH DIFFERENTIAL/PLATELET
Basophils Absolute: 0 10*3/uL (ref 0.0–0.1)
Basophils Relative: 0.6 % (ref 0.0–3.0)
Eosinophils Absolute: 0.5 10*3/uL (ref 0.0–0.7)
Eosinophils Relative: 9.3 % — ABNORMAL HIGH (ref 0.0–5.0)
HCT: 46.6 % (ref 39.0–52.0)
Hemoglobin: 15.6 g/dL (ref 13.0–17.0)
Lymphocytes Relative: 22.6 % (ref 12.0–46.0)
Lymphs Abs: 1.2 10*3/uL (ref 0.7–4.0)
MCHC: 33.4 g/dL (ref 30.0–36.0)
MCV: 94.4 fl (ref 78.0–100.0)
Monocytes Absolute: 0.5 10*3/uL (ref 0.1–1.0)
Monocytes Relative: 9.2 % (ref 3.0–12.0)
Neutro Abs: 3.2 10*3/uL (ref 1.4–7.7)
Neutrophils Relative %: 58.3 % (ref 43.0–77.0)
Platelets: 257 10*3/uL (ref 150.0–400.0)
RBC: 4.94 Mil/uL (ref 4.22–5.81)
RDW: 14.2 % (ref 11.5–15.5)
WBC: 5.5 10*3/uL (ref 4.0–10.5)

## 2020-09-23 LAB — PSA: PSA: 0.04 ng/mL — ABNORMAL LOW (ref 0.10–4.00)

## 2020-09-23 LAB — COMPREHENSIVE METABOLIC PANEL
ALT: 30 U/L (ref 0–53)
AST: 24 U/L (ref 0–37)
Albumin: 4.4 g/dL (ref 3.5–5.2)
Alkaline Phosphatase: 56 U/L (ref 39–117)
BUN: 20 mg/dL (ref 6–23)
CO2: 28 mEq/L (ref 19–32)
Calcium: 9.7 mg/dL (ref 8.4–10.5)
Chloride: 102 mEq/L (ref 96–112)
Creatinine, Ser: 1.39 mg/dL (ref 0.40–1.50)
GFR: 50.61 mL/min — ABNORMAL LOW (ref >60.00)
Glucose, Bld: 129 mg/dL — ABNORMAL HIGH (ref 70–99)
Potassium: 4.3 mEq/L (ref 3.5–5.1)
Sodium: 140 mEq/L (ref 135–145)
Total Bilirubin: 0.8 mg/dL (ref 0.2–1.2)
Total Protein: 7.2 g/dL (ref 6.0–8.3)

## 2020-09-23 LAB — LIPID PANEL
Cholesterol: 107 mg/dL (ref 0–200)
HDL: 44.5 mg/dL (ref >39.00)
LDL Cholesterol: 29 mg/dL (ref 0–99)
NonHDL: 62.53
Total CHOL/HDL Ratio: 2
Triglycerides: 167 mg/dL — ABNORMAL HIGH (ref 0.0–149.0)
VLDL: 33.4 mg/dL (ref 0.0–40.0)

## 2020-09-23 LAB — HEMOGLOBIN A1C: Hgb A1c MFr Bld: 6.7 % — ABNORMAL HIGH (ref 4.6–6.5)

## 2020-09-23 LAB — TSH: TSH: 2.47 u[IU]/mL (ref 0.35–5.50)

## 2020-09-23 NOTE — Patient Instructions (Signed)
It was great seeing you today   Continue to stay active and work on your diet   We will follow up with you regarding your blood work

## 2020-09-23 NOTE — Progress Notes (Signed)
Diabetes Self-Management Education  Visit Type: Follow-up  Appt. Start Time: 1445 Appt. End Time: 1520  09/23/2020  [name], identified by name and date of birth, is a 70 y.o. male with a diagnosis of Diabetes: Type 2.   ASSESSMENT Pt had a physical this morning and got bloodwork done, A1c is up to 6.7 from 6.6. Triglycerides are still elevated. Pt reports wanting to get their A1c back down under 6.5 and lose a little belly fat. Pt reports their biggest problem has been getting half a plate of non-starchy vegetables and too much protein. Pt has been trying to eat a balanced meal at least once a day. Pt reports their wife has some health concerns that make it difficult for them to eat the same thing sometimes. Pt reports doing a lot of cooking. Pt reports having self control over junkfood snacks is an obstacle at times, but has been snacking on fruits more often.  Pt is still doing their exercise 4 times a week, and walking a lot throughout the week.  Height '5\' 9"'$  (1.753 m), weight 191 lb 9.6 oz (86.9 kg). Body mass index is 28.29 kg/m.   Diabetes Self-Management Education - 09/23/20 1459       Visit Information   Visit Type Follow-up      Initial Visit   Diabetes Type Type 2    Are you taking your medications as prescribed? Yes      Complications   Last HgB A1C per patient/outside source 6.7 %   09/23/2020     Dietary Intake   Breakfast Cup of coffee, Kind protein bar    Lunch Chicken soft tacos w/ lettuce, cheese, peppers, rice, lemonade    Snack (afternoon) gatorade    Health visitor [employer], [place] Zero    Beverage(s) Coffee, water, lemonade, Zero soda      Exercise   Exercise Type ADL's;Moderate (swimming / aerobic walking)    How many days per week to you exercise? 4    How many minutes per day do you exercise? 60    Total minutes per week of exercise 240      Patient Self-Evaluation of Goals - Patient rates self as meeting  previously set goals (% of time)   Nutrition 50 - 75 %    Physical Activity >75%    Medications >75%    Monitoring Not Applicable    Problem Solving 50 - 75 %    Reducing Risk 50 - 75 %    Health Coping 50 - 75 %      Post-Education Assessment   Patient understands the diabetes disease and treatment process. Needs Review    Patient understands incorporating nutritional management into lifestyle. Needs Review    Patient undertands incorporating physical activity into lifestyle. Demonstrates understanding / competency    Patient understands using medications safely. Demonstrates understanding / competency    Patient understands prevention, detection, and treatment of chronic complications. Needs Review    Patient understands how to develop strategies to address psychosocial issues. Needs Review    Patient understands how to develop strategies to promote health/change behavior. Needs Review      Outcomes   Expected Outcomes Demonstrated interest in learning. Expect positive outcomes    Future DMSE 3-4 months    Program Status Not Completed      Subsequent Visit   Since your last visit have you continued or begun to take your medications as prescribed? Yes  Since your last visit have you had your blood pressure checked? Yes    Is your most recent blood pressure lower, unchanged, or higher since your last visit? Lower    Since your last visit have you experienced any weight changes? Loss    Weight Loss (lbs) 1    Since your last visit, are you checking your blood glucose at least once a day? N/A             Individualized Plan for Diabetes Self-Management Training:   Learning Objective:  Patient will have a greater understanding of diabetes self-management. Patient education plan is to attend individual and/or group sessions per assessed needs and concerns.   Plan:   Patient Instructions  Try to eat no more than 55-65g (~9 oz) of protein each day.  Continue to work on  increasing your proportion of non-starchy vegetables.  Consider beginning an Omega-3 supplement. Look for "Burpless" or "Burp-Free" supplements to eliminate unpleasant fish burps.   Consider looking into meal services like "NutriSystem" or "Freshly"  Expected Outcomes:  Demonstrated interest in learning. Expect positive outcomes  Education material provided: My Plate Foods List  If problems or questions, patient to contact team via:  Phone and Email  Future DSME appointment: 3-4 months

## 2020-09-23 NOTE — Addendum Note (Signed)
Addended by: Elmer Picker on: 09/23/2020 07:33 AM   Modules accepted: Orders

## 2020-09-23 NOTE — Patient Instructions (Addendum)
Try to eat no more than 55-65g (~9 oz) of protein each day.  Continue to work on increasing your proportion of non-starchy vegetables.  Consider beginning an Omega-3 supplement. Look for "Burpless" or "Burp-Free" supplements to eliminate unpleasant fish burps.   Consider looking into meal services like "NutriSystem" or "Freshly"

## 2020-09-23 NOTE — Progress Notes (Signed)
Subjective:    Patient ID: Jacob Sermons., male    DOB: 08-20-1950, 70 y.o.   MRN: [id number]  HPI Patient presents for yearly preventative medicine examination. He is a pleasant 70 year old male who  has a past medical history of Carotid artery calcification (03/2011), Diabetes type 2, controlled ([place]), Diverticulitis, Fatty liver, FH: kidney cancer (2005), GERD (gastroesophageal reflux disease), Gout, History of kidney stones, HTN (hypertension), Hyperlipidemia, OA (osteoarthritis), OSA (obstructive sleep apnea) (04/18/2016), Plantar fasciitis, Prostate cancer ([place]), and Seasonal allergies.  DM -prescribed metformin 500 mg daily.  He does not monitor his blood sugars at home.  He denies signs of hypoglycemia.  His A1c has been controlled for a number of years. He is working with a dietician to help with making healthier eating choices.  Lab Results  Component Value Date   HGBA1C 6.6 (H) 08/08/2019   Essential Hypertension -prescribed Norvasc 10 mg daily, HCTZ 25 mg daily, and Cozaar 25 mg daily.  He does monitor his blood pressures at home periodically and reports readings in the 120s to 130s over 70s to 80s.  He denies episodes of dizziness, lightheadedness, shortness of breath, headaches, or chest pain BP Readings from Last 3 Encounters:  09/23/20 102/70  07/15/20 132/75  06/11/20 122/78   Hyperlipidemia-prescribed Zocor 20 mg daily.  He denies myalgia or fatigue Lab Results  Component Value Date   CHOL 127 08/08/2019   HDL 48.80 08/08/2019   LDLCALC 43 08/08/2019   LDLDIRECT 103.0 07/01/2014   TRIG 179.0 (H) 08/08/2019   CHOLHDL 3 08/08/2019   History of gout-currently well controlled on allopurinol 300 mg daily.  He denies any recent gout flares  History of prostate cancer-had seed implant in June 2019.  He is followed by [facility] urology every 6 months.  Denies symptoms.   All immunizations and health maintenance protocols were reviewed with the patient and needed orders  were placed.  Appropriate screening laboratory values were ordered for the patient including screening of hyperlipidemia, renal function and hepatic function. If indicated by BPH, a PSA was ordered.  Medication reconciliation,  past medical history, social history, problem list and allergies were reviewed in detail with the patient  Goals were established with regard to weight loss, exercise, and  diet in compliance with medications Wt Readings from Last 3 Encounters:  09/23/20 190 lb (86.2 kg)  08/04/20 192 lb 11.2 oz (87.4 kg)  06/11/20 180 lb (81.6 kg)   He is up-to-date on routine colon cancer screening  Review of Systems  Constitutional: Negative.   HENT: Negative.    Eyes: Negative.   Respiratory: Negative.    Cardiovascular: Negative.   Gastrointestinal: Negative.   Endocrine: Negative.   Genitourinary: Negative.   Musculoskeletal: Negative.   Skin: Negative.   Allergic/Immunologic: Negative.   Neurological: Negative.   Hematological: Negative.   Psychiatric/Behavioral: Negative.    All other systems reviewed and are negative.  Past Medical History:  Diagnosis Date   Carotid artery calcification 03/2011   MILD HARD PLAQUE LEFT BULB, 0-39% BIL. ICA STENOSIS, PATENT VERTEBRAL ARTERIES WITH ANTEGRADE FLOW   Diabetes type 2, controlled ([place])    Diverticulitis    Fatty liver    FH: kidney cancer 2005   right kidney removed    GERD (gastroesophageal reflux disease)    Gout    History of kidney stones    Left kidney   HTN (hypertension)    Hyperlipidemia    OA (osteoarthritis)    Knee  OSA (obstructive sleep apnea) 04/18/2016   SEVERE; UNABLE TO WEAR CPAP MORE THAN 2-3 HOURS PER NIGHT   Plantar fasciitis    Prostate cancer ([place])    Seasonal allergies     Social History   Socioeconomic History   Marital status: Married    Spouse name: Not on file   Number of children: Not on file   Years of education: Not on file   Highest education level: Not on file   Occupational History   Occupation: retired    Comment: Physiological scientist   Occupation: part time    Comment: golf course  Tobacco Use   Smoking status: Never   Smokeless tobacco: Never  Vaping Use   Vaping Use: Never used  Substance and Sexual Activity   Alcohol use: Yes    Alcohol/week: 1.0 standard drink    Types: 1 Cans of beer per week    Comment: rare   Drug use: No   Sexual activity: Yes  Other Topics Concern   Not on file  Social History Narrative   HH 2   Married wife with compromised immune system   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 - > 5 miles per day   Social Determinants of Radio broadcast assistant Strain: Low Risk    Difficulty of Paying Living Expenses: Not hard at all  Food Insecurity: No Food Insecurity   Worried About Charity fundraiser in the Last Year: Never true   Arboriculturist in the Last Year: Never true  Transportation Needs: No Transportation Needs   Lack of Transportation (Medical): No   Lack of Transportation (Non-Medical): No  Physical Activity: Sufficiently Active   Days of Exercise per Week: 5 days   Minutes of Exercise per Session: 30 min  Stress: No Stress Concern Present   Feeling of Stress : Not at all  Social Connections: Moderately Integrated   Frequency of Communication with Friends and Family: More than three times a week   Frequency of Social Gatherings with Friends and Family: More than three times a week   Attends Religious Services: Never   Marine scientist or Organizations: Yes   Attends Archivist Meetings: 1 to 4 times per year   Marital Status: Married  Human resources officer Violence: Not At Risk   Fear of Current or Ex-Partner: No   Emotionally Abused: No   Physically Abused: No   Sexually Abused: No    Past Surgical History:  Procedure Laterality Date   COLON SURGERY  2011   Partial colon removed due to diverticulitis   COLONOSCOPY  08/2009   NEPHRECTOMY  Right    PROSTATE BIOPSY     RADIOACTIVE SEED IMPLANT N/A 08/03/2017   Procedure: RADIOACTIVE SEED IMPLANT/BRACHYTHERAPY IMPLANT;  Surgeon: Kathie Rhodes, MD;  Location: [facility];  Service: Urology;  Laterality: N/A;   SPACE OAR INSTILLATION N/A 08/03/2017   Procedure: SPACE OAR INSTILLATION;  Surgeon: Kathie Rhodes, MD;  Location: [facility];  Service: Urology;  Laterality: N/A;    Family History  Problem Relation Age of Onset   Heart disease Mother        heart failure - 70 and 43 when she passed    Stroke Mother    Cancer Father        wagners   Glaucoma Father        wagoner's glaucoma   Hypertension Other  multiple family members    Allergies  Allergen Reactions   Oxycodone Other (See Comments)    Patient stated he has nightmares with oxycodone    Current Outpatient Medications on File Prior to Visit  Medication Sig Dispense Refill   allopurinol (ZYLOPRIM) 300 MG tablet TAKE 1 TABLET(300 MG) BY MOUTH DAILY 90 tablet 2   amLODipine (NORVASC) 10 MG tablet TAKE 1 TABLET(10 MG) BY MOUTH DAILY 90 tablet 1   aspirin 81 MG chewable tablet Chew by mouth.     cetirizine (ZYRTEC) 10 MG tablet Take 10 mg by mouth daily.     Cholecalciferol (VITAMIN D) 125 MCG (5000 UT) CAPS Take by mouth daily.     Docusate Calcium ([employer] STOOL SOFTENER PO) Take by mouth daily.     hydrochlorothiazide (HYDRODIURIL) 12.5 MG tablet TAKE 1 TABLET BY MOUTH DAILY 90 tablet 3   losartan (COZAAR) 25 MG tablet TAKE 1 TABLET BY MOUTH DAILY 90 tablet 3   metFORMIN (GLUCOPHAGE) 500 MG tablet TAKE 1 TABLET BY MOUTH BEFORE BREAKFAST 90 tablet 0   Misc Natural Products (FIBER 7 PO) Take 2 tablets by mouth daily.      Multiple Vitamins-Minerals (DAILY MULTI 50+ PO) Take 1 tablet by mouth daily.     simvastatin (ZOCOR) 20 MG tablet TAKE 1 TABLET(20 MG) BY MOUTH DAILY 90 tablet 1   tamsulosin (FLOMAX) 0.4 MG CAPS capsule Take 0.4 mg by mouth daily.     No current  facility-administered medications on file prior to visit.    BP 102/70   Pulse 90   Temp 98 F (36.7 C) (Oral)   Ht '5\' 9"'$  (1.753 m)   Wt 190 lb (86.2 kg)   SpO2 96%   BMI 28.06 kg/m       Objective:   Physical Exam Vitals and nursing note reviewed.  Constitutional:      General: He is not in acute distress.    Appearance: Normal appearance. He is well-developed and normal weight.  HENT:     Head: Normocephalic and atraumatic.     Right Ear: Tympanic membrane, ear canal and external ear normal. There is no impacted cerumen.     Left Ear: Tympanic membrane, ear canal and external ear normal. There is no impacted cerumen.     Nose: Nose normal. No congestion or rhinorrhea.     Mouth/Throat:     Mouth: Mucous membranes are moist.     Pharynx: Oropharynx is clear. No oropharyngeal exudate or posterior oropharyngeal erythema.  Eyes:     General:        Right eye: No discharge.        Left eye: No discharge.     Extraocular Movements: Extraocular movements intact.     Conjunctiva/sclera: Conjunctivae normal.     Pupils: Pupils are equal, round, and reactive to light.  Neck:     Vascular: No carotid bruit.     Trachea: No tracheal deviation.  Cardiovascular:     Rate and Rhythm: Normal rate and regular rhythm.     Pulses: Normal pulses.     Heart sounds: Normal heart sounds. No murmur heard.   No friction rub. No gallop.  Pulmonary:     Effort: Pulmonary effort is normal. No respiratory distress.     Breath sounds: Normal breath sounds. No stridor. No wheezing, rhonchi or rales.  Chest:     Chest wall: No tenderness.  Abdominal:     General: Bowel sounds are normal. There is no  distension.     Palpations: Abdomen is soft. There is no mass.     Tenderness: There is no abdominal tenderness. There is no right CVA tenderness, left CVA tenderness, guarding or rebound.     Hernia: No hernia is present.  Musculoskeletal:        General: No swelling, tenderness, deformity or  signs of injury. Normal range of motion.     Right lower leg: No edema.     Left lower leg: No edema.  Lymphadenopathy:     Cervical: No cervical adenopathy.  Skin:    General: Skin is warm and dry.     Capillary Refill: Capillary refill takes less than 2 seconds.     Coloration: Skin is not jaundiced or pale.     Findings: No bruising, erythema, lesion or rash.  Neurological:     General: No focal deficit present.     Mental Status: He is alert and oriented to person, place, and time.     Cranial Nerves: No cranial nerve deficit.     Sensory: No sensory deficit.     Motor: No weakness.     Coordination: Coordination normal.     Gait: Gait normal.     Deep Tendon Reflexes: Reflexes normal.  Psychiatric:        Mood and Affect: Mood normal.        Behavior: Behavior normal.        Thought Content: Thought content normal.        Judgment: Judgment normal.      Assessment & Plan:  1. Routine general medical examination at a health care facility - Continue to stay active and exercise - Follow up in one year or sooner if needed - CBC with Differential/Platelet; Future - Comprehensive metabolic panel; Future - Hemoglobin A1c; Future - Lipid panel; Future - TSH; Future  2. Type 2 diabetes mellitus with other specified complication, without long-term current use of insulin (HCC) - Consider increase in Metformin  - CBC with Differential/Platelet; Future - Comprehensive metabolic panel; Future - Hemoglobin A1c; Future - Lipid panel; Future - TSH; Future  3. Hyperlipidemia, unspecified hyperlipidemia type - Consider changing statin to high intensity  - CBC with Differential/Platelet; Future - Comprehensive metabolic panel; Future - Hemoglobin A1c; Future - Lipid panel; Future - TSH; Future  4. Essential hypertension - Well controlled. No change in medications  - CBC with Differential/Platelet; Future - Comprehensive metabolic panel; Future - Hemoglobin A1c; Future -  Lipid panel; Future - TSH; Future  5. History of prostate cancer  - PSA; Future  6. Acute gout, unspecified cause, unspecified site - Continue allopurinol   Dorothyann Peng, NP

## 2020-09-24 ENCOUNTER — Telehealth: Payer: Self-pay

## 2020-09-24 NOTE — Telephone Encounter (Signed)
Noted  

## 2020-09-24 NOTE — Telephone Encounter (Signed)
Patient returned call and was informed of results pt verbalized understanding and scheduled 6 month follow up

## 2020-11-08 ENCOUNTER — Other Ambulatory Visit: Payer: Self-pay

## 2020-11-09 ENCOUNTER — Ambulatory Visit (INDEPENDENT_AMBULATORY_CARE_PROVIDER_SITE_OTHER): Payer: Medicare Other

## 2020-11-09 DIAGNOSIS — Z23 Encounter for immunization: Secondary | ICD-10-CM

## 2020-11-21 ENCOUNTER — Other Ambulatory Visit: Payer: Self-pay | Admitting: Adult Health

## 2020-11-24 ENCOUNTER — Other Ambulatory Visit: Payer: Self-pay | Admitting: Adult Health

## 2020-12-17 LAB — PSA: PSA: 0.046

## 2020-12-28 DIAGNOSIS — R350 Frequency of micturition: Secondary | ICD-10-CM | POA: Diagnosis not present

## 2020-12-28 DIAGNOSIS — R3915 Urgency of urination: Secondary | ICD-10-CM | POA: Diagnosis not present

## 2021-02-04 ENCOUNTER — Telehealth: Payer: Self-pay

## 2021-02-04 NOTE — Telephone Encounter (Signed)
--  Caller states he has a cough with congestion that started 3 days ago, cough comes and goes, cough is worse at night, runny nose comes and goes, drinking fluids and voiding denies fever, CP/Pressure and other symptoms, tested negative for covid today via home test, requesting medication,  02/04/2021 2:06:27 PM See PCP within 24 Hours Scott, RN, Malachy Mood  Comments User: Burna Sis, RN Date/Time Eilene Ghazi Time): 02/04/2021 2:07:29 PM Warm transferred to appt desk and instructed to let them know he has spoken with triage nurse and will need to be seen in next 24 hours,  Referrals REFERRED TO PCP OFFICE  02/04/21 1500: Offered to give instructions on how to set up VV through mychart or that pt can visit an UC. Pt opts for UC.

## 2021-02-09 ENCOUNTER — Ambulatory Visit: Payer: Medicare Other | Admitting: Dietician

## 2021-02-17 DIAGNOSIS — L821 Other seborrheic keratosis: Secondary | ICD-10-CM | POA: Diagnosis not present

## 2021-02-17 DIAGNOSIS — C44519 Basal cell carcinoma of skin of other part of trunk: Secondary | ICD-10-CM | POA: Diagnosis not present

## 2021-02-17 DIAGNOSIS — D2272 Melanocytic nevi of left lower limb, including hip: Secondary | ICD-10-CM | POA: Diagnosis not present

## 2021-02-17 DIAGNOSIS — C44612 Basal cell carcinoma of skin of right upper limb, including shoulder: Secondary | ICD-10-CM | POA: Diagnosis not present

## 2021-02-17 DIAGNOSIS — D485 Neoplasm of uncertain behavior of skin: Secondary | ICD-10-CM | POA: Diagnosis not present

## 2021-02-17 DIAGNOSIS — D1801 Hemangioma of skin and subcutaneous tissue: Secondary | ICD-10-CM | POA: Diagnosis not present

## 2021-02-17 DIAGNOSIS — D225 Melanocytic nevi of trunk: Secondary | ICD-10-CM | POA: Diagnosis not present

## 2021-02-17 DIAGNOSIS — D0461 Carcinoma in situ of skin of right upper limb, including shoulder: Secondary | ICD-10-CM | POA: Diagnosis not present

## 2021-02-17 DIAGNOSIS — Z85828 Personal history of other malignant neoplasm of skin: Secondary | ICD-10-CM | POA: Diagnosis not present

## 2021-02-19 ENCOUNTER — Other Ambulatory Visit: Payer: Self-pay | Admitting: Adult Health

## 2021-02-20 ENCOUNTER — Other Ambulatory Visit: Payer: Self-pay | Admitting: Adult Health

## 2021-02-20 DIAGNOSIS — I10 Essential (primary) hypertension: Secondary | ICD-10-CM

## 2021-02-20 DIAGNOSIS — E785 Hyperlipidemia, unspecified: Secondary | ICD-10-CM

## 2021-02-23 ENCOUNTER — Other Ambulatory Visit: Payer: Self-pay | Admitting: Adult Health

## 2021-03-30 ENCOUNTER — Encounter: Payer: Self-pay | Admitting: Adult Health

## 2021-03-30 ENCOUNTER — Ambulatory Visit (INDEPENDENT_AMBULATORY_CARE_PROVIDER_SITE_OTHER): Payer: Medicare Other | Admitting: Adult Health

## 2021-03-30 ENCOUNTER — Ambulatory Visit: Payer: Medicare Other | Admitting: Adult Health

## 2021-03-30 VITALS — BP 122/84 | HR 75 | Temp 97.8°F | Ht 69.0 in | Wt 198.0 lb

## 2021-03-30 DIAGNOSIS — E139 Other specified diabetes mellitus without complications: Secondary | ICD-10-CM | POA: Diagnosis not present

## 2021-03-30 DIAGNOSIS — I10 Essential (primary) hypertension: Secondary | ICD-10-CM

## 2021-03-30 LAB — POCT GLYCOSYLATED HEMOGLOBIN (HGB A1C): Hemoglobin A1C: 6.9 % — AB (ref 4.0–5.6)

## 2021-03-30 MED ORDER — ALLOPURINOL 300 MG PO TABS: ORAL_TABLET | ORAL | 3 refills | Status: DC

## 2021-03-30 NOTE — Progress Notes (Signed)
Subjective:    Patient ID: Jacob Sermons., male    DOB: 07/27/1950, 71 y.o.   MRN: [id number]  HPI  71 year old male who  has a past medical history of Carotid artery calcification (03/2011), Diabetes type 2, controlled ([place]), Diverticulitis, Fatty liver, FH: kidney cancer (2005), GERD (gastroesophageal reflux disease), Gout, History of kidney stones, HTN (hypertension), Hyperlipidemia, OA (osteoarthritis), OSA (obstructive sleep apnea) (04/18/2016), Plantar fasciitis, Prostate cancer ([place]), and Seasonal allergies.  He presents to the office today for 52-month follow-up regarding diabetes and hypertension  Diabetes mellitus Type 2-is prescribed metformin 500 mg daily.  He does not monitor his blood sugars at home.  Denies signs or symptoms of hypoglycemia.  His A1c has been well controlled for a number of years.  He was not very active during the winter but plans on getting back outside and walking and working in the yard.  Lab Results  Component Value Date   HGBA1C 6.9 (A) 03/30/2021   HTN -prescribed Norvasc 10 mg daily, HCTZ 25 mg daily, and Cozaar 25 mg daily.  He does monitor his blood pressures at home periodically reports readings in the 120s to 130s over 70s to 80s.  He denies episodes of dizziness, lightheadedness, shortness of breath, headache, or chest pain BP Readings from Last 3 Encounters:  03/30/21 122/84  09/23/20 102/70  07/15/20 132/75   Wt Readings from Last 3 Encounters:  03/30/21 198 lb (89.8 kg)  09/23/20 191 lb 9.6 oz (86.9 kg)  09/23/20 190 lb (86.2 kg)     Review of Systems See HPI   Past Medical History:  Diagnosis Date   Carotid artery calcification 03/2011   MILD HARD PLAQUE LEFT BULB, 0-39% BIL. ICA STENOSIS, PATENT VERTEBRAL ARTERIES WITH ANTEGRADE FLOW   Diabetes type 2, controlled ([place])    Diverticulitis    Fatty liver    FH: kidney cancer 2005   right kidney removed    GERD (gastroesophageal reflux disease)    Gout    History of kidney  stones    Left kidney   HTN (hypertension)    Hyperlipidemia    OA (osteoarthritis)    Knee   OSA (obstructive sleep apnea) 04/18/2016   SEVERE; UNABLE TO WEAR CPAP MORE THAN 2-3 HOURS PER NIGHT   Plantar fasciitis    Prostate cancer ([place])    Seasonal allergies     Social History   Socioeconomic History   Marital status: Married    Spouse name: Not on file   Number of children: Not on file   Years of education: Not on file   Highest education level: Not on file  Occupational History   Occupation: retired    Comment: Physiological scientist   Occupation: part time    Comment: golf course  Tobacco Use   Smoking status: Never   Smokeless tobacco: Never  Vaping Use   Vaping Use: Never used  Substance and Sexual Activity   Alcohol use: Yes    Alcohol/week: 1.0 standard drink    Types: 1 Cans of beer per week    Comment: rare   Drug use: No   Sexual activity: Yes  Other Topics Concern   Not on file  Social History Narrative   HH 2   Married wife with compromised immune system   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 - > 5 miles per day   Social Determinants of Radio broadcast assistant Strain: Low  Risk    Difficulty of Paying Living Expenses: Not hard at all  Food Insecurity: No Food Insecurity   Worried About [place] in the Last Year: Never true   Ran Out of Food in the Last Year: Never true  Transportation Needs: No Transportation Needs   Lack of Transportation (Medical): No   Lack of Transportation (Non-Medical): No  Physical Activity: Sufficiently Active   Days of Exercise per Week: 5 days   Minutes of Exercise per Session: 30 min  Stress: No Stress Concern Present   Feeling of Stress : Not at all  Social Connections: Moderately Integrated   Frequency of Communication with Friends and Family: More than three times a week   Frequency of Social Gatherings with Friends and Family: More than three times a week   Attends  Religious Services: Never   Marine scientist or Organizations: Yes   Attends Archivist Meetings: 1 to 4 times per year   Marital Status: Married  Human resources officer Violence: Not At Risk   Fear of Current or Ex-Partner: No   Emotionally Abused: No   Physically Abused: No   Sexually Abused: No    Past Surgical History:  Procedure Laterality Date   COLON SURGERY  2011   Partial colon removed due to diverticulitis   COLONOSCOPY  08/2009   NEPHRECTOMY Right    PROSTATE BIOPSY     RADIOACTIVE SEED IMPLANT N/A 08/03/2017   Procedure: RADIOACTIVE SEED IMPLANT/BRACHYTHERAPY IMPLANT;  Surgeon: Kathie Rhodes, MD;  Location: [facility];  Service: Urology;  Laterality: N/A;   SPACE OAR INSTILLATION N/A 08/03/2017   Procedure: SPACE OAR INSTILLATION;  Surgeon: Kathie Rhodes, MD;  Location: [facility];  Service: Urology;  Laterality: N/A;    Family History  Problem Relation Age of Onset   Heart disease Mother        heart failure - 71 and 44 when she passed    Stroke Mother    Cancer Father        wagners   Glaucoma Father        wagoner's glaucoma   Hypertension Other        multiple family members    Allergies  Allergen Reactions   Oxycodone Other (See Comments)    Patient stated he has nightmares with oxycodone    Current Outpatient Medications on File Prior to Visit  Medication Sig Dispense Refill   amLODipine (NORVASC) 10 MG tablet TAKE 1 TABLET(10 MG) BY MOUTH DAILY 90 tablet 3   aspirin 81 MG chewable tablet Chew by mouth.     cetirizine (ZYRTEC) 10 MG tablet Take 10 mg by mouth daily.     Cholecalciferol (VITAMIN D) 125 MCG (5000 UT) CAPS Take by mouth daily.     Docusate Calcium ([employer]S STOOL SOFTENER PO) Take by mouth daily.     hydrochlorothiazide (HYDRODIURIL) 12.5 MG tablet TAKE 1 TABLET BY MOUTH DAILY 90 tablet 3   losartan (COZAAR) 25 MG tablet TAKE 1 TABLET BY MOUTH DAILY 90 tablet 3   metFORMIN (GLUCOPHAGE) 500 MG tablet  TAKE 1 TABLET BY MOUTH BEFORE BREAKFAST 90 tablet 0   Misc Natural Products (FIBER 7 PO) Take 2 tablets by mouth daily.      Multiple Vitamins-Minerals (DAILY MULTI 50+ PO) Take 1 tablet by mouth daily.     simvastatin (ZOCOR) 20 MG tablet TAKE 1 TABLET(20 MG) BY MOUTH DAILY 90 tablet 3   tamsulosin (FLOMAX) 0.4 MG  CAPS capsule Take 0.4 mg by mouth daily.     No current facility-administered medications on file prior to visit.    BP 122/84    Pulse 75    Temp 97.8 F (36.6 C) (Oral)    Ht 5\' 9"  (1.753 m)    Wt 198 lb (89.8 kg)    SpO2 95%    BMI 29.24 kg/m       Objective:   Physical Exam Vitals and nursing note reviewed.  Constitutional:      Appearance: Normal appearance.  Cardiovascular:     Rate and Rhythm: Normal rate and regular rhythm.     Pulses: Normal pulses.     Heart sounds: Normal heart sounds.  Pulmonary:     Effort: Pulmonary effort is normal.     Breath sounds: Normal breath sounds.  Skin:    General: Skin is warm and dry.     Capillary Refill: Capillary refill takes less than 2 seconds.  Neurological:     General: No focal deficit present.     Mental Status: He is alert and oriented to person, place, and time.  Psychiatric:        Mood and Affect: Mood normal.        Behavior: Behavior normal.        Thought Content: Thought content normal.        Judgment: Judgment normal.      Assessment & Plan:  1. Diabetes 1.5, managed as type 2 ([place])  - POC HgB A1c- 6.9 - at goall  - No change in medication therapy  - Follow up in 6 months for CPE   2. Essential hypertension - No change in medication  - Well controlled.   Dorothyann Peng, NP

## 2021-03-30 NOTE — Patient Instructions (Signed)
Health Maintenance Due  Topic Date Due   COVID-19 Vaccine (5 - Booster for Pfizer series) 07/28/2020   Zoster Vaccines- Shingrix (2 of 2) 08/18/2020    Depression screen [facility] 2/9 09/23/2020 08/04/2020 06/11/2020  Decreased Interest 0 0 0  Down, Depressed, Hopeless 0 0 0  PHQ - 2 Score 0 0 0

## 2021-04-27 DIAGNOSIS — H524 Presbyopia: Secondary | ICD-10-CM | POA: Diagnosis not present

## 2021-04-27 DIAGNOSIS — E119 Type 2 diabetes mellitus without complications: Secondary | ICD-10-CM | POA: Diagnosis not present

## 2021-04-27 DIAGNOSIS — H2513 Age-related nuclear cataract, bilateral: Secondary | ICD-10-CM | POA: Diagnosis not present

## 2021-04-27 DIAGNOSIS — H25013 Cortical age-related cataract, bilateral: Secondary | ICD-10-CM | POA: Diagnosis not present

## 2021-05-18 ENCOUNTER — Ambulatory Visit: Payer: Medicare Other | Admitting: Orthopaedic Surgery

## 2021-05-19 ENCOUNTER — Encounter: Payer: Self-pay | Admitting: Orthopaedic Surgery

## 2021-05-19 ENCOUNTER — Ambulatory Visit (INDEPENDENT_AMBULATORY_CARE_PROVIDER_SITE_OTHER): Payer: Medicare Other

## 2021-05-19 ENCOUNTER — Ambulatory Visit: Payer: Medicare Other | Admitting: Orthopaedic Surgery

## 2021-05-19 DIAGNOSIS — M25521 Pain in right elbow: Secondary | ICD-10-CM

## 2021-05-19 DIAGNOSIS — M7711 Lateral epicondylitis, right elbow: Secondary | ICD-10-CM

## 2021-05-19 MED ORDER — LIDOCAINE HCL 1 % IJ SOLN
1.0000 mL | INTRAMUSCULAR | Status: AC | PRN
  Administered 2021-05-19: 1 mL

## 2021-05-19 MED ORDER — METHYLPREDNISOLONE ACETATE 40 MG/ML IJ SUSP
20.0000 mg | INTRAMUSCULAR | Status: AC | PRN
  Administered 2021-05-19: 20 mg

## 2021-05-19 NOTE — Progress Notes (Signed)
? ?Office Visit Note ?  ?Patient: Jacob Stone.           ?Date of Birth: [birth year]           ?MRN: [id number] ?Visit Date: 05/19/2021 ?             ?Requested by: Dorothyann Peng, NP ?[address] ?[place],  [place] [address] ?PCP: Dorothyann Peng, NP ? ? ?Assessment & Plan: ?Visit Diagnoses:  ?1. Pain in right elbow   ?2. Right tennis elbow   ? ? ?Plan: [name] experienced insidious onset of lateral right elbow pain several weeks ago without history of injury or trauma.  He has not noticed any swelling or skin changes however he does have an issue with grip.  Seems to be worse with his elbow in extension and with flexion and always localized along the lateral epicondyles.  His symptoms are certainly consistent with lateral epicondylitis or tennis elbow.  I am going to inject it with Depo-Medrol, apply a tennis elbow splint and give him a set of tennis elbow exercises.  We will plan to see him back as needed.  He has tried some over-the-counter medicines and rubs and not sure its made much of a difference and it certainly has been functionally limiting ? ?Follow-Up Instructions: Return if symptoms worsen or fail to improve.  ? ?Orders:  ?Orders Placed This Encounter  ?Procedures  ? XR Elbow 2 Views Right  ? ?No orders of the defined types were placed in this encounter. ? ? ? ? Procedures: ?Hand/UE Inj: R elbow for lateral epicondylitis on 05/19/2021 1:46 PM ?Medications: 1 mL lidocaine 1 %; 20 mg methylPREDNISolone acetate 40 MG/ML ? ? ? ? ?Clinical Data: ?No additional findings. ? ? ?Subjective: ?Chief Complaint  ?Patient presents with  ? Right Elbow - Pain  ?Patient presents today for right elbow pain. He said that it started hurting two weeks ago. No known injury. The pain is located laterally. He said that any lifting or twisting causes more pain. He is right hand dominant. He is not taking anything for pain.  ? ?HPI ? ?Review of Systems ? ? ?Objective: ?Vital Signs: There were no vitals taken for this  visit. ? ?Physical Exam ?Constitutional:   ?   Appearance: He is well-developed.  ?Eyes:  ?   Pupils: Pupils are equal, round, and reactive to light.  ?Pulmonary:  ?   Effort: Pulmonary effort is normal.  ?Skin: ?   General: Skin is warm and dry.  ?Neurological:  ?   Mental Status: He is alert and oriented to person, place, and time.  ?Psychiatric:     ?   Behavior: Behavior normal.  ? ? ?Ortho Exam awake alert and oriented x3.  Comfortable sitting.  Right elbow with local tenderness over the lateral epicondyles.  More pain with grip in extension and flexion.  Skin intact.  No ecchymosis or erythema.  No obvious swelling.  No olecranon pain.  Full pronation supination flexion extension of right elbow compared to left no discomfort over the biceps tendon.  Neurologically intact ? ?Specialty Comments:  ?No specialty comments available. ? ?Imaging: ?XR Elbow 2 Views Right ? ?Result Date: 05/19/2021 ?Films of the right elbow obtained in 2 projections.  No acute changes.  Negative fat pad.  No obvious degenerative changes in the glenohumeral joint.  There is very minimal ectopic calcification about the lateral epicondyles the patient is experiencing pain consistent with tennis elbow  ? ? ?PMFS  History: ?Patient Active Problem List  ? Diagnosis Date Noted  ? Right tennis elbow 05/19/2021  ? History of prostate cancer 09/23/2020  ? Low back pain 05/21/2019  ? Malignant neoplasm of prostate ([place]) 04/16/2017  ? Unilateral primary osteoarthritis, left knee 11/16/2016  ? OSA (obstructive sleep apnea) 04/18/2016  ? Sleep dysfunction with arousal disturbance 01/17/2016  ? Hyperlipidemia 09/14/2014  ? Diabetes 1.5, managed as type 2 ([place]) 07/07/2014  ? Acute gout 04/09/2014  ? Carotid artery calcification 03/02/2011  ? Malignant neoplasm of kidney excluding renal pelvis ([place]) 11/14/2007  ? ALLERGIC RHINITIS 11/14/2007  ? DIVERTICULOSIS, COLON 04/16/2007  ? Essential hypertension 04/16/2007  ? ?Past Medical History:  ?Diagnosis  Date  ? Carotid artery calcification 03/2011  ? MILD HARD PLAQUE LEFT BULB, 0-39% BIL. ICA STENOSIS, PATENT VERTEBRAL ARTERIES WITH ANTEGRADE FLOW  ? Diabetes type 2, controlled ([place])   ? Diverticulitis   ? Fatty liver   ? FH: kidney cancer 2005  ? right kidney removed   ? GERD (gastroesophageal reflux disease)   ? Gout   ? History of kidney stones   ? Left kidney  ? HTN (hypertension)   ? Hyperlipidemia   ? OA (osteoarthritis)   ? Knee  ? OSA (obstructive sleep apnea) 04/18/2016  ? SEVERE; UNABLE TO WEAR CPAP MORE THAN 2-3 HOURS PER NIGHT  ? Plantar fasciitis   ? Prostate cancer ([place])   ? Seasonal allergies   ?  ?Family History  ?Problem Relation Age of Onset  ? Heart disease Mother   ?     heart failure - 13 and 9 when she passed   ? Stroke Mother   ? Cancer Father   ?     wagners  ? Glaucoma Father   ?     wagoner's glaucoma  ? Hypertension Other   ?     multiple family members  ?  ?Past Surgical History:  ?Procedure Laterality Date  ? COLON SURGERY  2011  ? Partial colon removed due to diverticulitis  ? COLONOSCOPY  08/2009  ? NEPHRECTOMY Right   ? PROSTATE BIOPSY    ? RADIOACTIVE SEED IMPLANT N/A 08/03/2017  ? Procedure: RADIOACTIVE SEED IMPLANT/BRACHYTHERAPY IMPLANT;  Surgeon: Kathie Rhodes, MD;  Location: [facility];  Service: Urology;  Laterality: N/A;  ? SPACE OAR INSTILLATION N/A 08/03/2017  ? Procedure: SPACE OAR INSTILLATION;  Surgeon: Kathie Rhodes, MD;  Location: [facility];  Service: Urology;  Laterality: N/A;  ? ?Social History  ? ?Occupational History  ? 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 Smoking status: Never  ? Smokeless tobacco: Never  ?Vaping Use  ? Vaping Use: Never used  ?Substance and Sexual Activity  ? Alcohol use: Yes  ?  Alcohol/week: 1.0 standard drink  ?  Types: 1 Cans of beer per week  ?  Comment: rare  ? Drug use: No  ? Sexual activity: Yes  ? ? ? ? ? ? ?

## 2021-05-21 ENCOUNTER — Other Ambulatory Visit: Payer: Self-pay | Admitting: Adult Health

## 2021-05-23 ENCOUNTER — Telehealth: Payer: Self-pay | Admitting: Adult Health

## 2021-05-23 NOTE — Telephone Encounter (Signed)
Pt call back and want you to know that he is out of medication and want a call back. ?

## 2021-05-25 ENCOUNTER — Other Ambulatory Visit: Payer: Self-pay

## 2021-05-25 MED ORDER — METFORMIN HCL 500 MG PO TABS: ORAL_TABLET | ORAL | 0 refills | Status: DC

## 2021-05-25 NOTE — Telephone Encounter (Addendum)
Called pt to see which medication was needed for refill. Pr stated that he is out of Metformin and needed it. I refilled metformin. No further action needed! Pt aware of update.  ?

## 2021-05-26 ENCOUNTER — Other Ambulatory Visit: Payer: Self-pay | Admitting: Adult Health

## 2021-06-02 ENCOUNTER — Ambulatory Visit: Payer: Medicare Other | Admitting: Orthopaedic Surgery

## 2021-06-02 ENCOUNTER — Encounter: Payer: Self-pay | Admitting: Orthopaedic Surgery

## 2021-06-02 DIAGNOSIS — M65332 Trigger finger, left middle finger: Secondary | ICD-10-CM | POA: Diagnosis not present

## 2021-06-02 MED ORDER — LIDOCAINE HCL 1 % IJ SOLN
0.5000 mL | INTRAMUSCULAR | Status: AC | PRN
  Administered 2021-06-02: .5 mL

## 2021-06-02 MED ORDER — METHYLPREDNISOLONE ACETATE 40 MG/ML IJ SUSP
10.0000 mg | INTRAMUSCULAR | Status: AC | PRN
  Administered 2021-06-02: 10 mg

## 2021-06-02 NOTE — Progress Notes (Signed)
? ?Office Visit Note ?  ?Patient: Jacob Stone.           ?Date of Birth: 05/06/1950           ?MRN: [id number] ?Visit Date: 06/02/2021 ?             ?Requested by: Dorothyann Peng, NP ?[address] ?[place],  [place] [id number] ?PCP: Dorothyann Peng, NP ? ? ?Assessment & Plan: ?Visit Diagnoses:  ?1. Trigger finger, left middle finger   ? ? ?Plan: Mr. Chiasson was seen last week for right tennis elbow.  I injected the lateral epicondyle relates it made a big difference and no longer is having any pain.  He does have a left long trigger finger and I suggested he come back today for cortisone injection.  That was performed without difficulty plan to see him back as needed ? ?Follow-Up Instructions: Return if symptoms worsen or fail to improve.  ? ?Orders:  ?No orders of the defined types were placed in this encounter. ? ?No orders of the defined types were placed in this encounter. ? ? ? ? Procedures: ?Hand/UE Inj: L long A1 for trigger finger on 06/02/2021 2:42 PM ?Indications: pain ?Details: 37 G needle ?Medications: 0.5 mL lidocaine 1 %; 10 mg methylPREDNISolone acetate 40 MG/ML ? ? ? ? ?Clinical Data: ?No additional findings. ? ? ?Subjective: ?Chief Complaint  ?Patient presents with  ? Right Elbow - Pain, Follow-up  ? Left Middle Finger - Pain  ?Recent cortisone injection for right tennis elbow made a big difference and no longer has any discomfort.  Still having active triggering of the left long finger and wanted to have a cortisone injection ? ?HPI ? ?Review of Systems ? ? ?Objective: ?Vital Signs: There were no vitals taken for this visit. ? ?Physical Exam ?Constitutional:   ?   Appearance: He is well-developed.  ?Eyes:  ?   Pupils: Pupils are equal, round, and reactive to light.  ?Pulmonary:  ?   Effort: Pulmonary effort is normal.  ?Skin: ?   General: Skin is warm and dry.  ?Neurological:  ?   Mental Status: He is alert and oriented to person, place, and time.  ?Psychiatric:     ?   Behavior: Behavior  normal.  ? ? ?Ortho Exam left hand with active triggering of the long finger with a palpable nodule on the palmar aspect near the metacarpal phalangeal joint.  Neurologically intact.  No finger swelling.  Normal capillary refill to fingers ? ?Specialty Comments:  ?No specialty comments available. ? ?Imaging: ?No results found. ? ? ?PMFS History: ?Patient Active Problem List  ? Diagnosis Date Noted  ? Trigger finger, left middle finger 06/02/2021  ? Right tennis elbow 05/19/2021  ? History of prostate cancer 09/23/2020  ? Low back pain 05/21/2019  ? Malignant neoplasm of prostate ([place]) 04/16/2017  ? Unilateral primary osteoarthritis, left knee 11/16/2016  ? OSA (obstructive sleep apnea) 04/18/2016  ? Sleep dysfunction with arousal disturbance 01/17/2016  ? Hyperlipidemia 09/14/2014  ? Diabetes 1.5, managed as type 2 ([place]) 07/07/2014  ? Acute gout 04/09/2014  ? Carotid artery calcification 03/02/2011  ? Malignant neoplasm of kidney excluding renal pelvis ([place]) 11/14/2007  ? ALLERGIC RHINITIS 11/14/2007  ? DIVERTICULOSIS, COLON 04/16/2007  ? Essential hypertension 04/16/2007  ? ?Past Medical History:  ?Diagnosis Date  ? Carotid artery calcification 03/2011  ? MILD HARD PLAQUE LEFT BULB, 0-39% BIL. ICA STENOSIS, PATENT VERTEBRAL ARTERIES WITH ANTEGRADE FLOW  ? Diabetes type  2, controlled ([place])   ? Diverticulitis   ? Fatty liver   ? FH: kidney cancer 2005  ? right kidney removed   ? GERD (gastroesophageal reflux disease)   ? Gout   ? History of kidney stones   ? Left kidney  ? HTN (hypertension)   ? Hyperlipidemia   ? OA (osteoarthritis)   ? Knee  ? OSA (obstructive sleep apnea) 04/18/2016  ? SEVERE; UNABLE TO WEAR CPAP MORE THAN 2-3 HOURS PER NIGHT  ? Plantar fasciitis   ? Prostate cancer ([place])   ? Seasonal allergies   ?  ?Family History  ?Problem Relation Age of Onset  ? Heart disease Mother   ?     heart failure - 32 and 6 when she passed   ? Stroke Mother   ? Cancer Father   ?     wagners  ? Glaucoma Father   ?      wagoner's glaucoma  ? Hypertension Other   ?     multiple family members  ?  ?Past Surgical History:  ?Procedure Laterality Date  ? COLON SURGERY  2011  ? Partial colon removed due to diverticulitis  ? COLONOSCOPY  08/2009  ? NEPHRECTOMY Right   ? PROSTATE BIOPSY    ? RADIOACTIVE SEED IMPLANT N/A 08/03/2017  ? Procedure: RADIOACTIVE SEED IMPLANT/BRACHYTHERAPY IMPLANT;  Surgeon: Kathie Rhodes, MD;  Location: [facility];  Service: Urology;  Laterality: N/A;  ? SPACE OAR INSTILLATION N/A 08/03/2017  ? Procedure: SPACE OAR INSTILLATION;  Surgeon: Kathie Rhodes, MD;  Location: [facility];  Service: Urology;  Laterality: N/A;  ? ?Social History  ? ?Occupational History  ? 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 Smoking status: Never  ? Smokeless tobacco: Never  ?Vaping Use  ? Vaping Use: Never used  ?Substance and Sexual Activity  ? Alcohol use: Yes  ?  Alcohol/week: 1.0 standard drink  ?  Types: 1 Cans of beer per week  ?  Comment: rare  ? Drug use: No  ? Sexual activity: Yes  ? ? ? ?Garald Balding, MD ? ? ?Note - This record has been created using [employer].  ?Chart creation errors have been sought, but may not always  ?have been located. Such creation errors do not reflect on  ?the standard of medical care. ? ?

## 2021-06-10 ENCOUNTER — Telehealth: Payer: Self-pay | Admitting: Adult Health

## 2021-06-10 NOTE — Telephone Encounter (Signed)
Left message for patient to call back and schedule Medicare Annual Wellness Visit (AWV) either virtually or in office. Left  my Jacob Stone number [phone] ? ? ?Last AWV 06/11/20 ? please schedule at anytime with [facility] Nurse Health Advisor 1 or 2 ? ? ?

## 2021-06-16 ENCOUNTER — Ambulatory Visit (INDEPENDENT_AMBULATORY_CARE_PROVIDER_SITE_OTHER): Payer: Medicare Other

## 2021-06-16 VITALS — BP 120/60 | Temp 98.3°F | Ht 69.0 in | Wt 195.3 lb

## 2021-06-16 DIAGNOSIS — Z0000 Encounter for general adult medical examination without abnormal findings: Secondary | ICD-10-CM

## 2021-06-16 NOTE — Patient Instructions (Signed)
?Jacob Stone , ?Thank you for taking time to come for your Medicare Wellness Visit. I appreciate your ongoing commitment to your health goals. Please review the following plan we discussed and let me know if I can assist you in the future.  ? ?These are the goals we discussed: ? Goals   ? ?   Increase physical activity (pt-stated)   ?   Lose about 10lb ?  ?   Weight (lb) < 175 lb (79.4 kg) (pt-stated)   ?   Increase water intake; drink water first thing in mornings and often to avoid desire for snacking. Goal to drink 6 bottles of water daily. Important to eat 3 smaller meals daily with healthy snacks in between. Make sure your portion sizes are not too big which is causing you to overeat without realizing it.  ?  ? ?  ?  ?This is a list of the screening recommended for you and due dates:  ?Health Maintenance  ?Topic Date Due  ? COVID-19 Vaccine (5 - Booster for Pfizer series) 07/28/2020  ? Zoster (Shingles) Vaccine (2 of 2) 08/18/2020  ? Eye exam for diabetics  04/20/2021  ? Flu Shot  09/06/2021  ? Complete foot exam   09/23/2021  ? Hemoglobin A1C  09/27/2021  ? Tetanus Vaccine  04/05/2024  ? Colon Cancer Screening  05/25/2024  ? Pneumonia Vaccine  Completed  ? Hepatitis C Screening: USPSTF Recommendation to screen - Ages 72-79 yo.  Completed  ? HPV Vaccine  Aged Out  ? ?Advanced directives: Yes Patient will submit copy ? ?Conditions/risks identified: None ? ?Next appointment: Follow up in one year for your annual wellness visit.  ? ?Preventive Care 36 Years and Older, Male ?Preventive care refers to lifestyle choices and visits with your health care provider that can promote health and wellness. ?What does preventive care include? ?A yearly physical exam. This is also called an annual well check. ?Dental exams once or twice a year. ?Routine eye exams. Ask your health care provider how often you should have your eyes checked. ?Personal lifestyle choices, including: ?Daily care of your teeth and gums. ?Regular  physical activity. ?Eating a healthy diet. ?Avoiding tobacco and drug use. ?Limiting alcohol use. ?Practicing safe sex. ?Taking low doses of aspirin every day. ?Taking vitamin and mineral supplements as recommended by your health care provider. ?What happens during an annual well check? ?The services and screenings done by your health care provider during your annual well check will depend on your age, overall health, lifestyle risk factors, and family history of disease. ?Counseling  ?Your health care provider may ask you questions about your: ?Alcohol use. ?Tobacco use. ?Drug use. ?Emotional well-being. ?Home and relationship well-being. ?Sexual activity. ?Eating habits. ?History of falls. ?Memory and ability to understand (cognition). ?Work and work Statistician. ?Screening  ?You may have the following tests or measurements: ?Height, weight, and BMI. ?Blood pressure. ?Lipid and cholesterol levels. These may be checked every 5 years, or more frequently if you are over 63 years old. ?Skin check. ?Lung cancer screening. You may have this screening every year starting at age 79 if you have a 30-pack-year history of smoking and currently smoke or have quit within the past 15 years. ?Fecal occult blood test (FOBT) of the stool. You may have this test every year starting at age 31. ?Flexible sigmoidoscopy or colonoscopy. You may have a sigmoidoscopy every 5 years or a colonoscopy every 10 years starting at age 66. ?Prostate cancer screening. Recommendations will vary  depending on your family history and other risks. ?Hepatitis C blood test. ?Hepatitis B blood test. ?Sexually transmitted disease (STD) testing. ?Diabetes screening. This is done by checking your blood sugar (glucose) after you have not eaten for a while (fasting). You may have this done every 1-3 years. ?Abdominal aortic aneurysm (AAA) screening. You may need this if you are a current or former smoker. ?Osteoporosis. You may be screened starting at age 20  if you are at high risk. ?Talk with your health care provider about your test results, treatment options, and if necessary, the need for more tests. ?Vaccines  ?Your health care provider may recommend certain vaccines, such as: ?Influenza vaccine. This is recommended every year. ?Tetanus, diphtheria, and acellular pertussis (Tdap, Td) vaccine. You may need a Td booster every 10 years. ?Zoster vaccine. You may need this after age 46. ?Pneumococcal 13-valent conjugate (PCV13) vaccine. One dose is recommended after age 46. ?Pneumococcal polysaccharide (PPSV23) vaccine. One dose is recommended after age 33. ?Talk to your health care provider about which screenings and vaccines you need and how often you need them. ?This information is not intended to replace advice given to you by your health care provider. Make sure you discuss any questions you have with your health care provider. ?Document Released: 02/19/2015 Document Revised: 10/13/2015 Document Reviewed: 11/24/2014 ?Elsevier Interactive Patient Education ? 2017 [employer]. ? ?Fall Prevention in the Home ?Falls can cause injuries. They can happen to people of all ages. There are many things you can do to make your home safe and to help prevent falls. ?What can I do on the outside of my home? ?Regularly fix the edges of walkways and driveways and fix any cracks. ?Remove anything that might make you trip as you walk through a door, such as a raised step or threshold. ?Trim any bushes or trees on the path to your home. ?Use bright outdoor lighting. ?Clear any walking paths of anything that might make someone trip, such as rocks or tools. ?Regularly check to see if handrails are loose or broken. Make sure that both sides of any steps have handrails. ?Any raised decks and porches should have guardrails on the edges. ?Have any leaves, snow, or ice cleared regularly. ?Use sand or salt on walking paths during winter. ?Clean up any spills in your garage right away. This  includes oil or grease spills. ?What can I do in the bathroom? ?Use night lights. ?Install grab bars by the toilet and in the tub and shower. Do not use towel bars as grab bars. ?Use non-skid mats or decals in the tub or shower. ?If you need to sit down in the shower, use a plastic, non-slip stool. ?Keep the floor dry. Clean up any water that spills on the floor as soon as it happens. ?Remove soap buildup in the tub or shower regularly. ?Attach bath mats securely with double-sided non-slip rug tape. ?Do not have throw rugs and other things on the floor that can make you trip. ?What can I do in the bedroom? ?Use night lights. ?Make sure that you have a light by your bed that is easy to reach. ?Do not use any sheets or blankets that are too big for your bed. They should not hang down onto the floor. ?Have a firm chair that has side arms. You can use this for support while you get dressed. ?Do not have throw rugs and other things on the floor that can make you trip. ?What can I do in the  kitchen? ?Clean up any spills right away. ?Avoid walking on wet floors. ?Keep items that you use a lot in easy-to-reach places. ?If you need to reach something above you, use a strong step stool that has a grab bar. ?Keep electrical cords out of the way. ?Do not use floor polish or wax that makes floors slippery. If you must use wax, use non-skid floor wax. ?Do not have throw rugs and other things on the floor that can make you trip. ?What can I do with my stairs? ?Do not leave any items on the stairs. ?Make sure that there are handrails on both sides of the stairs and use them. Fix handrails that are broken or loose. Make sure that handrails are as long as the stairways. ?Check any carpeting to make sure that it is firmly attached to the stairs. Fix any carpet that is loose or worn. ?Avoid having throw rugs at the top or bottom of the stairs. If you do have throw rugs, attach them to the floor with carpet tape. ?Make sure that you  have a light switch at the top of the stairs and the bottom of the stairs. If you do not have them, ask someone to add them for you. ?What else can I do to help prevent falls? ?Wear shoes that: ?Do not have high

## 2021-06-16 NOTE — Progress Notes (Signed)
? ?Subjective:  ? Jacob Stone. is a 71 y.o. male who presents for Medicare Annual/Subsequent preventive examination. ? ?Review of Systems    ? ? ?   ?Objective:  ?  ?Today's Vitals  ? 06/16/21 1136  ?BP: 120/60  ?Temp: 98.3 ?F (36.8 ?C)  ?TempSrc: Oral  ?Weight: 195 lb 4.8 oz (88.6 kg)  ?Height: '5\' 9"'$  (1.753 m)  ? ?Body mass index is 28.84 kg/m?. ? ? ?  06/16/2021  ? 11:51 AM 08/04/2020  ?  3:56 PM 06/11/2020  ?  8:32 AM 09/25/2019  ?  2:48 PM 03/18/2019  ? 11:28 AM 12/10/2017  ? 11:38 AM 08/03/2017  ?  8:12 AM  ?Advanced Directives  ?Does Patient Have a Medical Advance Directive? Yes Yes No No No Yes No  ?Type of Paramedic of [place];Living will        ?Does patient want to make changes to medical advance directive? No - Patient declined No - Patient declined       ?Copy of [place] in Chart? No - copy requested        ?Would patient like information on creating a medical advance directive?   No - Patient declined No - Patient declined No - Patient declined  Yes (MAU/Ambulatory/Procedural Areas - Information given)  ? ? ?Current Medications (verified) ?Outpatient Encounter Medications as of 06/16/2021  ?Medication Sig  ? allopurinol (ZYLOPRIM) 300 MG tablet TAKE 1 TABLET(300 MG) BY MOUTH DAILY  ? amLODipine (NORVASC) 10 MG tablet TAKE 1 TABLET(10 MG) BY MOUTH DAILY  ? aspirin 81 MG chewable tablet Chew by mouth.  ? cetirizine (ZYRTEC) 10 MG tablet Take 10 mg by mouth daily.  ? Cholecalciferol (VITAMIN D) 125 MCG (5000 UT) CAPS Take by mouth daily.  ? Docusate Calcium ([employer] STOOL SOFTENER PO) Take by mouth daily.  ? hydrochlorothiazide (HYDRODIURIL) 12.5 MG tablet TAKE 1 TABLET BY MOUTH DAILY  ? losartan (COZAAR) 25 MG tablet TAKE 1 TABLET BY MOUTH DAILY  ? metFORMIN (GLUCOPHAGE) 500 MG tablet TAKE 1 TABLET BY MOUTH BEFORE BREAKFAST  ? Misc Natural Products (FIBER 7 PO) Take 2 tablets by mouth daily.   ? Multiple Vitamins-Minerals (DAILY MULTI 50+ PO) Take 1 tablet by mouth  daily.  ? simvastatin (ZOCOR) 20 MG tablet TAKE 1 TABLET(20 MG) BY MOUTH DAILY  ? tamsulosin (FLOMAX) 0.4 MG CAPS capsule Take 0.4 mg by mouth daily.  ? ?No facility-administered encounter medications on file as of 06/16/2021.  ? ? ?Allergies (verified) ?Oxycodone  ? ?History: ?Past Medical History:  ?Diagnosis Date  ? Carotid artery calcification 03/2011  ? MILD HARD PLAQUE LEFT BULB, 0-39% BIL. ICA STENOSIS, PATENT VERTEBRAL ARTERIES WITH ANTEGRADE FLOW  ? Diabetes type 2, controlled ([place])   ? Diverticulitis   ? Fatty liver   ? FH: kidney cancer 2005  ? right kidney removed   ? GERD (gastroesophageal reflux disease)   ? Gout   ? History of kidney stones   ? Left kidney  ? HTN (hypertension)   ? Hyperlipidemia   ? OA (osteoarthritis)   ? Knee  ? OSA (obstructive sleep apnea) 04/18/2016  ? SEVERE; UNABLE TO WEAR CPAP MORE THAN 2-3 HOURS PER NIGHT  ? Plantar fasciitis   ? Prostate cancer ([place])   ? Seasonal allergies   ? ?Past Surgical History:  ?Procedure Laterality Date  ? COLON SURGERY  2011  ? Partial colon removed due to diverticulitis  ? COLONOSCOPY  08/2009  ?  NEPHRECTOMY Right   ? PROSTATE BIOPSY    ? RADIOACTIVE SEED IMPLANT N/A 08/03/2017  ? Procedure: RADIOACTIVE SEED IMPLANT/BRACHYTHERAPY IMPLANT;  Surgeon: Kathie Rhodes, MD;  [facility];  Service: Urology;  Laterality: N/A;  ? SPACE OAR INSTILLATION N/A 08/03/2017  ? Procedure: SPACE OAR INSTILLATION;  Surgeon: Kathie Rhodes, MD;  Location: [facility];  Service: Urology;  Laterality: N/A;  ? ?Family History  ?Problem Relation Age of Onset  ? Heart disease Mother   ?     heart failure - 65 and 68 when she passed   ? Stroke Mother   ? Cancer Father   ?     wagners  ? Glaucoma Father   ?     wagoner's glaucoma  ? Hypertension Other   ?     multiple family members  ? ?Social History  ? ?Socioeconomic History  ? Marital status: Married  ?  Spouse name: Not on file  ? Number of children: Not on file  ? Years of education:  Not on file  ? Highest education level: Not on file  ?Occupational History  ? 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 Smoking status: Never  ? Smokeless tobacco: Never  ?Vaping Use  ? Vaping Use: Never used  ?Substance and Sexual Activity  ? Alcohol use: Yes  ?  Alcohol/week: 1.0 standard drink  ?  Types: 1 Cans of beer per week  ?  Comment: rare  ? Drug use: No  ? Sexual activity: Yes  ?Other Topics Concern  ? Not on file  ?Social History Narrative  ? HH 2  ? Married wife with compromised immune system  ? 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 ?Social Determinants of Health  ? ?Financial Resource Strain: Low Risk   ? Difficulty of Paying Living Expenses: Not hard at all  ?Food Insecurity: No Food Insecurity  ? Worried About Charity fundraiser in the Last Year: Never true  ? Ran Out of Food in the Last Year: Never true  ?Transportation Needs: No Transportation Needs  ? Lack of Transportation (Medical): No  ? Lack of Transportation (Non-Medical): No  ?Physical Activity: Sufficiently Active  ? Days of Exercise per Week: 4 days  ? Minutes of Exercise per Session: 50 min  ?Stress: No Stress Concern Present  ? Feeling of Stress : Not at all  ?Social Connections: Socially Integrated  ? Frequency of Communication with Friends and Family: More than three times a week  ? Frequency of Social Gatherings with Friends and Family: More than three times a week  ? Attends Religious Services: 1 to 4 times per year  ? Active Member of Clubs or Organizations: Yes  ? Attends Archivist Meetings: More than 4 times per year  ? Marital Status: Married  ? ? ? ?Clinical Intake: ?Nutrition Risk Assessment: ? ?Has the patient had any N/V/D within the last 2 months?  No  ?Does the patient have any non-healing wounds?  No  ?Has the patient had any unintentional weight loss or weight gain?  No   ? ?Diabetes: ? ?Is the patient diabetic?  Yes  ?If diabetic, was a CBG obtained today?  No  ?Did the patient bring in their glucometer from home?  No  ?How often do you monitor  your CBG's? PRN.  ? ?Financial Strains and Diabetes Management: ? ?Are you having any financial strains with the device, your supplies or your medication? No .  ?Does the patient want to be seen by Chronic Care Management for management of their diabetes?  No  ?Would the patient like to be referred to a Nutritionist or for Diabetic Management?  No  ? ?Diabetic Exams: ? ?Diabetic Eye Exam: Completed Yes. Overdue for diabetic eye exam. Pt has been advised about the importance in completing this exam. A referral has been placed today. Message sent to referral coordinator for scheduling purposes. Advised pt to expect a call from office referred to regarding appt. ? ?Diabetic Foot Exam: Completed Yes. Pt has been advised about the importance in completing this exam. Pt is scheduled for diabetic foot exam on Followed by PCP.   ?Pre-visit preparation completed: No ?Diabetic? Yes ? ?Activities of Daily Living ? ?  06/16/2021  ? 11:47 AM 09/23/2020  ?  7:10 AM  ?In your present state of health, do you have any difficulty performing the following activities:  ?Hearing? 0 0  ?Vision? 0 0  ?Difficulty concentrating or making decisions? 0 0  ?Walking or climbing stairs? 0 0  ?Dressing or bathing? 0 0  ?Doing errands, shopping? 0 0  ?Preparing Food and eating ? N   ?Using the Toilet? N   ?In the past six months, have you accidently leaked urine? N   ?Do you have problems with loss of bowel control? N   ?Managing your Medications? N   ?Managing your Finances? N   ?Housekeeping or managing your Housekeeping? N   ? ? ?Patient Care Team: ?Dorothyann Peng, NP as PCP - General (Family Medicine) ?Magnus Sinning, MD as Consulting Physician (Physical Medicine and Rehabilitation) ?Garald Balding, MD as Consulting Physician (Orthopedic Surgery) ? ?Indicate any  recent Medical Services you may have received from other than [facility] providers in the past year (date may be approximate). ? ?   ?Assessment:  ? This is a routine wellness examination for [place]. ? ?Hearing/Vision

## 2021-08-20 ENCOUNTER — Other Ambulatory Visit: Payer: Self-pay | Admitting: Adult Health

## 2021-08-23 NOTE — Telephone Encounter (Signed)
Patient need to schedule an ov for more refills. 

## 2021-08-24 ENCOUNTER — Ambulatory Visit (INDEPENDENT_AMBULATORY_CARE_PROVIDER_SITE_OTHER): Payer: Medicare Other | Admitting: Adult Health

## 2021-08-24 ENCOUNTER — Encounter: Payer: Self-pay | Admitting: Adult Health

## 2021-08-24 VITALS — BP 118/68 | HR 75 | Temp 98.0°F | Ht 69.0 in | Wt 196.0 lb

## 2021-08-24 DIAGNOSIS — E139 Other specified diabetes mellitus without complications: Secondary | ICD-10-CM

## 2021-08-24 DIAGNOSIS — I10 Essential (primary) hypertension: Secondary | ICD-10-CM | POA: Diagnosis not present

## 2021-08-24 LAB — POCT GLYCOSYLATED HEMOGLOBIN (HGB A1C): Hemoglobin A1C: 6.9 % — AB (ref 4.0–5.6)

## 2021-08-24 MED ORDER — METFORMIN HCL 500 MG PO TABS: ORAL_TABLET | ORAL | 0 refills | Status: DC
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
## 2021-08-25 ENCOUNTER — Other Ambulatory Visit: Payer: Self-pay | Admitting: Adult Health

## 2021-08-25 DIAGNOSIS — E139 Other specified diabetes mellitus without complications: Secondary | ICD-10-CM

## 2021-09-21 ENCOUNTER — Other Ambulatory Visit: Payer: Self-pay | Admitting: Adult Health

## 2021-09-29 ENCOUNTER — Encounter: Payer: Self-pay | Admitting: Adult Health

## 2021-09-29 ENCOUNTER — Ambulatory Visit (INDEPENDENT_AMBULATORY_CARE_PROVIDER_SITE_OTHER): Payer: Medicare Other | Admitting: Adult Health

## 2021-09-29 VITALS — BP 110/60 | HR 77 | Temp 97.8°F | Ht 69.0 in | Wt 187.0 lb

## 2021-09-29 DIAGNOSIS — Z0000 Encounter for general adult medical examination without abnormal findings: Secondary | ICD-10-CM

## 2021-09-29 DIAGNOSIS — G4733 Obstructive sleep apnea (adult) (pediatric): Secondary | ICD-10-CM

## 2021-09-29 DIAGNOSIS — Z8546 Personal history of malignant neoplasm of prostate: Secondary | ICD-10-CM

## 2021-09-29 DIAGNOSIS — M109 Gout, unspecified: Secondary | ICD-10-CM

## 2021-09-29 DIAGNOSIS — E1169 Type 2 diabetes mellitus with other specified complication: Secondary | ICD-10-CM

## 2021-09-29 DIAGNOSIS — I10 Essential (primary) hypertension: Secondary | ICD-10-CM | POA: Diagnosis not present

## 2021-09-29 DIAGNOSIS — E559 Vitamin D deficiency, unspecified: Secondary | ICD-10-CM

## 2021-09-29 DIAGNOSIS — E785 Hyperlipidemia, unspecified: Secondary | ICD-10-CM

## 2021-09-29 LAB — LIPID PANEL
Cholesterol: 113 mg/dL (ref 0–200)
HDL: 41.8 mg/dL (ref >39.00)
NonHDL: 70.82
Total CHOL/HDL Ratio: 3
Triglycerides: 265 mg/dL — ABNORMAL HIGH (ref 0.0–149.0)
VLDL: 53 mg/dL — ABNORMAL HIGH (ref 0.0–40.0)

## 2021-09-29 LAB — CBC WITH DIFFERENTIAL/PLATELET
Basophils Absolute: 0 10*3/uL (ref 0.0–0.1)
Basophils Relative: 0.5 % (ref 0.0–3.0)
Eosinophils Absolute: 0.5 10*3/uL (ref 0.0–0.7)
Eosinophils Relative: 7.7 % — ABNORMAL HIGH (ref 0.0–5.0)
HCT: 47.5 % (ref 39.0–52.0)
Hemoglobin: 15.8 g/dL (ref 13.0–17.0)
Lymphocytes Relative: 23.3 % (ref 12.0–46.0)
Lymphs Abs: 1.5 10*3/uL (ref 0.7–4.0)
MCHC: 33.3 g/dL (ref 30.0–36.0)
MCV: 94.5 fl (ref 78.0–100.0)
Monocytes Absolute: 0.5 10*3/uL (ref 0.1–1.0)
Monocytes Relative: 8.4 % (ref 3.0–12.0)
Neutro Abs: 3.8 10*3/uL (ref 1.4–7.7)
Neutrophils Relative %: 60.1 % (ref 43.0–77.0)
Platelets: 272 10*3/uL (ref 150.0–400.0)
RBC: 5.02 Mil/uL (ref 4.22–5.81)
RDW: 13.7 % (ref 11.5–15.5)
WBC: 6.3 10*3/uL (ref 4.0–10.5)

## 2021-09-29 LAB — COMPREHENSIVE METABOLIC PANEL
ALT: 39 U/L (ref 0–53)
AST: 28 U/L (ref 0–37)
Albumin: 4.5 g/dL (ref 3.5–5.2)
Alkaline Phosphatase: 58 U/L (ref 39–117)
BUN: 17 mg/dL (ref 6–23)
CO2: 30 mEq/L (ref 19–32)
Calcium: 9.7 mg/dL (ref 8.4–10.5)
Chloride: 100 mEq/L (ref 96–112)
Creatinine, Ser: 1.24 mg/dL (ref 0.40–1.50)
GFR: 57.63 mL/min — ABNORMAL LOW (ref >60.00)
Glucose, Bld: 165 mg/dL — ABNORMAL HIGH (ref 70–99)
Potassium: 3.7 mEq/L (ref 3.5–5.1)
Sodium: 138 mEq/L (ref 135–145)
Total Bilirubin: 0.7 mg/dL (ref 0.2–1.2)
Total Protein: 7.5 g/dL (ref 6.0–8.3)

## 2021-09-29 LAB — TSH: TSH: 1.85 u[IU]/mL (ref 0.35–5.50)

## 2021-09-29 LAB — VITAMIN D 25 HYDROXY (VIT D DEFICIENCY, FRACTURES): VITD: 67.54 ng/mL (ref 30.00–100.00)

## 2021-09-29 LAB — HEMOGLOBIN A1C: Hgb A1c MFr Bld: 7.2 % — ABNORMAL HIGH (ref 4.6–6.5)

## 2021-09-29 LAB — LDL CHOLESTEROL, DIRECT: Direct LDL: 50 mg/dL

## 2021-09-29 NOTE — Progress Notes (Signed)
Subjective:    Patient ID: Jacob Stone., male    DOB: 21-Jun-1950, 71 y.o.   MRN: [id number]  HPI Patient presents for yearly preventative medicine examination. He is a pleasant 71 year old male who  has a past medical history of Carotid artery calcification (03/2011), Diabetes type 2, controlled ([place]), Diverticulitis, Fatty liver, FH: kidney cancer (2005), GERD (gastroesophageal reflux disease), Gout, History of kidney stones, HTN (hypertension), Hyperlipidemia, OA (osteoarthritis), OSA (obstructive sleep apnea) (04/18/2016), Plantar fasciitis, Prostate cancer ([place]), and Seasonal allergies.  Diabetes mellitus type II-he is managed with metformin 500 mg daily.  He does not monitor his blood sugars at home.  Denies episodes of hypoglycemia.  His A1c has been controlled for a number of years.    Lab Results  Component Value Date   HGBA1C 6.9 (A) 08/24/2021   Hypertension -managed with Norvasc 10 mg daily, HCTZ 25 mg daily, and Cozaar 25 mg daily.  He denies dizziness, lightheadedness, shortness of breath, or chest pain BP Readings from Last 3 Encounters:  09/29/21 110/60  08/24/21 118/68  06/16/21 120/60   Hyperlipidemia-managed with simvastatin 20 mg, he denies myalgia Lab Results  Component Value Date   CHOL 107 09/23/2020   HDL 44.50 09/23/2020   LDLCALC 29 09/23/2020   LDLDIRECT 103.0 07/01/2014   TRIG 167.0 (H) 09/23/2020   CHOLHDL 2 09/23/2020   History of gout-currently controlled on allopurinol 300 mg daily.  He denies any recent gout flares  History of prostate cancer-had seed implant in June 2019.  He is followed by [facility] urology every 6 months.He is taking Flomax 0.4 mg to help with urgency.   Vitamin D Deficiency - takes 1000 units daily   OSA - was last seen by pulmonary in 2019 - he cannot wear his CPAP due to being on a full face mask. He reports chronic fatigue and poor sleep . Is interested in being referred back to pulmonary to discuss the [facility] device.     All immunizations and health maintenance protocols were reviewed with the patient and needed orders were placed.  Appropriate screening laboratory values were ordered for the patient including screening of hyperlipidemia, renal function and hepatic function. If indicated by BPH, a PSA was ordered.  Medication reconciliation,  past medical history, social history, problem list and allergies were reviewed in detail with the patient  Goals were established with regard to weight loss, exercise, and  diet in compliance with medications Wt Readings from Last 3 Encounters:  09/29/21 187 lb (84.8 kg)  08/24/21 196 lb (88.9 kg)  06/16/21 195 lb 4.8 oz (88.6 kg)   He is up to date on routine colon cancer screening   Review of Systems  Constitutional:  Positive for fatigue.  HENT: Negative.    Eyes: Negative.   Respiratory: Negative.    Cardiovascular: Negative.   Gastrointestinal: Negative.   Endocrine: Negative.   Genitourinary: Negative.   Musculoskeletal: Negative.   Skin: Negative.   Allergic/Immunologic: Negative.   Neurological: Negative.   Hematological: Negative.   Psychiatric/Behavioral: Negative.    All other systems reviewed and are negative.  Past Medical History:  Diagnosis Date   Carotid artery calcification 03/2011   MILD HARD PLAQUE LEFT BULB, 0-39% BIL. ICA STENOSIS, PATENT VERTEBRAL ARTERIES WITH ANTEGRADE FLOW   Diabetes type 2, controlled ([place])    Diverticulitis    Fatty liver    FH: kidney cancer 2005   right kidney removed    GERD (gastroesophageal reflux disease)  Gout    History of kidney stones    Left kidney   HTN (hypertension)    Hyperlipidemia    OA (osteoarthritis)    Knee   OSA (obstructive sleep apnea) 04/18/2016   SEVERE; UNABLE TO WEAR CPAP MORE THAN 2-3 HOURS PER NIGHT   Plantar fasciitis    Prostate cancer ([place])    Seasonal allergies     Social History   Socioeconomic History   Marital status: Married    Spouse name: Not on  file   Number of children: Not on file   Years of education: Not on file   Highest education level: Not on file  Occupational History   Occupation: retired    Comment: Physiological scientist   Occupation: part time    Comment: golf course  Tobacco Use   Smoking status: Never   Smokeless tobacco: Never  Vaping Use   Vaping Use: Never used  Substance and Sexual Activity   Alcohol use: Yes    Alcohol/week: 1.0 standard drink of alcohol    Types: 1 Cans of beer per week    Comment: rare   Drug use: No   Sexual activity: Yes  Other Topics Concern   Not on file  Social History Narrative   HH 2   Married wife with compromised immune system   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 - > 5 miles per day   Social Determinants of Health   Financial Resource Strain: Low Risk  (06/16/2021)   Overall Financial Resource Strain (CARDIA)    Difficulty of Paying Living Expenses: Not hard at all  Food Insecurity: No Food Insecurity (06/16/2021)   Hunger Vital Sign    Worried About Running Out of Food in the Last Year: Never true    Ran Out of Food in the Last Year: Never true  Transportation Needs: No Transportation Needs (06/16/2021)   PRAPARE - Hydrologist (Medical): No    Lack of Transportation (Non-Medical): No  Physical Activity: Sufficiently Active (06/16/2021)   Exercise Vital Sign    Days of Exercise per Week: 4 days    Minutes of Exercise per Session: 50 min  Stress: No Stress Concern Present (06/16/2021)   [place]    Feeling of Stress : Not at all  Social Connections: [place] (06/16/2021)   Social Connection and Isolation Panel [NHANES]    Frequency of Communication with Friends and Family: More than three times a week    Frequency of Social Gatherings with Friends and Family: More than three times a week    Attends Religious Services: 1 to 4  times per year    Active Member of [employer] or Organizations: Yes    Attends Archivist Meetings: More than 4 times per year    Marital Status: Married  Human resources officer Violence: Not At Risk (06/16/2021)   Humiliation, Afraid, Rape, and Kick questionnaire    Fear of Current or Ex-Partner: No    Emotionally Abused: No    Physically Abused: No    Sexually Abused: No    Past Surgical History:  Procedure Laterality Date   COLON SURGERY  2011   Partial colon removed due to diverticulitis   COLONOSCOPY  08/2009   NEPHRECTOMY Right    PROSTATE BIOPSY     RADIOACTIVE SEED IMPLANT N/A 08/03/2017   Procedure: RADIOACTIVE SEED IMPLANT/BRACHYTHERAPY IMPLANT;  Surgeon: Kathie Rhodes,  MD;  Location: [place];  Service: Urology;  Laterality: N/A;   SPACE OAR INSTILLATION N/A 08/03/2017   Procedure: SPACE OAR INSTILLATION;  Surgeon: Kathie Rhodes, MD;  Location: [facility];  Service: Urology;  Laterality: N/A;    Family History  Problem Relation Age of Onset   Heart disease Mother        heart failure - 30 and 8 when she passed    Stroke Mother    Cancer Father        wagners   Glaucoma Father        wagoner's glaucoma   Hypertension Other        multiple family members    Allergies  Allergen Reactions   Oxycodone Other (See Comments)    Patient stated he has nightmares with oxycodone    Current Outpatient Medications on File Prior to Visit  Medication Sig Dispense Refill   allopurinol (ZYLOPRIM) 300 MG tablet TAKE 1 TABLET(300 MG) BY MOUTH DAILY 90 tablet 3   amLODipine (NORVASC) 10 MG tablet TAKE 1 TABLET(10 MG) BY MOUTH DAILY 90 tablet 3   aspirin 81 MG chewable tablet Chew by mouth.     cetirizine (ZYRTEC) 10 MG tablet Take 10 mg by mouth daily.     Cholecalciferol (VITAMIN D) 125 MCG (5000 UT) CAPS Take by mouth daily.     Docusate Calcium ([employer] STOOL SOFTENER PO) Take by mouth daily.     hydrochlorothiazide (HYDRODIURIL) 12.5 MG tablet  TAKE 1 TABLET BY MOUTH DAILY 90 tablet 3   losartan (COZAAR) 25 MG tablet TAKE 1 TABLET BY MOUTH DAILY 90 tablet 3   metFORMIN (GLUCOPHAGE) 500 MG tablet TAKE 1 TABLET BY MOUTH BEFORE BREAKFAST 90 tablet 0   Misc Natural Products (FIBER 7 PO) Take 2 tablets by mouth daily.      Multiple Vitamins-Minerals (DAILY MULTI 50+ PO) Take 1 tablet by mouth daily.     simvastatin (ZOCOR) 20 MG tablet TAKE 1 TABLET(20 MG) BY MOUTH DAILY 90 tablet 3   tamsulosin (FLOMAX) 0.4 MG CAPS capsule Take 0.4 mg by mouth daily.     No current facility-administered medications on file prior to visit.    BP 110/60   Pulse 77   Temp 97.8 F (36.6 C) (Oral)   Ht '5\' 9"'$  (1.753 m)   Wt 187 lb (84.8 kg)   SpO2 98%   BMI 27.62 kg/m       Objective:   Physical Exam Vitals and nursing note reviewed.  Constitutional:      General: He is not in acute distress.    Appearance: Normal appearance. He is well-developed and normal weight.  HENT:     Head: Normocephalic and atraumatic.     Right Ear: Tympanic membrane, ear canal and external ear normal. There is no impacted cerumen.     Left Ear: Tympanic membrane, ear canal and external ear normal. There is no impacted cerumen.     Nose: Nose normal. No congestion or rhinorrhea.     Mouth/Throat:     Mouth: Mucous membranes are moist.     Pharynx: Oropharynx is clear. No oropharyngeal exudate or posterior oropharyngeal erythema.  Eyes:     General:        Right eye: No discharge.        Left eye: No discharge.     Extraocular Movements: Extraocular movements intact.     Conjunctiva/sclera: Conjunctivae normal.     Pupils: Pupils are equal,  round, and reactive to light.  Neck:     Vascular: No carotid bruit.     Trachea: No tracheal deviation.  Cardiovascular:     Rate and Rhythm: Normal rate and regular rhythm.     Pulses: Normal pulses.     Heart sounds: Normal heart sounds. No murmur heard.    No friction rub. No gallop.  Pulmonary:     Effort:  Pulmonary effort is normal. No respiratory distress.     Breath sounds: Normal breath sounds. No stridor. No wheezing, rhonchi or rales.  Chest:     Chest wall: No tenderness.  Abdominal:     General: Bowel sounds are normal. There is no distension.     Palpations: Abdomen is soft. There is no mass.     Tenderness: There is no abdominal tenderness. There is no right CVA tenderness, left CVA tenderness, guarding or rebound.     Hernia: No hernia is present.  Musculoskeletal:        General: No swelling, tenderness, deformity or signs of injury. Normal range of motion.     Right lower leg: No edema.     Left lower leg: No edema.  Lymphadenopathy:     Cervical: No cervical adenopathy.  Skin:    General: Skin is warm and dry.     Capillary Refill: Capillary refill takes less than 2 seconds.     Coloration: Skin is not jaundiced or pale.     Findings: No bruising, erythema, lesion or rash.  Neurological:     General: No focal deficit present.     Mental Status: He is alert and oriented to person, place, and time.     Cranial Nerves: No cranial nerve deficit.     Sensory: No sensory deficit.     Motor: No weakness.     Coordination: Coordination normal.     Gait: Gait normal.     Deep Tendon Reflexes: Reflexes normal.  Psychiatric:        Mood and Affect: Mood normal.        Behavior: Behavior normal.        Thought Content: Thought content normal.        Judgment: Judgment normal.       Assessment & Plan:   1. Routine general medical examination at a health care facility - Continue with work on weight loss through diet and exercise - Follow up in one year or sooner if needed - CBC with Differential/Platelet; Future - Comprehensive metabolic panel; Future - Hemoglobin A1c; Future - Lipid panel; Future - TSH; Future  2. Essential hypertension - Well controled. No change in medication  - CBC with Differential/Platelet; Future - Comprehensive metabolic panel; Future -  Hemoglobin A1c; Future - Lipid panel; Future - TSH; Future  3. Type 2 diabetes mellitus with other specified complication, without long-term current use of insulin (HCC) - Consider dose change of metformin  - Likely follow up in 6 months  - continue to work on weight loss  - CBC with Differential/Platelet; Future - Comprehensive metabolic panel; Future - Hemoglobin A1c; Future - Lipid panel; Future - TSH; Future  4. Hyperlipidemia, unspecified hyperlipidemia type - Consider increase in statin  - CBC with Differential/Platelet; Future - Comprehensive metabolic panel; Future - Hemoglobin A1c; Future - Lipid panel; Future - TSH; Future  5. History of prostate cancer - Follow up with urology as directed - CBC with Differential/Platelet; Future - Comprehensive metabolic panel; Future - Hemoglobin A1c; Future - Lipid  panel; Future - TSH; Future  6. Acute gout, unspecified cause, unspecified site - Continue Allopurinol  - CBC with Differential/Platelet; Future - Comprehensive metabolic panel; Future - Hemoglobin A1c; Future - Lipid panel; Future - TSH; Future  7. Vitamin D deficiency  - VITAMIN D 25 Hydroxy (Vit-D Deficiency, Fractures); Future  8. OSA (obstructive sleep apnea)  - Ambulatory referral to Pulmonology  Dorothyann Peng, NP

## 2021-09-29 NOTE — Patient Instructions (Signed)
It was great seeing you today   We will follow up with you regarding your lab work   Please let me know if you need anything   

## 2021-11-01 ENCOUNTER — Encounter: Payer: Self-pay | Admitting: Adult Health

## 2021-11-01 ENCOUNTER — Ambulatory Visit (INDEPENDENT_AMBULATORY_CARE_PROVIDER_SITE_OTHER): Payer: Medicare Other | Admitting: Adult Health

## 2021-11-01 VITALS — BP 110/66 | HR 73 | Temp 97.4°F | Wt 185.6 lb

## 2021-11-01 DIAGNOSIS — M26609 Unspecified temporomandibular joint disorder, unspecified side: Secondary | ICD-10-CM

## 2021-11-01 MED ORDER — METHYLPREDNISOLONE 4 MG PO TBPK: ORAL_TABLET | ORAL | 0 refills | Status: DC

## 2021-11-01 MED ORDER — CYCLOBENZAPRINE HCL 10 MG PO TABS: 10.0000 mg | ORAL_TABLET | Freq: Every day | ORAL | 0 refills | Status: DC

## 2021-11-01 NOTE — Progress Notes (Signed)
Subjective:    Patient ID: Jacob Sermons., male    DOB: 1950/07/28, 71 y.o.   MRN: [id number]  HPI 71 year old male who  has a past medical history of Carotid artery calcification (03/2011), Diabetes type 2, controlled ([place]), Diverticulitis, Fatty liver, FH: kidney cancer (2005), GERD (gastroesophageal reflux disease), Gout, History of kidney stones, HTN (hypertension), Hyperlipidemia, OA (osteoarthritis), OSA (obstructive sleep apnea) (04/18/2016), Plantar fasciitis, Prostate cancer ([place]), and Seasonal allergies.  He presents to the office today for an acute issue. He reports that two nights ago he was eating dinner and his jaw locked up on the left side. He heard a popping sensation when he was chewing, had pain, swelling to the left cheek and difficulty chewing. At home he used ice and Tylenol and the pain and swelling subsided. He continues to have a popping sensation with certain movements of his jaw.    Review of Systems See HPI   Past Medical History:  Diagnosis Date   Carotid artery calcification 03/2011   MILD HARD PLAQUE LEFT BULB, 0-39% BIL. ICA STENOSIS, PATENT VERTEBRAL ARTERIES WITH ANTEGRADE FLOW   Diabetes type 2, controlled ([place])    Diverticulitis    Fatty liver    FH: kidney cancer 2005   right kidney removed    GERD (gastroesophageal reflux disease)    Gout    History of kidney stones    Left kidney   HTN (hypertension)    Hyperlipidemia    OA (osteoarthritis)    Knee   OSA (obstructive sleep apnea) 04/18/2016   SEVERE; UNABLE TO WEAR CPAP MORE THAN 2-3 HOURS PER NIGHT   Plantar fasciitis    Prostate cancer ([place])    Seasonal allergies     Social History   Socioeconomic History   Marital status: Married    Spouse name: Not on file   Number of children: Not on file   Years of education: Not on file   Highest education level: Not on file  Occupational History   Occupation: retired    Comment: Physiological scientist   Occupation: part time    Comment:  golf course  Tobacco Use   Smoking status: Never   Smokeless tobacco: Never  Vaping Use   Vaping Use: Never used  Substance and Sexual Activity   Alcohol use: Yes    Alcohol/week: 1.0 standard drink of alcohol    Types: 1 Cans of beer per week    Comment: rare   Drug use: No   Sexual activity: Yes  Other Topics Concern   Not on file  Social History Narrative   HH 2   Married wife with compromised immune system   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 - > 5 miles per day   Social Determinants of Health   Financial Resource Strain: Low Risk  (06/16/2021)   Overall Financial Resource Strain (CARDIA)    Difficulty of Paying Living Expenses: Not hard at all  Food Insecurity: No Food Insecurity (06/16/2021)   Hunger Vital Sign    Worried About Running Out of Food in the Last Year: Never true    Ran Out of Food in the Last Year: Never true  Transportation Needs: No Transportation Needs (06/16/2021)   PRAPARE - Hydrologist (Medical): No    Lack of Transportation (Non-Medical): No  Physical Activity: Sufficiently Active (06/16/2021)   Exercise Vital Sign    Days of Exercise per  Week: 4 days    Minutes of Exercise per Session: 50 min  Stress: No Stress Concern Present (06/16/2021)   [place]    Feeling of Stress : Not at all  Social Connections: [place] (06/16/2021)   Social Connection and Isolation Panel [NHANES]    Frequency of Communication with Friends and Family: More than three times a week    Frequency of Social Gatherings with Friends and Family: More than three times a week    Attends Religious Services: 1 to 4 times per year    Active Member of [employer] or Organizations: Yes    Attends Archivist Meetings: More than 4 times per year    Marital Status: Married  Human resources officer Violence: Not At Risk (06/16/2021)   Humiliation,  Afraid, Rape, and Kick questionnaire    Fear of Current or Ex-Partner: No    Emotionally Abused: No    Physically Abused: No    Sexually Abused: No    Past Surgical History:  Procedure Laterality Date   COLON SURGERY  2011   Partial colon removed due to diverticulitis   COLONOSCOPY  08/2009   NEPHRECTOMY Right    PROSTATE BIOPSY     RADIOACTIVE SEED IMPLANT N/A 08/03/2017   Procedure: RADIOACTIVE SEED IMPLANT/BRACHYTHERAPY IMPLANT;  Surgeon: Kathie Rhodes, MD;  Location: [place];  Service: Urology;  Laterality: N/A;   SPACE OAR INSTILLATION N/A 08/03/2017   Procedure: SPACE OAR INSTILLATION;  Surgeon: Kathie Rhodes, MD;  Location: [facility];  Service: Urology;  Laterality: N/A;    Family History  Problem Relation Age of Onset   Heart disease Mother        heart failure - 78 and 76 when she passed    Stroke Mother    Cancer Father        wagners   Glaucoma Father        wagoner's glaucoma   Hypertension Other        multiple family members    Allergies  Allergen Reactions   Oxycodone Other (See Comments)    Patient stated he has nightmares with oxycodone    Current Outpatient Medications on File Prior to Visit  Medication Sig Dispense Refill   allopurinol (ZYLOPRIM) 300 MG tablet TAKE 1 TABLET(300 MG) BY MOUTH DAILY 90 tablet 3   amLODipine (NORVASC) 10 MG tablet TAKE 1 TABLET(10 MG) BY MOUTH DAILY 90 tablet 3   aspirin 81 MG chewable tablet Chew by mouth.     cetirizine (ZYRTEC) 10 MG tablet Take 10 mg by mouth daily.     Cholecalciferol (VITAMIN D) 125 MCG (5000 UT) CAPS Take by mouth daily.     Docusate Calcium ([employer]S STOOL SOFTENER PO) Take by mouth daily.     hydrochlorothiazide (HYDRODIURIL) 12.5 MG tablet TAKE 1 TABLET BY MOUTH DAILY 90 tablet 3   losartan (COZAAR) 25 MG tablet TAKE 1 TABLET BY MOUTH DAILY 90 tablet 3   metFORMIN (GLUCOPHAGE) 500 MG tablet TAKE 1 TABLET BY MOUTH BEFORE BREAKFAST 90 tablet 0   Misc Natural  Products (FIBER 7 PO) Take 2 tablets by mouth daily.      Multiple Vitamins-Minerals (DAILY MULTI 50+ PO) Take 1 tablet by mouth daily.     simvastatin (ZOCOR) 20 MG tablet TAKE 1 TABLET(20 MG) BY MOUTH DAILY 90 tablet 3   tamsulosin (FLOMAX) 0.4 MG CAPS capsule Take 0.4 mg by mouth daily.  No current facility-administered medications on file prior to visit.    BP 110/66 (BP Location: Left Arm, Patient Position: Sitting, Cuff Size: Large)   Pulse 73   Temp (!) 97.4 F (36.3 C) (Oral)   Wt 185 lb 9.6 oz (84.2 kg)   SpO2 97%   BMI 27.41 kg/m       Objective:   Physical Exam Vitals and nursing note reviewed.  Constitutional:      Appearance: Normal appearance.  HENT:     Right Ear: Hearing, tympanic membrane, ear canal and external ear normal.     Left Ear: Hearing, tympanic membrane, ear canal and external ear normal.     Mouth/Throat:     Pharynx: Oropharynx is clear. Uvula midline. No pharyngeal swelling or posterior oropharyngeal erythema.  Cardiovascular:     Rate and Rhythm: Normal rate and regular rhythm.     Pulses: Normal pulses.     Heart sounds: Normal heart sounds.  Musculoskeletal:        General: Tenderness present. Normal range of motion.     Comments: He does have some mild tenderness with palpation and a popping sensation at the left TMJ with movement.   Skin:    General: Skin is warm and dry.     Capillary Refill: Capillary refill takes less than 2 seconds.  Neurological:     Mental Status: He is alert.       Assessment & Plan:  1. TMJ (temporomandibular joint disorder) - Symptoms consistent with TMJ. Will send in medrol dose pack and Flexeril. Can continue to use ice and can take 2-3 days of Motrin to help with inflammation  - Follow up if not resolved in a week  - methylPREDNISolone (MEDROL DOSEPAK) 4 MG TBPK tablet; Take as directed  Dispense: 21 tablet; Refill: 0 - cyclobenzaprine (FLEXERIL) 10 MG tablet; Take 1 tablet (10 mg total) by mouth at  bedtime.  Dispense: 15 tablet; Refill: 0   Dorothyann Peng, NP  Time spent with patient today was 31 minutes which consisted of chart review, discussing  TMJ, work up, treatment answering questions and documentation.

## 2021-11-20 ENCOUNTER — Other Ambulatory Visit: Payer: Self-pay | Admitting: Adult Health

## 2021-11-20 DIAGNOSIS — E139 Other specified diabetes mellitus without complications: Secondary | ICD-10-CM

## 2021-12-24 DIAGNOSIS — R051 Acute cough: Secondary | ICD-10-CM | POA: Diagnosis not present

## 2022-01-03 DIAGNOSIS — R350 Frequency of micturition: Secondary | ICD-10-CM | POA: Diagnosis not present

## 2022-01-04 ENCOUNTER — Encounter: Payer: Self-pay | Admitting: Adult Health

## 2022-01-12 NOTE — Progress Notes (Signed)
[facility] Quality Team Note  Name: Jacob Stone. Date of Birth: 02/05/1951 MRN: [id number] Date: 01/12/2022  [facility] Quality Team has reviewed this patient's chart, please see recommendations below:  [facility] Quality Other; (KED GAP- KIDNEY HEALTH EVALUATION- PATIENT NEEDS URINE MICROALBUMIN/CREATININE RATIO TEST COMPLETED BEFORE END OF YEAR FOR GAP CLOSURE.)

## 2022-03-02 DIAGNOSIS — L821 Other seborrheic keratosis: Secondary | ICD-10-CM | POA: Diagnosis not present

## 2022-03-02 DIAGNOSIS — D225 Melanocytic nevi of trunk: Secondary | ICD-10-CM | POA: Diagnosis not present

## 2022-03-02 DIAGNOSIS — Z85828 Personal history of other malignant neoplasm of skin: Secondary | ICD-10-CM | POA: Diagnosis not present

## 2022-03-02 DIAGNOSIS — D2271 Melanocytic nevi of right lower limb, including hip: Secondary | ICD-10-CM | POA: Diagnosis not present

## 2022-03-02 DIAGNOSIS — L570 Actinic keratosis: Secondary | ICD-10-CM | POA: Diagnosis not present

## 2022-03-02 DIAGNOSIS — D2272 Melanocytic nevi of left lower limb, including hip: Secondary | ICD-10-CM | POA: Diagnosis not present

## 2022-03-23 ENCOUNTER — Other Ambulatory Visit: Payer: Self-pay | Admitting: Adult Health

## 2022-05-15 ENCOUNTER — Other Ambulatory Visit: Payer: Self-pay | Admitting: Adult Health

## 2022-05-15 DIAGNOSIS — E139 Other specified diabetes mellitus without complications: Secondary | ICD-10-CM

## 2022-05-20 ENCOUNTER — Other Ambulatory Visit: Payer: Self-pay | Admitting: Adult Health

## 2022-05-20 DIAGNOSIS — I10 Essential (primary) hypertension: Secondary | ICD-10-CM

## 2022-05-20 DIAGNOSIS — E785 Hyperlipidemia, unspecified: Secondary | ICD-10-CM

## 2022-06-12 ENCOUNTER — Telehealth: Payer: Self-pay | Admitting: Adult Health

## 2022-06-12 NOTE — Telephone Encounter (Signed)
Contacted Carolynn Serve. to schedule their annual wellness visit. Appointment made for 06/21/22.  Rudell Cobb AWV direct phone # [phone]   Due to schedule change 06/19/22 awv has been r/s to 06/21/22.  L/m and also sent my chart message with appt date/time change

## 2022-06-19 ENCOUNTER — Other Ambulatory Visit: Payer: Self-pay | Admitting: Adult Health

## 2022-06-21 ENCOUNTER — Ambulatory Visit (INDEPENDENT_AMBULATORY_CARE_PROVIDER_SITE_OTHER): Payer: Medicare Other

## 2022-06-21 VITALS — BP 118/60 | HR 77 | Temp 98.4°F | Ht 69.0 in | Wt 187.5 lb

## 2022-06-21 DIAGNOSIS — Z0000 Encounter for general adult medical examination without abnormal findings: Secondary | ICD-10-CM

## 2022-06-21 NOTE — Patient Instructions (Addendum)
[name] , Thank you for taking time to come for your Medicare Wellness Visit. I appreciate your ongoing commitment to your health goals. Please review the following plan we discussed and let me know if I can assist you in the future.   These are the goals we discussed:  Goals       Increase physical activity (pt-stated)      Lose about 10lb      Lose weight (pt-stated)      Weight (lb) < 175 lb (79.4 kg) (pt-stated)      Increase water intake; drink water first thing in mornings and often to avoid desire for snacking. Goal to drink 6 bottles of water daily. Important to eat 3 smaller meals daily with healthy snacks in between. Make sure your portion sizes are not too big which is causing you to overeat without realizing it.         This is a list of the screening recommended for you and due dates:  Health Maintenance  Topic Date Due   Hemoglobin A1C  04/01/2022   Eye exam for diabetics  06/21/2022*   Yearly kidney health urinalysis for diabetes  06/22/2022*   COVID-19 Vaccine (5 - 2023-24 season) 07/07/2022*   Zoster (Shingles) Vaccine (2 of 2) 09/21/2022*   Flu Shot  09/07/2022   Yearly kidney function blood test for diabetes  09/30/2022   Complete foot exam   09/30/2022   Medicare Annual Wellness Visit  06/21/2023   DTaP/Tdap/Td vaccine (3 - Tdap) 04/05/2024   Colon Cancer Screening  05/25/2024   Pneumonia Vaccine  Completed   Hepatitis C Screening: USPSTF Recommendation to screen - Ages 73-79 yo.  Completed   HPV Vaccine  Aged Out  *Topic was postponed. The date shown is not the original due date.    Advanced directives: Please bring a copy of your health care power of attorney and living will to the office to be added to your chart at your convenience.   Conditions/risks identified: None  Next appointment: Follow up in one year for your annual wellness visit.   Preventive Care 5 Years and Older, Male  Preventive care refers to lifestyle choices and visits with your  health care provider that can promote health and wellness. What does preventive care include? A yearly physical exam. This is also called an annual well check. Dental exams once or twice a year. Routine eye exams. Ask your health care provider how often you should have your eyes checked. Personal lifestyle choices, including: Daily care of your teeth and gums. Regular physical activity. Eating a healthy diet. Avoiding tobacco and drug use. Limiting alcohol use. Practicing safe sex. Taking low doses of aspirin every day. Taking vitamin and mineral supplements as recommended by your health care provider. What happens during an annual well check? The services and screenings done by your health care provider during your annual well check will depend on your age, overall health, lifestyle risk factors, and family history of disease. Counseling  Your health care provider may ask you questions about your: Alcohol use. Tobacco use. Drug use. Emotional well-being. Home and relationship well-being. Sexual activity. Eating habits. History of falls. Memory and ability to understand (cognition). Work and work Astronomer. Screening  You may have the following tests or measurements: Height, weight, and BMI. Blood pressure. Lipid and cholesterol levels. These may be checked every 5 years, or more frequently if you are over 64 years old. Skin check. Lung cancer screening. You may have  this screening every year starting at age 55 if you have a 30-pack-year history of smoking and currently smoke or have quit within the past 15 years. Fecal occult blood test (FOBT) of the stool. You may have this test every year starting at age 54. Flexible sigmoidoscopy or colonoscopy. You may have a sigmoidoscopy every 5 years or a colonoscopy every 10 years starting at age 54. Prostate cancer screening. Recommendations will vary depending on your family history and other risks. Hepatitis C blood  test. Hepatitis B blood test. Sexually transmitted disease (STD) testing. Diabetes screening. This is done by checking your blood sugar (glucose) after you have not eaten for a while (fasting). You may have this done every 1-3 years. Abdominal aortic aneurysm (AAA) screening. You may need this if you are a current or former smoker. Osteoporosis. You may be screened starting at age 22 if you are at high risk. Talk with your health care provider about your test results, treatment options, and if necessary, the need for more tests. Vaccines  Your health care provider may recommend certain vaccines, such as: Influenza vaccine. This is recommended every year. Tetanus, diphtheria, and acellular pertussis (Tdap, Td) vaccine. You may need a Td booster every 10 years. Zoster vaccine. You may need this after age 69. Pneumococcal 13-valent conjugate (PCV13) vaccine. One dose is recommended after age 61. Pneumococcal polysaccharide (PPSV23) vaccine. One dose is recommended after age 66. Talk to your health care provider about which screenings and vaccines you need and how often you need them. This information is not intended to replace advice given to you by your health care provider. Make sure you discuss any questions you have with your health care provider. Document Released: 02/19/2015 Document Revised: 10/13/2015 Document Reviewed: 11/24/2014 Elsevier Interactive Patient Education  2017 [employer].  Fall Prevention in the Home Falls can cause injuries. They can happen to people of all ages. There are many things you can do to make your home safe and to help prevent falls. What can I do on the outside of my home? Regularly fix the edges of walkways and driveways and fix any cracks. Remove anything that might make you trip as you walk through a door, such as a raised step or threshold. Trim any bushes or trees on the path to your home. Use bright outdoor lighting. Clear any walking paths of  anything that might make someone trip, such as rocks or tools. Regularly check to see if handrails are loose or broken. Make sure that both sides of any steps have handrails. Any raised decks and porches should have guardrails on the edges. Have any leaves, snow, or ice cleared regularly. Use sand or salt on walking paths during winter. Clean up any spills in your garage right away. This includes oil or grease spills. What can I do in the bathroom? Use night lights. Install grab bars by the toilet and in the tub and shower. Do not use towel bars as grab bars. Use non-skid mats or decals in the tub or shower. If you need to sit down in the shower, use a plastic, non-slip stool. Keep the floor dry. Clean up any water that spills on the floor as soon as it happens. Remove soap buildup in the tub or shower regularly. Attach bath mats securely with double-sided non-slip rug tape. Do not have throw rugs and other things on the floor that can make you trip. What can I do in the bedroom? Use night lights. Make sure that  you have a light by your bed that is easy to reach. Do not use any sheets or blankets that are too big for your bed. They should not hang down onto the floor. Have a firm chair that has side arms. You can use this for support while you get dressed. Do not have throw rugs and other things on the floor that can make you trip. What can I do in the kitchen? Clean up any spills right away. Avoid walking on wet floors. Keep items that you use a lot in easy-to-reach places. If you need to reach something above you, use a strong step stool that has a grab bar. Keep electrical cords out of the way. Do not use floor polish or wax that makes floors slippery. If you must use wax, use non-skid floor wax. Do not have throw rugs and other things on the floor that can make you trip. What can I do with my stairs? Do not leave any items on the stairs. Make sure that there are handrails on both  sides of the stairs and use them. Fix handrails that are broken or loose. Make sure that handrails are as long as the stairways. Check any carpeting to make sure that it is firmly attached to the stairs. Fix any carpet that is loose or worn. Avoid having throw rugs at the top or bottom of the stairs. If you do have throw rugs, attach them to the floor with carpet tape. Make sure that you have a light switch at the top of the stairs and the bottom of the stairs. If you do not have them, ask someone to add them for you. What else can I do to help prevent falls? Wear shoes that: Do not have high heels. Have rubber bottoms. Are comfortable and fit you well. Are closed at the toe. Do not wear sandals. If you use a stepladder: Make sure that it is fully opened. Do not climb a closed stepladder. Make sure that both sides of the stepladder are locked into place. Ask someone to hold it for you, if possible. Clearly mark and make sure that you can see: Any grab bars or handrails. First and last steps. Where the edge of each step is. Use tools that help you move around (mobility aids) if they are needed. These include: Canes. Walkers. Scooters. Crutches. Turn on the lights when you go into a dark area. Replace any light bulbs as soon as they burn out. Set up your furniture so you have a clear path. Avoid moving your furniture around. If any of your floors are uneven, fix them. If there are any pets around you, be aware of where they are. Review your medicines with your doctor. Some medicines can make you feel dizzy. This can increase your chance of falling. Ask your doctor what other things that you can do to help prevent falls. This information is not intended to replace advice given to you by your health care provider. Make sure you discuss any questions you have with your health care provider. Document Released: 11/19/2008 Document Revised: 07/01/2015 Document Reviewed: 02/27/2014 Elsevier  Interactive Patient Education  2017 [employer].

## 2022-06-21 NOTE — Progress Notes (Signed)
Subjective:   Jacob Stone. is a 72 y.o. male who presents for Medicare Annual/Subsequent preventive examination.  Review of Systems     Cardiac Risk Factors include: advanced age (>12men, >68 women);male gender;hypertension     Objective:    Today's Vitals   06/21/22 1357  BP: 118/60  Pulse: 77  Temp: 98.4 F (36.9 C)  TempSrc: Oral  SpO2: 95%  Weight: 187 lb 8 oz (85 kg)  Height: 5\' 9"  (1.753 m)   Body mass index is 27.69 kg/m.     06/21/2022    2:15 PM 06/16/2021   11:51 AM 08/04/2020    3:56 PM 06/11/2020    8:32 AM 09/25/2019    2:48 PM 03/18/2019   11:28 AM 12/10/2017   11:38 AM  Advanced Directives  Does Patient Have a Medical Advance Directive? Yes Yes Yes No No No Yes  Type of Estate agent of [place];Living will Healthcare Power of [place];Living will       Does patient want to make changes to medical advance directive?  No - Patient declined No - Patient declined      Copy of Healthcare Power of Attorney in Chart? No - copy requested No - copy requested       Would patient like information on creating a medical advance directive?    No - Patient declined No - Patient declined No - Patient declined     Current Medications (verified) Outpatient Encounter Medications as of 06/21/2022  Medication Sig   allopurinol (ZYLOPRIM) 300 MG tablet TAKE 1 TABLET(300 MG) BY MOUTH DAILY   amLODipine (NORVASC) 10 MG tablet TAKE 1 TABLET(10 MG) BY MOUTH DAILY   aspirin 81 MG chewable tablet Chew by mouth.   cetirizine (ZYRTEC) 10 MG tablet Take 10 mg by mouth daily.   Cholecalciferol (VITAMIN D) 125 MCG (5000 UT) CAPS Take by mouth daily.   Docusate Calcium ([employer] STOOL SOFTENER PO) Take by mouth daily.   losartan (COZAAR) 25 MG tablet TAKE 1 TABLET BY MOUTH DAILY   metFORMIN (GLUCOPHAGE) 500 MG tablet TAKE 1 TABLET BY MOUTH BEFORE BREAKFAST   Misc Natural Products (FIBER 7 PO) Take 2 tablets by mouth daily.    Multiple Vitamins-Minerals (DAILY MULTI  50+ PO) Take 1 tablet by mouth daily.   simvastatin (ZOCOR) 20 MG tablet TAKE 1 TABLET(20 MG) BY MOUTH DAILY   tamsulosin (FLOMAX) 0.4 MG CAPS capsule Take 0.4 mg by mouth daily.   [DISCONTINUED] cyclobenzaprine (FLEXERIL) 10 MG tablet Take 1 tablet (10 mg total) by mouth at bedtime.   [DISCONTINUED] hydrochlorothiazide (HYDRODIURIL) 12.5 MG tablet TAKE 1 TABLET BY MOUTH DAILY   [DISCONTINUED] methylPREDNISolone (MEDROL DOSEPAK) 4 MG TBPK tablet Take as directed   No facility-administered encounter medications on file as of 06/21/2022.    Allergies (verified) Oxycodone   History: Past Medical History:  Diagnosis Date   Carotid artery calcification 03/2011   MILD HARD PLAQUE LEFT BULB, 0-39% BIL. ICA STENOSIS, PATENT VERTEBRAL ARTERIES WITH ANTEGRADE FLOW   Diabetes type 2, controlled (HCC)    Diverticulitis    Fatty liver    FH: kidney cancer 2005   right kidney removed    GERD (gastroesophageal reflux disease)    Gout    History of kidney stones    Left kidney   HTN (hypertension)    Hyperlipidemia    OA (osteoarthritis)    Knee   OSA (obstructive sleep apnea) 04/18/2016   SEVERE; UNABLE TO WEAR CPAP MORE  THAN 2-3 HOURS PER NIGHT   Plantar fasciitis    Prostate cancer (HCC)    Seasonal allergies    Past Surgical History:  Procedure Laterality Date   COLON SURGERY  2011   Partial colon removed due to diverticulitis   COLONOSCOPY  08/2009   NEPHRECTOMY Right    PROSTATE BIOPSY     RADIOACTIVE SEED IMPLANT N/A 08/03/2017   Procedure: RADIOACTIVE SEED IMPLANT/BRACHYTHERAPY IMPLANT;  Surgeon: Ihor Gully, MD;  Location: [facility];  Service: Urology;  Laterality: N/A;   SPACE OAR INSTILLATION N/A 08/03/2017   Procedure: SPACE OAR INSTILLATION;  Surgeon: Ihor Gully, MD;  Location: [facility];  Service: Urology;  Laterality: N/A;   Family History  Problem Relation Age of Onset   Heart disease Mother        heart failure - 44 and 29 when  she passed    Stroke Mother    Cancer Father        wagners   Glaucoma Father        wagoner's glaucoma   Hypertension Other        multiple family members   Social History   Socioeconomic History   Marital status: Married    Spouse name: Not on file   Number of children: Not on file   Years of education: Not on file   Highest education level: Not on file  Occupational History   Occupation: retired    Comment: Designer, industrial/product   Occupation: part time    Comment: golf course  Tobacco Use   Smoking status: Never   Smokeless tobacco: Never  Vaping Use   Vaping Use: Never used  Substance and Sexual Activity   Alcohol use: Yes    Alcohol/week: 1.0 standard drink of alcohol    Types: 1 Cans of beer per week    Comment: rare   Drug use: No   Sexual activity: Yes  Other Topics Concern   Not on file  Social History Narrative   HH 2   Married wife with compromised immune system   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 - > 5 miles per day   Social Determinants of Health   Financial Resource Strain: Low Risk  (06/21/2022)   Overall Financial Resource Strain (CARDIA)    Difficulty of Paying Living Expenses: Not hard at all  Food Insecurity: No Food Insecurity (06/21/2022)   Hunger Vital Sign    Worried About Running Out of Food in the Last Year: Never true    Ran Out of Food in the Last Year: Never true  Transportation Needs: No Transportation Needs (06/21/2022)   PRAPARE - Administrator, Civil Service (Medical): No    Lack of Transportation (Non-Medical): No  Physical Activity: Insufficiently Active (06/21/2022)   Exercise Vital Sign    Days of Exercise per Week: 3 days    Minutes of Exercise per Session: 40 min  Stress: No Stress Concern Present (06/21/2022)   [employer] of Occupational Health - Occupational Stress Questionnaire    Feeling of Stress : Not at all  Social Connections: Socially Integrated (06/21/2022)    Social Connection and Isolation Panel [NHANES]    Frequency of Communication with Friends and Family: More than three times a week    Frequency of Social Gatherings with Friends and Family: More than three times a week    Attends Religious Services: More than 4 times per year  Active Member of Clubs or Organizations: Yes    Attends Banker Meetings: More than 4 times per year    Marital Status: Married    Tobacco Counseling Counseling given: Not Answered   Clinical Intake:  Pre-visit preparation completed: Yes  Pain : No/denies pain     BMI - recorded: 27.69 Nutritional Status: BMI 25 -29 Overweight Nutritional Risks: None Diabetes: No  How often do you need to have someone help you when you read instructions, pamphlets, or other written materials from your doctor or pharmacy?: 1 - Never  Diabetic?  No  Interpreter Needed?: No  Information entered by :: Theresa Mulligan LPN   Activities of Daily Living    06/21/2022    2:14 PM 06/21/2022    2:13 PM  In your present state of health, do you have any difficulty performing the following activities:  Hearing? 0 0  Vision? 1 1  Comment  Pending appt  Difficulty concentrating or making decisions? 0 0  Walking or climbing stairs? 0 0  Dressing or bathing? 0 0  Doing errands, shopping? 0 0  Preparing Food and eating ?  N  Using the Toilet?  N  In the past six months, have you accidently leaked urine?  N  Do you have problems with loss of bowel control?  N  Managing your Medications?  N  Managing your Finances?  N  Housekeeping or managing your Housekeeping?  N    Patient Care Team: Shirline Frees, NP as PCP - General (Family Medicine) Tyrell Antonio, MD as Consulting Physician (Physical Medicine and Rehabilitation) Valeria Batman, MD (Inactive) as Consulting Physician (Orthopedic Surgery)  Indicate any recent Medical Services you may have received from other than [facility] providers in the past year  (date may be approximate).     Assessment:   This is a routine wellness examination for Jacob Stone.  Hearing/Vision screen Hearing Screening - Comments:: Denies hearing difficulties   Vision Screening - Comments:: Wears rx glasses - up to date with routine eye exams with  Dr Nile Riggs  Dietary issues and exercise activities discussed: Current Exercise Habits: Home exercise routine, Type of exercise: walking, Time (Minutes): 40, Frequency (Times/Week): 3, Weekly Exercise (Minutes/Week): 120, Intensity: Moderate, Exercise limited by: None identified   Goals Addressed               This Visit's Progress     Lose weight (pt-stated)         Depression Screen    06/21/2022    1:57 PM 11/01/2021    9:37 AM 09/29/2021    9:34 AM 06/16/2021   11:44 AM 03/30/2021    4:19 PM 09/23/2020    7:11 AM 08/04/2020    3:56 PM  PHQ 2/9 Scores  PHQ - 2 Score 0 0 0 0 0 0 0  PHQ- 9 Score  0 2        Fall Risk    06/21/2022    2:14 PM 06/20/2022    2:43 PM 11/01/2021    9:37 AM 06/16/2021   11:48 AM 09/23/2020    7:10 AM  Fall Risk   Falls in the past year? 0 0 0 1 0  Number falls in past yr: 0 0 0 0 0  Injury with Fall? 0 0 0 0 0  Comment    No injury or medicial attention needed   Risk for fall due to : No Fall Risks  No Fall Risks No Fall Risks  Follow up Falls prevention discussed  Falls evaluation completed      FALL RISK PREVENTION PERTAINING TO THE HOME:  Any stairs in or around the home? Yes  If so, are there any without handrails? No  Home free of loose throw rugs in walkways, pet beds, electrical cords, etc? Yes  Adequate lighting in your home to reduce risk of falls? Yes   ASSISTIVE DEVICES UTILIZED TO PREVENT FALLS:  Life alert? No  Use of a cane, walker or w/c? No  Grab bars in the bathroom? Yes  Shower chair or bench in shower? No  Elevated toilet seat or a handicapped toilet? No   TIMED UP AND GO:  Was the test performed? Yes .  Length of time to ambulate 10 feet: 10  sec.   Gait steady and fast without use of assistive device  Cognitive Function:        06/21/2022    2:15 PM 06/16/2021   11:51 AM 03/18/2019   11:30 AM  6CIT Screen  What Year? 0 points 0 points 0 points  What month? 0 points 0 points 0 points  What time? 0 points 0 points 0 points  Count back from 20 0 points 0 points 0 points  Months in reverse 0 points 0 points 0 points  Repeat phrase 2 points 0 points 0 points  Total Score 2 points 0 points 0 points    Immunizations Immunization History  Administered Date(s) Administered   Fluad Quad(high Dose 65+) 11/06/2018, 11/09/2020   Influenza Split 11/18/2010, 11/10/2011   Influenza Whole 11/09/2009   Influenza, High Dose Seasonal PF 11/25/2016, 11/23/2017   Influenza,inj,Quad PF,6+ Mos 11/28/2012   Influenza-Unspecified 12/02/2013, 11/28/2014, 11/21/2015   PFIZER Comirnaty(Gray Top)Covid-19 Tri-Sucrose Vaccine 06/02/2020   PFIZER(Purple Top)SARS-COV-2 Vaccination 03/22/2019, 04/16/2019, 11/05/2019   Pneumococcal Conjugate-13 04/06/2014   Pneumococcal Polysaccharide-23 06/18/2017   Td 07/30/2002, 04/06/2014   Zoster Recombinat (Shingrix) 06/23/2020   Zoster, Live 03/02/2011    TDAP status: Up to date  Flu Vaccine status: Up to date  Pneumococcal vaccine status: Up to date  Covid-19 vaccine status: Completed vaccines  Qualifies for Shingles Vaccine? Yes   Zostavax completed Yes   Shingrix Completed?: Yes  Screening Tests Health Maintenance  Topic Date Due   HEMOGLOBIN A1C  04/01/2022   OPHTHALMOLOGY EXAM  06/21/2022 (Originally 04/20/2021)   Diabetic kidney evaluation - Urine ACR  06/22/2022 (Originally 06/12/2017)   COVID-19 Vaccine (5 - 2023-24 season) 07/07/2022 (Originally 10/07/2021)   Zoster Vaccines- Shingrix (2 of 2) 09/21/2022 (Originally 08/18/2020)   INFLUENZA VACCINE  09/07/2022   Diabetic kidney evaluation - eGFR measurement  09/30/2022   FOOT EXAM  09/30/2022   Medicare Annual Wellness (AWV)  06/21/2023    DTaP/Tdap/Td (3 - Tdap) 04/05/2024   COLONOSCOPY (Pts 45-38yrs Insurance coverage will need to be confirmed)  05/25/2024   Pneumonia Vaccine 65+ Years old  Completed   Hepatitis C Screening  Completed   HPV VACCINES  Aged Out    Health Maintenance  Health Maintenance Due  Topic Date Due   HEMOGLOBIN A1C  04/01/2022    Colorectal cancer screening: Type of screening: Colonoscopy. Completed 05/26/19. Repeat every 5 years  Lung Cancer Screening: (Low Dose CT Chest recommended if Age 66-80 years, 30 pack-year currently smoking OR have quit w/in 15years.) does not qualify.     Additional Screening:  Hepatitis C Screening: does qualify; Completed 07/25/18  Vision Screening: Recommended annual ophthalmology exams for early detection of glaucoma and other disorders of the  eye. Is the patient up to date with their annual eye exam?  Yes  Who is the provider or what is the name of the office in which the patient attends annual eye exams? Dr Nile Riggs If pt is not established with a provider, would they like to be referred to a provider to establish care? No .   Dental Screening: Recommended annual dental exams for proper oral hygiene  Community Resource Referral / Chronic Care Management:  CRR required this visit?  No   CCM required this visit?  No      Plan:     I have personally reviewed and noted the following in the patient's chart:   Medical and social history Use of alcohol, tobacco or illicit drugs  Current medications and supplements including opioid prescriptions. Patient is not currently taking opioid prescriptions. Functional ability and status Nutritional status Physical activity Advanced directives List of other physicians Hospitalizations, surgeries, and ER visits in previous 12 months Vitals Screenings to include cognitive, depression, and falls Referrals and appointments  In addition, I have reviewed and discussed with patient certain preventive protocols,  quality metrics, and best practice recommendations. A written personalized care plan for preventive services as well as general preventive health recommendations were provided to patient.     Tillie Rung, LPN   1/61/0960   Nurse Notes: Patient due Diabetic kidney evaluation-Urine ACR and Hemoglobin A1C

## 2022-06-29 ENCOUNTER — Encounter: Payer: Self-pay | Admitting: Adult Health

## 2022-06-29 ENCOUNTER — Ambulatory Visit (INDEPENDENT_AMBULATORY_CARE_PROVIDER_SITE_OTHER): Payer: Medicare Other | Admitting: Adult Health

## 2022-06-29 VITALS — BP 130/80 | HR 70 | Temp 97.6°F | Ht 69.0 in | Wt 191.0 lb

## 2022-06-29 DIAGNOSIS — E1169 Type 2 diabetes mellitus with other specified complication: Secondary | ICD-10-CM

## 2022-06-29 DIAGNOSIS — Z7984 Long term (current) use of oral hypoglycemic drugs: Secondary | ICD-10-CM

## 2022-06-29 DIAGNOSIS — S29012A Strain of muscle and tendon of back wall of thorax, initial encounter: Secondary | ICD-10-CM | POA: Diagnosis not present

## 2022-06-29 DIAGNOSIS — I10 Essential (primary) hypertension: Secondary | ICD-10-CM | POA: Diagnosis not present

## 2022-06-29 DIAGNOSIS — T148XXA Other injury of unspecified body region, initial encounter: Secondary | ICD-10-CM

## 2022-06-29 MED ORDER — CYCLOBENZAPRINE HCL 10 MG PO TABS: 10.0000 mg | ORAL_TABLET | Freq: Three times a day (TID) | ORAL | 0 refills | Status: DC | PRN

## 2022-06-29 NOTE — Progress Notes (Signed)
Subjective:    Patient ID: Jacob Serve., male    DOB: 1950-10-17, 72 y.o.   MRN: [id number]  Muscle Pain   72 year old male who  has a past medical history of Carotid artery calcification (03/2011), Diabetes type 2, controlled (HCC), Diverticulitis, Fatty liver, FH: kidney cancer (2005), GERD (gastroesophageal reflux disease), Gout, History of kidney stones, HTN (hypertension), Hyperlipidemia, OA (osteoarthritis), OSA (obstructive sleep apnea) (04/18/2016), Plantar fasciitis, Prostate cancer (HCC), and Seasonal allergies.  He presents to the office today for an acute issue of left sided upper back pain. His symptoms have been present for the last 4 days. Pain is located between shoulder blades He denies pain or aggravating factors but he does lift heavy objects . He has been using tylenol and a heating pad without relief. Certain movements make the pain worse.   He is also do for follow up regarding DM Type 2 and HTN  Diabetes mellitus type II-he is managed with metformin 500 mg daily.  He does not monitor his blood sugars at home.  Denies episodes of hypoglycemia.  His A1c has been controlled for a number of years.   Lab Results  Component Value Date   HGBA1C 7.2 (H) 09/29/2021   Hypertension -managed with Norvasc 10 mg daily, HCTZ 25 mg daily, and Cozaar 25 mg daily.  He denies dizziness, lightheadedness, shortness of breath, or chest pain BP Readings from Last 3 Encounters:  06/29/22 130/80  06/21/22 118/60  11/01/21 110/66   Review of Systems See HPI   Past Medical History:  Diagnosis Date   Carotid artery calcification 03/2011   MILD HARD PLAQUE LEFT BULB, 0-39% BIL. ICA STENOSIS, PATENT VERTEBRAL ARTERIES WITH ANTEGRADE FLOW   Diabetes type 2, controlled (HCC)    Diverticulitis    Fatty liver    FH: kidney cancer 2005   right kidney removed    GERD (gastroesophageal reflux disease)    Gout    History of kidney stones    Left kidney   HTN (hypertension)     Hyperlipidemia    OA (osteoarthritis)    Knee   OSA (obstructive sleep apnea) 04/18/2016   SEVERE; UNABLE TO WEAR CPAP MORE THAN 2-3 HOURS PER NIGHT   Plantar fasciitis    Prostate cancer (HCC)    Seasonal allergies     Social History   Socioeconomic History   Marital status: Married    Spouse name: Not on file   Number of children: Not on file   Years of education: Not on file   Highest education level: Not on file  Occupational History   Occupation: retired    Comment: Designer, industrial/product   Occupation: part time    Comment: golf course  Tobacco Use   Smoking status: Never   Smokeless tobacco: Never  Vaping Use   Vaping Use: Never used  Substance and Sexual Activity   Alcohol use: Yes    Alcohol/week: 1.0 standard drink of alcohol    Types: 1 Cans of beer per week    Comment: rare   Drug use: No   Sexual activity: Yes  Other Topics Concern   Not on file  Social History Narrative   HH 2   Married wife with compromised immune system   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 - > 5 miles per day   Social Determinants of Health   Financial Resource Strain: Low Risk  (06/21/2022)   Overall Financial  Resource Strain (CARDIA)    Difficulty of Paying Living Expenses: Not hard at all  Food Insecurity: No Food Insecurity (06/21/2022)   Hunger Vital Sign    Worried About Running Out of Food in the Last Year: Never true    Ran Out of Food in the Last Year: Never true  Transportation Needs: No Transportation Needs (06/21/2022)   PRAPARE - Administrator, Civil Service (Medical): No    Lack of Transportation (Non-Medical): No  Physical Activity: Insufficiently Active (06/21/2022)   Exercise Vital Sign    Days of Exercise per Week: 3 days    Minutes of Exercise per Session: 40 min  Stress: No Stress Concern Present (06/21/2022)   [employer] of Occupational Health - Occupational Stress Questionnaire    Feeling of Stress : Not at  all  Social Connections: Socially Integrated (06/21/2022)   Social Connection and Isolation Panel [NHANES]    Frequency of Communication with Friends and Family: More than three times a week    Frequency of Social Gatherings with Friends and Family: More than three times a week    Attends Religious Services: More than 4 times per year    Active Member of Clubs or Organizations: Yes    Attends Banker Meetings: More than 4 times per year    Marital Status: Married  Catering manager Violence: Not At Risk (06/21/2022)   Humiliation, Afraid, Rape, and Kick questionnaire    Fear of Current or Ex-Partner: No    Emotionally Abused: No    Physically Abused: No    Sexually Abused: No    Past Surgical History:  Procedure Laterality Date   COLON SURGERY  2011   Partial colon removed due to diverticulitis   COLONOSCOPY  08/2009   NEPHRECTOMY Right    PROSTATE BIOPSY     RADIOACTIVE SEED IMPLANT N/A 08/03/2017   Procedure: RADIOACTIVE SEED IMPLANT/BRACHYTHERAPY IMPLANT;  Surgeon: Ihor Gully, MD;  Location: [facility];  Service: Urology;  Laterality: N/A;   SPACE OAR INSTILLATION N/A 08/03/2017   Procedure: SPACE OAR INSTILLATION;  Surgeon: Ihor Gully, MD;  Location: [facility];  Service: Urology;  Laterality: N/A;    Family History  Problem Relation Age of Onset   Heart disease Mother        heart failure - 48 and 41 when she passed    Stroke Mother    Cancer Father        wagners   Glaucoma Father        wagoner's glaucoma   Hypertension Other        multiple family members    Allergies  Allergen Reactions   Oxycodone Other (See Comments)    Patient stated he has nightmares with oxycodone    Current Outpatient Medications on File Prior to Visit  Medication Sig Dispense Refill   allopurinol (ZYLOPRIM) 300 MG tablet TAKE 1 TABLET(300 MG) BY MOUTH DAILY 90 tablet 3   amLODipine (NORVASC) 10 MG tablet TAKE 1 TABLET(10 MG) BY MOUTH  DAILY 90 tablet 3   aspirin 81 MG chewable tablet Chew by mouth.     cetirizine (ZYRTEC) 10 MG tablet Take 10 mg by mouth daily.     Cholecalciferol (VITAMIN D) 125 MCG (5000 UT) CAPS Take by mouth daily.     Docusate Calcium ([employer] STOOL SOFTENER PO) Take by mouth daily.     losartan (COZAAR) 25 MG tablet TAKE 1 TABLET BY MOUTH DAILY 90 tablet 3  metFORMIN (GLUCOPHAGE) 500 MG tablet TAKE 1 TABLET BY MOUTH BEFORE BREAKFAST 90 tablet 0   Misc Natural Products (FIBER 7 PO) Take 2 tablets by mouth daily.      Multiple Vitamins-Minerals (DAILY MULTI 50+ PO) Take 1 tablet by mouth daily.     simvastatin (ZOCOR) 20 MG tablet TAKE 1 TABLET(20 MG) BY MOUTH DAILY 90 tablet 3   tamsulosin (FLOMAX) 0.4 MG CAPS capsule Take 0.4 mg by mouth daily.     No current facility-administered medications on file prior to visit.    BP 130/80   Pulse 70   Temp 97.6 F (36.4 C) (Oral)   Ht 5\' 9"  (1.753 m)   Wt 191 lb (86.6 kg)   SpO2 99%   BMI 28.21 kg/m       Objective:   Physical Exam Vitals and nursing note reviewed.  Constitutional:      Appearance: Normal appearance. He is obese.  Cardiovascular:     Rate and Rhythm: Normal rate and regular rhythm.     Pulses: Normal pulses.     Heart sounds: Normal heart sounds.  Pulmonary:     Effort: Pulmonary effort is normal.     Breath sounds: Normal breath sounds.  Musculoskeletal:     Cervical back: Tenderness present. No bony tenderness. Pain with movement present. Normal range of motion.       Back:  Neurological:     General: No focal deficit present.     Mental Status: He is alert and oriented to person, place, and time.  Psychiatric:        Mood and Affect: Mood normal.        Behavior: Behavior normal.        Thought Content: Thought content normal.        Judgment: Judgment normal.       Assessment & Plan:  1. Muscle strain - Continue with heating pad and start doing gentle stretching exercises. Follow up if not improved in the next  week  - cyclobenzaprine (FLEXERIL) 10 MG tablet; Take 1 tablet (10 mg total) by mouth 3 (three) times daily as needed for muscle spasms.  Dispense: 15 tablet; Refill: 0  2. Type 2 diabetes mellitus with other specified complication, without long-term current use of insulin (HCC)  - POC HgB A1c- 6.4  - Has improved and at goal  - Follow up for CPE in 3 months   3. Essential hypertension - Well controlled. No change in medication    Shirline Frees, NP

## 2022-06-30 LAB — POCT GLYCOSYLATED HEMOGLOBIN (HGB A1C): Hemoglobin A1C: 6.4 % — AB (ref 4.0–5.6)

## 2022-07-18 DIAGNOSIS — H524 Presbyopia: Secondary | ICD-10-CM | POA: Diagnosis not present

## 2022-07-18 DIAGNOSIS — Z7984 Long term (current) use of oral hypoglycemic drugs: Secondary | ICD-10-CM | POA: Diagnosis not present

## 2022-07-18 DIAGNOSIS — E1136 Type 2 diabetes mellitus with diabetic cataract: Secondary | ICD-10-CM | POA: Diagnosis not present

## 2022-07-18 DIAGNOSIS — H25813 Combined forms of age-related cataract, bilateral: Secondary | ICD-10-CM | POA: Diagnosis not present

## 2022-08-14 ENCOUNTER — Other Ambulatory Visit: Payer: Self-pay | Admitting: Adult Health

## 2022-08-14 DIAGNOSIS — E139 Other specified diabetes mellitus without complications: Secondary | ICD-10-CM

## 2022-09-05 ENCOUNTER — Telehealth: Payer: Self-pay | Admitting: Adult Health

## 2022-09-05 DIAGNOSIS — M65332 Trigger finger, left middle finger: Secondary | ICD-10-CM

## 2022-09-05 NOTE — Telephone Encounter (Signed)
Pt is calling and would like to have a referral to dr Amanda Pea due to trigger finger.

## 2022-09-06 ENCOUNTER — Encounter (INDEPENDENT_AMBULATORY_CARE_PROVIDER_SITE_OTHER): Payer: Self-pay

## 2022-09-06 NOTE — Telephone Encounter (Signed)
Spoke to Jacob Stone and he stated that he tried the Cortizone injection and it didn't work. Jacob Stone stated that he wants to see Dr. Amanda Pea to see if something else could be done.

## 2022-09-06 NOTE — Telephone Encounter (Signed)
Please advise if pt needs an appt. Tried to call pt to see if this was addressed before but no answer.

## 2022-09-06 NOTE — Telephone Encounter (Signed)
Referral has been placed! Tried to call pt to advise but no answer.

## 2022-09-14 ENCOUNTER — Telehealth: Payer: Self-pay | Admitting: Adult Health

## 2022-09-14 DIAGNOSIS — T148XXA Other injury of unspecified body region, initial encounter: Secondary | ICD-10-CM

## 2022-09-14 DIAGNOSIS — M65332 Trigger finger, left middle finger: Secondary | ICD-10-CM | POA: Diagnosis not present

## 2022-09-14 NOTE — Telephone Encounter (Signed)
Prescription Request  09/14/2022  LOV: 06/29/2022  What is the name of the medication or equipment? cyclobenzaprine (FLEXERIL) 10 MG tablet   Have you contacted your pharmacy to request a refill? No   Which pharmacy would you like this sent to?   [id number] [place], [place] - [address] AT [facility] [address] [facility] [address] [facility] [address] [place] [address] Phone: [phone] Fax: [phone]    Patient notified that their request is being sent to the clinical staff for review and that they should receive a response within 2 business days.   Please advise at Mobile [phone] (mobile)

## 2022-09-15 MED ORDER — CYCLOBENZAPRINE HCL 10 MG PO TABS: 10.0000 mg | ORAL_TABLET | Freq: Three times a day (TID) | ORAL | 0 refills | Status: DC | PRN

## 2022-09-15 NOTE — Addendum Note (Signed)
Addended by: Waymon Amato R on: 09/15/2022 03:13 PM   Modules accepted: Orders

## 2022-09-15 NOTE — Telephone Encounter (Signed)
Okay for refill?  

## 2022-09-15 NOTE — Telephone Encounter (Signed)
Prescription sent to pharmacy. No other action needed. Pt informed! 

## 2022-10-03 ENCOUNTER — Ambulatory Visit (INDEPENDENT_AMBULATORY_CARE_PROVIDER_SITE_OTHER): Payer: Medicare Other | Admitting: Adult Health

## 2022-10-03 ENCOUNTER — Encounter: Payer: Self-pay | Admitting: Adult Health

## 2022-10-03 VITALS — BP 110/80 | HR 84 | Temp 97.9°F | Ht 68.0 in | Wt 187.0 lb

## 2022-10-03 DIAGNOSIS — E559 Vitamin D deficiency, unspecified: Secondary | ICD-10-CM

## 2022-10-03 DIAGNOSIS — T148XXA Other injury of unspecified body region, initial encounter: Secondary | ICD-10-CM

## 2022-10-03 DIAGNOSIS — M109 Gout, unspecified: Secondary | ICD-10-CM

## 2022-10-03 DIAGNOSIS — E785 Hyperlipidemia, unspecified: Secondary | ICD-10-CM

## 2022-10-03 DIAGNOSIS — I10 Essential (primary) hypertension: Secondary | ICD-10-CM | POA: Diagnosis not present

## 2022-10-03 DIAGNOSIS — Z7984 Long term (current) use of oral hypoglycemic drugs: Secondary | ICD-10-CM

## 2022-10-03 DIAGNOSIS — Z0000 Encounter for general adult medical examination without abnormal findings: Secondary | ICD-10-CM

## 2022-10-03 DIAGNOSIS — Z8546 Personal history of malignant neoplasm of prostate: Secondary | ICD-10-CM

## 2022-10-03 DIAGNOSIS — E119 Type 2 diabetes mellitus without complications: Secondary | ICD-10-CM | POA: Diagnosis not present

## 2022-10-03 DIAGNOSIS — G4733 Obstructive sleep apnea (adult) (pediatric): Secondary | ICD-10-CM | POA: Diagnosis not present

## 2022-10-03 LAB — TSH: TSH: 1.99 u[IU]/mL (ref 0.35–5.50)

## 2022-10-03 LAB — CBC
HCT: 47.6 % (ref 39.0–52.0)
Hemoglobin: 15.9 g/dL (ref 13.0–17.0)
MCHC: 33.5 g/dL (ref 30.0–36.0)
MCV: 95.4 fl (ref 78.0–100.0)
Platelets: 265 10*3/uL (ref 150.0–400.0)
RBC: 4.99 Mil/uL (ref 4.22–5.81)
RDW: 13.6 % (ref 11.5–15.5)
WBC: 7.3 10*3/uL (ref 4.0–10.5)

## 2022-10-03 LAB — LIPID PANEL
Cholesterol: 132 mg/dL (ref 0–200)
HDL: 44.4 mg/dL (ref >39.00)
NonHDL: 87.72
Total CHOL/HDL Ratio: 3
Triglycerides: 273 mg/dL — ABNORMAL HIGH (ref 0.0–149.0)
VLDL: 54.6 mg/dL — ABNORMAL HIGH (ref 0.0–40.0)

## 2022-10-03 LAB — LDL CHOLESTEROL, DIRECT: Direct LDL: 76 mg/dL

## 2022-10-03 LAB — COMPREHENSIVE METABOLIC PANEL
ALT: 40 U/L (ref 0–53)
AST: 28 U/L (ref 0–37)
Albumin: 4.5 g/dL (ref 3.5–5.2)
Alkaline Phosphatase: 65 U/L (ref 39–117)
BUN: 17 mg/dL (ref 6–23)
CO2: 27 mEq/L (ref 19–32)
Calcium: 9.8 mg/dL (ref 8.4–10.5)
Chloride: 103 mEq/L (ref 96–112)
Creatinine, Ser: 1.22 mg/dL (ref 0.40–1.50)
GFR: 58.35 mL/min — ABNORMAL LOW (ref >60.00)
Glucose, Bld: 116 mg/dL — ABNORMAL HIGH (ref 70–99)
Potassium: 4.5 mEq/L (ref 3.5–5.1)
Sodium: 141 mEq/L (ref 135–145)
Total Bilirubin: 0.8 mg/dL (ref 0.2–1.2)
Total Protein: 7.3 g/dL (ref 6.0–8.3)

## 2022-10-03 LAB — VITAMIN D 25 HYDROXY (VIT D DEFICIENCY, FRACTURES): VITD: 53.2 ng/mL (ref 30.00–100.00)

## 2022-10-03 LAB — PSA: PSA: 0.11 ng/mL (ref 0.10–4.00)

## 2022-10-03 LAB — HEMOGLOBIN A1C: Hgb A1c MFr Bld: 6.8 % — ABNORMAL HIGH (ref 4.6–6.5)

## 2022-10-03 LAB — MICROALBUMIN / CREATININE URINE RATIO
Creatinine,U: 214.4 mg/dL
Microalb Creat Ratio: 5.3 mg/g (ref 0.0–30.0)
Microalb, Ur: 11.3 mg/dL — ABNORMAL HIGH (ref 0.0–1.9)

## 2022-10-03 NOTE — Patient Instructions (Signed)
It was great seeing you today   We will follow up with you regarding your lab work   Please let me know if you need anything   

## 2022-10-03 NOTE — Progress Notes (Signed)
Subjective:    Patient ID: Jacob Serve., male    DOB: 1950/07/25, 72 y.o.   MRN: [id number]  HPI Patient presents for yearly preventative medicine examination. He is a pleasant 72 year old male who  has a past medical history of Carotid artery calcification (03/2011), Diabetes type 2, controlled (HCC), Diverticulitis, Fatty liver, FH: kidney cancer (2005), GERD (gastroesophageal reflux disease), Gout, History of kidney stones, HTN (hypertension), Hyperlipidemia, OA (osteoarthritis), OSA (obstructive sleep apnea) (04/18/2016), Plantar fasciitis, Prostate cancer (HCC), and Seasonal allergies.  Diabetes mellitus type II-he is managed with metformin 500 mg daily.  He does not monitor his blood sugars at home.  Denies episodes of hypoglycemia.  His A1c has been controlled for a number of years.   Lab Results  Component Value Date   HGBA1C 6.4 (A) 06/30/2022   Hypertension -managed with Norvasc 10 mg daily, HCTZ 25 mg daily, and Cozaar 25 mg daily.  He denies dizziness, lightheadedness, shortness of breath, or chest pain BP Readings from Last 3 Encounters:  06/29/22 130/80  06/21/22 118/60  11/01/21 110/66   Hyperlipidemia-managed with simvastatin 20 mg, he denies myalgia Lab Results  Component Value Date   CHOL 113 09/29/2021   HDL 41.80 09/29/2021   LDLCALC 29 09/23/2020   LDLDIRECT 50.0 09/29/2021   TRIG 265.0 (H) 09/29/2021   CHOLHDL 3 09/29/2021   History of gout-currently controlled on allopurinol 300 mg daily.  He denies any recent gout flares Lab Results  Component Value Date   LABURIC 7.8 04/09/2014   History of prostate cancer-had seed implant in June 2019.  He is followed by [facility] urology every 6 months.He is taking Flomax 0.4 mg to help with urgency.   Vitamin D Deficiency - takes 1000 units daily  Last vitamin D Lab Results  Component Value Date   VD25OH 67.54 09/29/2021   OSA - was last seen by pulmonary in 2019 - he cannot wear his CPAP due to being on a  full face mask. He reports chronic fatigue and poor sleep . Is interested in being referred back to pulmonary to discuss the [facility] device.  He dos report fatigue most days of the week.   Muscle Strain -he was seen back in May 2024 for an acute issue of left-sided upper back pain.  His pain was located between the shoulder blades.  He was using a heating pad and gentle stretching exercises and we prescribed him a course of Flexeril that he could take as needed.  Reports that he continues to have intermittent episodes of muscle pain in this area that is more apparent when he does heavier lifting at the gym.  He is wondering if he can have a referral to physical therapy for possible dry needling.   All immunizations and health maintenance protocols were reviewed with the patient and needed orders were placed.  Appropriate screening laboratory values were ordered for the patient including screening of hyperlipidemia, renal function and hepatic function. If indicated by BPH, a PSA was ordered.  Medication reconciliation,  past medical history, social history, problem list and allergies were reviewed in detail with the patient  Goals were established with regard to weight loss, exercise, and  diet in compliance with medications Wt Readings from Last 3 Encounters:  06/29/22 191 lb (86.6 kg)  06/21/22 187 lb 8 oz (85 kg)  11/01/21 185 lb 9.6 oz (84.2 kg)    He is up to date on routine colon cancer screening  Review of Systems  Constitutional: Negative.   HENT: Negative.    Eyes: Negative.   Respiratory: Negative.    Cardiovascular: Negative.   Gastrointestinal: Negative.   Endocrine: Negative.   Genitourinary: Negative.   Musculoskeletal: Negative.   Skin: Negative.   Allergic/Immunologic: Negative.   Neurological: Negative.   Hematological: Negative.   Psychiatric/Behavioral: Negative.    All other systems reviewed and are negative.  Past Medical History:  Diagnosis Date    Carotid artery calcification 03/2011   MILD HARD PLAQUE LEFT BULB, 0-39% BIL. ICA STENOSIS, PATENT VERTEBRAL ARTERIES WITH ANTEGRADE FLOW   Diabetes type 2, controlled (HCC)    Diverticulitis    Fatty liver    FH: kidney cancer 2005   right kidney removed    GERD (gastroesophageal reflux disease)    Gout    History of kidney stones    Left kidney   HTN (hypertension)    Hyperlipidemia    OA (osteoarthritis)    Knee   OSA (obstructive sleep apnea) 04/18/2016   SEVERE; UNABLE TO WEAR CPAP MORE THAN 2-3 HOURS PER NIGHT   Plantar fasciitis    Prostate cancer (HCC)    Seasonal allergies     Social History   Socioeconomic History   Marital status: Married    Spouse name: Not on file   Number of children: Not on file   Years of education: Not on file   Highest education level: Not on file  Occupational History   Occupation: retired    Comment: Designer, industrial/product   Occupation: part time    Comment: golf course  Tobacco Use   Smoking status: Never   Smokeless tobacco: Never  Vaping Use   Vaping status: Never Used  Substance and Sexual Activity   Alcohol use: Yes    Alcohol/week: 1.0 standard drink of alcohol    Types: 1 Cans of beer per week    Comment: rare   Drug use: No   Sexual activity: Yes  Other Topics Concern   Not on file  Social History Narrative   HH 2   Married wife with compromised immune system   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 - > 5 miles per day   Social Determinants of Health   Financial Resource Strain: Low Risk  (06/21/2022)   Overall Financial Resource Strain (CARDIA)    Difficulty of Paying Living Expenses: Not hard at all  Food Insecurity: No Food Insecurity (06/21/2022)   Hunger Vital Sign    Worried About Running Out of Food in the Last Year: Never true    Ran Out of Food in the Last Year: Never true  Transportation Needs: No Transportation Needs (06/21/2022)   PRAPARE - Scientist, research (physical sciences) (Medical): No    Lack of Transportation (Non-Medical): No  Physical Activity: Insufficiently Active (06/21/2022)   Exercise Vital Sign    Days of Exercise per Week: 3 days    Minutes of Exercise per Session: 40 min  Stress: No Stress Concern Present (06/21/2022)   [employer] of Occupational Health - Occupational Stress Questionnaire    Feeling of Stress : Not at all  Social Connections: Socially Integrated (06/21/2022)   Social Connection and Isolation Panel [NHANES]    Frequency of Communication with Friends and Family: More than three times a week    Frequency of Social Gatherings with Friends and Family: More than three times a week    Attends Religious  Services: More than 4 times per year    Active Member of Clubs or Organizations: Yes    Attends Banker Meetings: More than 4 times per year    Marital Status: Married  Catering manager Violence: Not At Risk (06/21/2022)   Humiliation, Afraid, Rape, and Kick questionnaire    Fear of Current or Ex-Partner: No    Emotionally Abused: No    Physically Abused: No    Sexually Abused: No    Past Surgical History:  Procedure Laterality Date   COLON SURGERY  2011   Partial colon removed due to diverticulitis   COLONOSCOPY  08/2009   NEPHRECTOMY Right    PROSTATE BIOPSY     RADIOACTIVE SEED IMPLANT N/A 08/03/2017   Procedure: RADIOACTIVE SEED IMPLANT/BRACHYTHERAPY IMPLANT;  Surgeon: Ihor Gully, MD;  Location: [facility];  Service: Urology;  Laterality: N/A;   SPACE OAR INSTILLATION N/A 08/03/2017   Procedure: SPACE OAR INSTILLATION;  Surgeon: Ihor Gully, MD;  Location: [facility];  Service: Urology;  Laterality: N/A;    Family History  Problem Relation Age of Onset   Heart disease Mother        heart failure - 71 and 67 when she passed    Stroke Mother    Cancer Father        wagners   Glaucoma Father        wagoner's glaucoma   Hypertension Other         multiple family members    Allergies  Allergen Reactions   Oxycodone Other (See Comments)    Patient stated he has nightmares with oxycodone    Current Outpatient Medications on File Prior to Visit  Medication Sig Dispense Refill   allopurinol (ZYLOPRIM) 300 MG tablet TAKE 1 TABLET(300 MG) BY MOUTH DAILY 90 tablet 3   amLODipine (NORVASC) 10 MG tablet TAKE 1 TABLET(10 MG) BY MOUTH DAILY 90 tablet 3   aspirin 81 MG chewable tablet Chew by mouth.     cetirizine (ZYRTEC) 10 MG tablet Take 10 mg by mouth daily.     Cholecalciferol (VITAMIN D) 125 MCG (5000 UT) CAPS Take by mouth daily.     cyclobenzaprine (FLEXERIL) 10 MG tablet Take 1 tablet (10 mg total) by mouth 3 (three) times daily as needed for muscle spasms. 15 tablet 0   Docusate Calcium ([employer]S STOOL SOFTENER PO) Take by mouth daily.     losartan (COZAAR) 25 MG tablet TAKE 1 TABLET BY MOUTH DAILY 90 tablet 3   metFORMIN (GLUCOPHAGE) 500 MG tablet TAKE 1 TABLET BY MOUTH BEFORE BREAKFAST 90 tablet 0   Misc Natural Products (FIBER 7 PO) Take 2 tablets by mouth daily.      Multiple Vitamins-Minerals (DAILY MULTI 50+ PO) Take 1 tablet by mouth daily.     simvastatin (ZOCOR) 20 MG tablet TAKE 1 TABLET(20 MG) BY MOUTH DAILY 90 tablet 3   tamsulosin (FLOMAX) 0.4 MG CAPS capsule Take 0.4 mg by mouth daily.     No current facility-administered medications on file prior to visit.    There were no vitals taken for this visit.      Objective:   Physical Exam Vitals and nursing note reviewed.  Constitutional:      General: He is not in acute distress.    Appearance: Normal appearance. He is obese. He is not ill-appearing.  HENT:     Head: Normocephalic and atraumatic.     Right Ear: Tympanic membrane, ear canal and  external ear normal. There is no impacted cerumen.     Left Ear: Tympanic membrane, ear canal and external ear normal. There is no impacted cerumen.     Nose: Nose normal. No congestion or rhinorrhea.     Mouth/Throat:      Mouth: Mucous membranes are moist.     Pharynx: Oropharynx is clear.  Eyes:     Extraocular Movements: Extraocular movements intact.     Conjunctiva/sclera: Conjunctivae normal.     Pupils: Pupils are equal, round, and reactive to light.  Neck:     Vascular: No carotid bruit.  Cardiovascular:     Rate and Rhythm: Normal rate and regular rhythm.     Pulses: Normal pulses.     Heart sounds: No murmur heard.    No friction rub. No gallop.  Pulmonary:     Effort: Pulmonary effort is normal.     Breath sounds: Normal breath sounds.  Abdominal:     General: Abdomen is flat. Bowel sounds are normal. There is no distension.     Palpations: Abdomen is soft. There is no mass.     Tenderness: There is no abdominal tenderness. There is no guarding or rebound.     Hernia: No hernia is present.  Musculoskeletal:        General: Normal range of motion.     Cervical back: Normal range of motion and neck supple.       Back:  Lymphadenopathy:     Cervical: No cervical adenopathy.  Skin:    General: Skin is warm and dry.     Capillary Refill: Capillary refill takes less than 2 seconds.  Neurological:     General: No focal deficit present.     Mental Status: He is alert and oriented to person, place, and time.  Psychiatric:        Mood and Affect: Mood normal.        Behavior: Behavior normal.        Thought Content: Thought content normal.        Judgment: Judgment normal.       Assessment & Plan:  1. Routine general medical examination at a health care facility Today patient counseled on age appropriate routine health concerns for screening and prevention, each reviewed and up to date or declined. Immunizations reviewed and up to date or declined. Labs ordered and reviewed. Risk factors for depression reviewed and negative. Hearing function and visual acuity are intact. ADLs screened and addressed as needed. Functional ability and level of safety reviewed and appropriate. Education,  counseling and referrals performed based on assessed risks today. Patient provided with a copy of personalized plan for preventive services.   2. Diabetes mellitus treated with oral medication (HCC) - consider dose change of synthroid  - Lipid panel; Future - TSH; Future - CBC; Future - Comprehensive metabolic panel; Future - Microalbumin/Creatinine Ratio, Urine - Hemoglobin A1c; Future  3. Hyperlipidemia, unspecified hyperlipidemia type - Consider increase in statin  - Lipid panel; Future - TSH; Future - CBC; Future - Comprehensive metabolic panel; Future  4. Essential hypertension - Well controlled. No change in medication  - Lipid panel; Future - TSH; Future - CBC; Future - Comprehensive metabolic panel; Future  5. Acute gout, unspecified cause, unspecified site - Continue with allopurinol   6. Vitamin D deficiency  - VITAMIN D 25 Hydroxy (Vit-D Deficiency, Fractures); Future  7. OSA (obstructive sleep apnea) - Encourage to follow up with pulmonary  - Lipid panel; Future -  TSH; Future - CBC; Future - Comprehensive metabolic panel; Future - Ambulatory referral to Pulmonology  8. History of prostate cancer  - PSA; Future   9. Muscle strain  - Ambulatory referral to Physical Therapy   Shirline Frees, NP

## 2022-10-05 ENCOUNTER — Other Ambulatory Visit: Payer: Self-pay

## 2022-10-05 MED ORDER — ATORVASTATIN CALCIUM 20 MG PO TABS: 20.0000 mg | ORAL_TABLET | Freq: Every day | ORAL | 3 refills | Status: DC

## 2022-10-24 ENCOUNTER — Ambulatory Visit: Payer: Medicare Other | Attending: Adult Health | Admitting: Physical Therapy

## 2022-10-24 ENCOUNTER — Other Ambulatory Visit: Payer: Self-pay

## 2022-10-24 DIAGNOSIS — R293 Abnormal posture: Secondary | ICD-10-CM | POA: Diagnosis not present

## 2022-10-24 DIAGNOSIS — M6281 Muscle weakness (generalized): Secondary | ICD-10-CM | POA: Insufficient documentation

## 2022-10-24 DIAGNOSIS — T148XXA Other injury of unspecified body region, initial encounter: Secondary | ICD-10-CM | POA: Diagnosis not present

## 2022-10-24 DIAGNOSIS — X58XXXA Exposure to other specified factors, initial encounter: Secondary | ICD-10-CM | POA: Insufficient documentation

## 2022-10-24 DIAGNOSIS — R252 Cramp and spasm: Secondary | ICD-10-CM

## 2022-10-24 DIAGNOSIS — M546 Pain in thoracic spine: Secondary | ICD-10-CM | POA: Diagnosis not present

## 2022-10-24 NOTE — Therapy (Signed)
OUTPATIENT PHYSICAL THERAPY THORACIC EVALUATION   Patient Name: Jacob Stone. MRN: [id number] DOB:18-Apr-1950, 72 y.o., male Today's Date: 10/24/2022  END OF SESSION:  PT End of Session - 10/24/22 1447     Visit Number 1    Date for PT Re-Evaluation 12/19/22    Authorization Type [employer] MCR    Progress Note Due on Visit 10    PT Start Time 1447    PT Stop Time 1527    PT Time Calculation (min) 40 min    Activity Tolerance Patient tolerated treatment well    Behavior During Therapy WFL for tasks assessed/performed             Past Medical History:  Diagnosis Date   Carotid artery calcification 03/2011   MILD HARD PLAQUE LEFT BULB, 0-39% BIL. ICA STENOSIS, PATENT VERTEBRAL ARTERIES WITH ANTEGRADE FLOW   Diabetes type 2, controlled (HCC)    Diverticulitis    Fatty liver    FH: kidney cancer 2005   right kidney removed    GERD (gastroesophageal reflux disease)    Gout    History of kidney stones    Left kidney   HTN (hypertension)    Hyperlipidemia    OA (osteoarthritis)    Knee   OSA (obstructive sleep apnea) 04/18/2016   SEVERE; UNABLE TO WEAR CPAP MORE THAN 2-3 HOURS PER NIGHT   Plantar fasciitis    Prostate cancer (HCC)    Seasonal allergies    Past Surgical History:  Procedure Laterality Date   COLON SURGERY  2011   Partial colon removed due to diverticulitis   COLONOSCOPY  08/2009   NEPHRECTOMY Right    PROSTATE BIOPSY     RADIOACTIVE SEED IMPLANT N/A 08/03/2017   Procedure: RADIOACTIVE SEED IMPLANT/BRACHYTHERAPY IMPLANT;  Surgeon: Ihor Gully, MD;  Location: [facility];  Service: Urology;  Laterality: N/A;   SPACE OAR INSTILLATION N/A 08/03/2017   Procedure: SPACE OAR INSTILLATION;  Surgeon: Ihor Gully, MD;  Location: [facility];  Service: Urology;  Laterality: N/A;   Patient Active Problem List   Diagnosis Date Noted   Trigger finger, left middle finger 06/02/2021   Right tennis elbow 05/19/2021   History of  prostate cancer 09/23/2020   Low back pain 05/21/2019   Malignant neoplasm of prostate (HCC) 04/16/2017   Unilateral primary osteoarthritis, left knee 11/16/2016   OSA (obstructive sleep apnea) 04/18/2016   Sleep dysfunction with arousal disturbance 01/17/2016   Hyperlipidemia 09/14/2014   Diabetes 1.5, managed as type 2 (HCC) 07/07/2014   Acute gout 04/09/2014   Carotid artery calcification 03/02/2011   Malignant neoplasm of kidney excluding renal pelvis (HCC) 11/14/2007   ALLERGIC RHINITIS 11/14/2007   DIVERTICULOSIS, COLON 04/16/2007   Essential hypertension 04/16/2007    PCP: Shirline Frees, NP   REFERRING PROVIDER: Shirline Frees, NP   REFERRING DIAG: T14.8XXA (ICD-10-CM) - Muscle strain  THERAPY DIAG:  Pain in thoracic spine  Cramp and spasm  Rationale for Evaluation and Treatment: Rehabilitation  ONSET DATE: 1 month  SUBJECTIVE:  SUBJECTIVE STATEMENT: Mid back has started to hurt about a month ago. Low back pain is intermittent. Mid back pain. Hurts after he works out the upper back. 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 Lifting, reaching and picking up it is a jabbing pain that takes my breath away.  Hand dominance: Right  PERTINENT HISTORY:  LBP, DM, HTN, OA  PAIN:  Are you having pain? Yes: NPRS scale: 7/10 Pain location: more toward the left but mid throracic Pain description: jabbing, intermittent Aggravating factors: reaching, lifting and picking up heavier items Relieving factors: change of position, muscle relaxors  PRECAUTIONS: None  RED FLAGS: None     WEIGHT BEARING RESTRICTIONS: No  FALLS:  Has patient fallen in last 6 months? No  LIVING ENVIRONMENT: Lives with: lives with their  family Lives in: House/apartment  OCCUPATION: part time at golf course   PLOF: Independent  PATIENT GOALS: Get rid of the pain  NEXT MD VISIT: none scheduled  OBJECTIVE:   DIAGNOSTIC FINDINGS:  N/A  PATIENT SURVEYS:  FOTO 50 (goal 45)  COGNITION: Overall cognitive status: Within functional limits for tasks assessed  SENSATION: WFL  POSTURE: decreased lumbar lordosis  PALPATION: Painful along Tspine especially around T9/10 T5/6 Marked hypomobility with CPA and UPA mobs to thoracic spine     UPPER EXTREMITY ROM: WNL   UPPER EXTREMITY MMT: L Rhomboids 4/5 , R 5/5  MMT Right eval Left eval  Shoulder flexion 4+ 4+  Shoulder extension 5 5  Shoulder abduction 5 5  Shoulder adduction    Shoulder extension    Shoulder internal rotation 5 5  Shoulder external rotation 5 5  Middle trapezius 4+ 4+  Lower trapezius 3+ 4-   (Blank rows = not tested)  LUMBAR ROM: tightness with lateral flexion  Active  A/PROM  eval  Flexion WFL  Extension WNL  Right lateral flexion WFL  Left lateral flexion WFL  Right rotation WNL  Left rotation WNL   (Blank rows = not tested)     TODAY'S TREATMENT:                                                                                                                              DATE:    10/24/22 See pt ed and HEP   PATIENT EDUCATION:  Education details: PT eval findings, anticipated POC, initial HEP, and postural awareness  Person educated: Patient Education method: Explanation, Demonstration, and Handouts Education comprehension: verbalized understanding and returned demonstration  HOME EXERCISE PROGRAM: Access Code: [email or web address] URL: [email or web address] Date: 10/24/2022 Prepared by: Raynelle Fanning  Exercises - Seated Thoracic Lumbar Extension  - 1 x daily - 7 x weekly - 3 sets - 10 reps - Thoracic Extension Mobilization on Foam Roll  - 2 x daily - 7 x weekly - 2 sets - 5 reps - Sidelying Thoracic Rotation with  Open Book  -  1 x daily - 7 x weekly - 2 sets - 5 reps - Child's Pose with Thread the Needle  - 1 x daily - 7 x weekly - 1 sets - 5 reps - 10-20 sec hold - Quadruped With Rotation: Thread The Needle  - 1 x daily - 7 x weekly - 1 sets - 10 reps - Plank with Thoracic Rotation on Counter  - 1 x daily - 7 x weekly - 3 sets - 10 reps  ASSESSMENT:  CLINICAL IMPRESSION: Patient is a 72 y.o. male who was seen today for physical therapy evaluation and treatment for thoracic spine pain.   OBJECTIVE IMPAIRMENTS: decreased activity tolerance, decreased ROM, decreased strength, hypomobility, increased muscle spasms, impaired flexibility, postural dysfunction, and pain.   ACTIVITY LIMITATIONS: carrying, lifting, and reaching out and up  PARTICIPATION LIMITATIONS: community activity and occupation  PERSONAL FACTORS: Age and 3+ comorbidities: chronic LBP, DM, OA  are also affecting patient's functional outcome.   REHAB POTENTIAL: Excellent  CLINICAL DECISION MAKING: Stable/uncomplicated  EVALUATION COMPLEXITY: Low   GOALS: Goals reviewed with patient? Yes  SHORT TERM GOALS: Target date: 11/21/22  Ind with initial HEP Baseline:  Goal status: INITIAL  2.  Decreased thoracic spine pain with activity by 40% Baseline:  Goal status: INITIAL   LONG TERM GOALS: Target date: 12/19/22  Ind with advanced HEP Baseline:  Goal status: INITIAL  2.  Decreased pain in thoracic spine with working out and ADLS by 80% or more to improve QOL. Baseline:  Goal status: INITIAL  3.  Improved thoracic spine mobility to WNL without pain provocation during spinal mobilizations. Baseline:  Goal status: INITIAL  4.  Improved mid/low trap and rhomboid strength to 4+/5. Baseline:  Goal status: INITIAL  5.  Improved FOTO to 63 showing functional improvement Baseline:  Goal status: INITIAL  6.  Able to work out without increased thoracic spine pain Baseline:  Goal status: INITIAL   PLAN:  PT  FREQUENCY: 1-2x/week  PT DURATION: 8 weeks  PLANNED INTERVENTIONS: Therapeutic exercises, Therapeutic activity, Neuromuscular re-education, Patient/Family education, Self Care, Joint mobilization, Dry Needling, Spinal mobilization, Cryotherapy, Moist heat, Taping, Traction, Ionotophoresis 4mg /ml Dexamethasone, and Manual therapy  PLAN FOR NEXT SESSION: Review HEP, DN/MT and mobs to thoracic spine, focus on thoracic mobility, mid/low trap and rhomboid strength, chest opening.    Solon Palm, PT  10/24/2022, 5:39 PM

## 2022-10-25 ENCOUNTER — Ambulatory Visit: Payer: Medicare Other

## 2022-10-25 DIAGNOSIS — R293 Abnormal posture: Secondary | ICD-10-CM | POA: Diagnosis not present

## 2022-10-25 DIAGNOSIS — R252 Cramp and spasm: Secondary | ICD-10-CM

## 2022-10-25 DIAGNOSIS — M6281 Muscle weakness (generalized): Secondary | ICD-10-CM

## 2022-10-25 DIAGNOSIS — M546 Pain in thoracic spine: Secondary | ICD-10-CM

## 2022-10-25 DIAGNOSIS — T148XXA Other injury of unspecified body region, initial encounter: Secondary | ICD-10-CM | POA: Diagnosis not present

## 2022-10-25 NOTE — Therapy (Signed)
OUTPATIENT PHYSICAL THERAPY THORACIC EVALUATION   Patient Name: Jacob Stone. MRN: [id number] DOB:Sep 05, 1950, 72 y.o., male Today's Date: 10/25/2022  END OF SESSION:  PT End of Session - 10/25/22 1529     Visit Number 2    Date for PT Re-Evaluation 12/19/22    Authorization Type UHC MCR    Progress Note Due on Visit 10    PT Start Time 1530    PT Stop Time 1612    PT Time Calculation (min) 42 min    Activity Tolerance Patient tolerated treatment well    Behavior During Therapy WFL for tasks assessed/performed             Past Medical History:  Diagnosis Date   Carotid artery calcification 03/2011   MILD HARD PLAQUE LEFT BULB, 0-39% BIL. ICA STENOSIS, PATENT VERTEBRAL ARTERIES WITH ANTEGRADE FLOW   Diabetes type 2, controlled (HCC)    Diverticulitis    Fatty liver    FH: kidney cancer 2005   right kidney removed    GERD (gastroesophageal reflux disease)    Gout    History of kidney stones    Left kidney   HTN (hypertension)    Hyperlipidemia    OA (osteoarthritis)    Knee   OSA (obstructive sleep apnea) 04/18/2016   SEVERE; UNABLE TO WEAR CPAP MORE THAN 2-3 HOURS PER NIGHT   Plantar fasciitis    Prostate cancer (HCC)    Seasonal allergies    Past Surgical History:  Procedure Laterality Date   COLON SURGERY  2011   Partial colon removed due to diverticulitis   COLONOSCOPY  08/2009   NEPHRECTOMY Right    PROSTATE BIOPSY     RADIOACTIVE SEED IMPLANT N/A 08/03/2017   Procedure: RADIOACTIVE SEED IMPLANT/BRACHYTHERAPY IMPLANT;  Surgeon: Ihor Gully, MD;  Location: [facility];  Service: Urology;  Laterality: N/A;   SPACE OAR INSTILLATION N/A 08/03/2017   Procedure: SPACE OAR INSTILLATION;  Surgeon: Ihor Gully, MD;  Location: [facility];  Service: Urology;  Laterality: N/A;   Patient Active Problem List   Diagnosis Date Noted   Trigger finger, left middle finger 06/02/2021   Right tennis elbow 05/19/2021   History of  prostate cancer 09/23/2020   Low back pain 05/21/2019   Malignant neoplasm of prostate (HCC) 04/16/2017   Unilateral primary osteoarthritis, left knee 11/16/2016   OSA (obstructive sleep apnea) 04/18/2016   Sleep dysfunction with arousal disturbance 01/17/2016   Hyperlipidemia 09/14/2014   Diabetes 1.5, managed as type 2 (HCC) 07/07/2014   Acute gout 04/09/2014   Carotid artery calcification 03/02/2011   Malignant neoplasm of kidney excluding renal pelvis (HCC) 11/14/2007   ALLERGIC RHINITIS 11/14/2007   DIVERTICULOSIS, COLON 04/16/2007   Essential hypertension 04/16/2007    PCP: Shirline Frees, NP   REFERRING PROVIDER: Shirline Frees, NP   REFERRING DIAG: T14.8XXA (ICD-10-CM) - Muscle strain  THERAPY DIAG:  Pain in thoracic spine  Cramp and spasm  Muscle weakness (generalized)  Abnormal posture  Rationale for Evaluation and Treatment: Rehabilitation  ONSET DATE: 1 month  SUBJECTIVE:  SUBJECTIVE STATEMENT: No problems today.  Played pickle ball this morning and that felt like it stretched me out.  Rates this pain at 2/10.    Hand dominance: Right  PERTINENT HISTORY:  LBP, DM, HTN, OA  PAIN:  Are you having pain? Yes: NPRS scale: 7/10 Pain location: more toward the left but mid throracic Pain description: jabbing, intermittent Aggravating factors: reaching, lifting and picking up heavier items Relieving factors: change of position, muscle relaxors  PRECAUTIONS: None  RED FLAGS: None     WEIGHT BEARING RESTRICTIONS: No  FALLS:  Has patient fallen in last 6 months? 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[employer]   PLOF: Independent  PATIENT GOALS: Get rid of the pain  NEXT MD  VISIT: none scheduled  OBJECTIVE:   DIAGNOSTIC FINDINGS:  N/A  PATIENT SURVEYS:  FOTO 50 (goal 66)  COGNITION: Overall cognitive status: Within functional limits for tasks assessed  SENSATION: WFL  POSTURE: decreased lumbar lordosis  PALPATION: Painful along Tspine especially around T9/10 T5/6 Marked hypomobility with CPA and UPA mobs to thoracic spine     UPPER EXTREMITY ROM: WNL   UPPER EXTREMITY MMT: L Rhomboids 4/5 , R 5/5  MMT Right eval Left eval  Shoulder flexion 4+ 4+  Shoulder extension 5 5  Shoulder abduction 5 5  Shoulder adduction    Shoulder extension    Shoulder internal rotation 5 5  Shoulder external rotation 5 5  Middle trapezius 4+ 4+  Lower trapezius 3+ 4-   (Blank rows = not tested)  LUMBAR ROM: tightness with lateral flexion  Active  A/PROM  eval  Flexion WFL  Extension WNL  Right lateral flexion WFL  Left lateral flexion WFL  Right rotation WNL  Left rotation WNL   (Blank rows = not tested)     TODAY'S TREATMENT:                                                                                                                              DATE:   10/25/22 UBE x 6 min 3/3 3 way scapular stabilization with blue loop x 5 each side 4 D ball rolls x 20 each direction with light blue plyo ball Seated rows on matrix with 40 lbs x 20 WITTY's with 2 lbs x 10 each over box (tall side up) Trigger Point Dry-Needling  Treatment instructions: Expect mild to moderate muscle soreness. S/S of pneumothorax if dry needled over a lung field, and to seek immediate medical attention should they occur. Patient verbalized understanding of these instructions and education. Patient Consent Given: Yes Education handout provided: Yes Muscles treated: bilateral upper traps left rhomboids Electrical stimulation performed: No Parameters: N/A Treatment response/outcome: Skilled palpation used to identify taut bands and trigger points.  Once identified, dry  needling techniques used to treat these areas.  Twitch response ellicited along with palpable elongation of muscle.  Following treatment, patient reported mild soreness but relief of muscle tension  10/24/22 See pt ed and HEP   PATIENT EDUCATION:  Education details: PT eval findings, anticipated POC, initial HEP, and postural awareness  Person educated: Patient Education method: Explanation, Demonstration, and Handouts Education comprehension: verbalized understanding and returned demonstration  HOME EXERCISE PROGRAM: Access Code: [email or web address] URL: [email or web address] Date: 10/24/2022 Prepared by: Raynelle Fanning  Exercises - Seated Thoracic Lumbar Extension  - 1 x daily - 7 x weekly - 3 sets - 10 reps - Thoracic Extension Mobilization on Foam Roll  - 2 x daily - 7 x weekly - 2 sets - 5 reps - Sidelying Thoracic Rotation with Open Book  - 1 x daily - 7 x weekly - 2 sets - 5 reps - Child's Pose with Thread the Needle  - 1 x daily - 7 x weekly - 1 sets - 5 reps - 10-20 sec hold - Quadruped With Rotation: Thread The Needle  - 1 x daily - 7 x weekly - 1 sets - 10 reps - Plank with Thoracic Rotation on Counter  - 1 x daily - 7 x weekly - 3 sets - 10 reps  ASSESSMENT:  CLINICAL IMPRESSION: Bill fatigued easily with WITTY but was able to do all other tasks with ease.  He had good twitch response in all muscle treated with dry needling and palpable elongation of tissue was noted.  He should continue to do well and would benefit from continuing skilled PT for postural and shoulder strengthening along with dry needling and thoracic mobility.    OBJECTIVE IMPAIRMENTS: decreased activity tolerance, decreased ROM, decreased strength, hypomobility, increased muscle spasms, impaired flexibility, postural dysfunction, and pain.   ACTIVITY LIMITATIONS: carrying, lifting, and reaching out and up  PARTICIPATION LIMITATIONS: community activity and occupation  PERSONAL FACTORS: Age and 3+  comorbidities: chronic LBP, DM, OA  are also affecting patient's functional outcome.   REHAB POTENTIAL: Excellent  CLINICAL DECISION MAKING: Stable/uncomplicated  EVALUATION COMPLEXITY: Low   GOALS: Goals reviewed with patient? Yes  SHORT TERM GOALS: Target date: 11/21/22  Ind with initial HEP Baseline:  Goal status: INITIAL  2.  Decreased thoracic spine pain with activity by 40% Baseline:  Goal status: INITIAL   LONG TERM GOALS: Target date: 12/19/22  Ind with advanced HEP Baseline:  Goal status: INITIAL  2.  Decreased pain in thoracic spine with working out and ADLS by 80% or more to improve QOL. Baseline:  Goal status: INITIAL  3.  Improved thoracic spine mobility to WNL without pain provocation during spinal mobilizations. Baseline:  Goal status: INITIAL  4.  Improved mid/low trap and rhomboid strength to 4+/5. Baseline:  Goal status: INITIAL  5.  Improved FOTO to 63 showing functional improvement Baseline:  Goal status: INITIAL  6.  Able to work out without increased thoracic spine pain Baseline:  Goal status: INITIAL   PLAN:  PT FREQUENCY: 1-2x/week  PT DURATION: 8 weeks  PLANNED INTERVENTIONS: Therapeutic exercises, Therapeutic activity, Neuromuscular re-education, Patient/Family education, Self Care, Joint mobilization, Dry Needling, Spinal mobilization, Cryotherapy, Moist heat, Taping, Traction, Ionotophoresis 4mg /ml Dexamethasone, and Manual therapy  PLAN FOR NEXT SESSION: Progress HEP, assess response to DN, focus on thoracic mobility, mid/low trap and rhomboid strength, chest opening.    Victorino Dike B. Mehar Kirkwood, PT 10/25/22 4:13 PM [facility] [address] [place], [place] [address] Phone # [phone] Fax [phone]

## 2022-11-01 ENCOUNTER — Ambulatory Visit: Payer: Medicare Other

## 2022-11-01 DIAGNOSIS — M546 Pain in thoracic spine: Secondary | ICD-10-CM | POA: Diagnosis not present

## 2022-11-01 DIAGNOSIS — T148XXA Other injury of unspecified body region, initial encounter: Secondary | ICD-10-CM | POA: Diagnosis not present

## 2022-11-01 DIAGNOSIS — M6281 Muscle weakness (generalized): Secondary | ICD-10-CM

## 2022-11-01 DIAGNOSIS — R293 Abnormal posture: Secondary | ICD-10-CM

## 2022-11-01 DIAGNOSIS — R252 Cramp and spasm: Secondary | ICD-10-CM

## 2022-11-01 NOTE — Therapy (Signed)
OUTPATIENT PHYSICAL THERAPY THORACIC EVALUATION   Patient Name: Jacob Stone. MRN: [id number] DOB:28-Jul-1950, 72 y.o., male Today's Date: 11/01/2022  END OF SESSION:  PT End of Session - 11/01/22 1617     Visit Number 3    Date for PT Re-Evaluation 12/19/22    Authorization Type U[employer] MCR    PT Start Time 1610    PT Stop Time 1653    PT Time Calculation (min) 43 min    Activity Tolerance Patient tolerated treatment well    Behavior During Therapy WFL for tasks assessed/performed             Past Medical History:  Diagnosis Date   Carotid artery calcification 03/2011   MILD HARD PLAQUE LEFT BULB, 0-39% BIL. ICA STENOSIS, PATENT VERTEBRAL ARTERIES WITH ANTEGRADE FLOW   Diabetes type 2, controlled (HCC)    Diverticulitis    Fatty liver    FH: kidney cancer 2005   right kidney removed    GERD (gastroesophageal reflux disease)    Gout    History of kidney stones    Left kidney   HTN (hypertension)    Hyperlipidemia    OA (osteoarthritis)    Knee   OSA (obstructive sleep apnea) 04/18/2016   SEVERE; UNABLE TO WEAR CPAP MORE THAN 2-3 HOURS PER NIGHT   Plantar fasciitis    Prostate cancer (HCC)    Seasonal allergies    Past Surgical History:  Procedure Laterality Date   COLON SURGERY  2011   Partial colon removed due to diverticulitis   COLONOSCOPY  08/2009   NEPHRECTOMY Right    PROSTATE BIOPSY     RADIOACTIVE SEED IMPLANT N/A 08/03/2017   Procedure: RADIOACTIVE SEED IMPLANT/BRACHYTHERAPY IMPLANT;  Surgeon: Ihor Gully, MD;  Location: [facility] [place];  Service: Urology;  Laterality: N/A;   SPACE OAR INSTILLATION N/A 08/03/2017   Procedure: SPACE OAR INSTILLATION;  Surgeon: Ihor Gully, MD;  Location[facility];  Service: Urology;  Laterality: N/A;   Patient Active Problem List   Diagnosis Date Noted   Trigger finger, left middle finger 06/02/2021   Right tennis elbow 05/19/2021   History of prostate cancer 09/23/2020   Low back  pain 05/21/2019   Malignant neoplasm of prostate (HCC) 04/16/2017   Unilateral primary osteoarthritis, left knee 11/16/2016   OSA (obstructive sleep apnea) 04/18/2016   Sleep dysfunction with arousal disturbance 01/17/2016   Hyperlipidemia 09/14/2014   Diabetes 1.5, managed as type 2 (HCC) 07/07/2014   Acute gout 04/09/2014   Carotid artery calcification 03/02/2011   Malignant neoplasm of kidney excluding renal pelvis (HCC) 11/14/2007   ALLERGIC RHINITIS 11/14/2007   DIVERTICULOSIS, COLON 04/16/2007   Essential hypertension 04/16/2007    PCP: Shirline Frees, NP   REFERRING PROVIDER: Shirline Frees, NP   REFERRING DIAG: T14.8XXA (ICD-10-CM) - Muscle strain  THERAPY DIAG:  Pain in thoracic spine  Cramp and spasm  Muscle weakness (generalized)  Abnormal posture  Rationale for Evaluation and Treatment: Rehabilitation  ONSET DATE: 1 month  SUBJECTIVE:  SUBJECTIVE STATEMENT: Patient reports no pain today.  Played pickle ball this morning and did well.  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      Hand dominance: Right  PERTINENT HISTORY:  LBP, DM, HTN, OA  PAIN:  Are you having pain? Yes: NPRS scale: 7/10 Pain location: more toward the left but mid throracic Pain description: jabbing, intermittent Aggravating factors: reaching, lifting and picking up heavier items Relieving factors: change of position, muscle relaxors  PRECAUTIONS: None  RED FLAGS: None     WEIGHT BEARING RESTRICTIONS: No  FALLS:  Has patient fallen in last 6 months? 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   PLOF: Independent  PATIENT GOALS: Get rid of the pain  NEXT MD VISIT: none  scheduled  OBJECTIVE:   DIAGNOSTIC FINDINGS:  N/A  PATIENT SURVEYS:  FOTO 50 (goal 35)  COGNITION: Overall cognitive status: Within functional limits for tasks assessed  SENSATION: WFL  POSTURE: decreased lumbar lordosis  PALPATION: Painful along Tspine especially around T9/10 T5/6 Marked hypomobility with CPA and UPA mobs to thoracic spine     UPPER EXTREMITY ROM: WNL   UPPER EXTREMITY MMT: L Rhomboids 4/5 , R 5/5  MMT Right eval Left eval  Shoulder flexion 4+ 4+  Shoulder extension 5 5  Shoulder abduction 5 5  Shoulder adduction    Shoulder extension    Shoulder internal rotation 5 5  Shoulder external rotation 5 5  Middle trapezius 4+ 4+  Lower trapezius 3+ 4-   (Blank rows = not tested)  LUMBAR ROM: tightness with lateral flexion  Active  A/PROM  eval  Flexion WFL  Extension WNL  Right lateral flexion WFL  Left lateral flexion WFL  Right rotation WNL  Left rotation WNL   (Blank rows = not tested)     TODAY'S TREATMENT:                                                                                                                              DATE:   11/01/22 UBE x 6 min 3/3 3 way scapular stabilization with blue loop x 5 each side 4 D ball rolls x 20 each direction with yellow plyo ball Lat pull down (seated) 70 lbs x 20 Seated rows on matrix with 45 lbs x 20 WITTY's with 2 lbs x 10 each over box (tall side up) Trigger Point Dry-Needling  Treatment instructions: Expect mild to moderate muscle soreness. S/S of pneumothorax if dry needled over a lung field, and to seek immediate medical attention should they occur. Patient verbalized understanding of these instructions and education. Patient Consent Given: Yes Education handout provided: Yes Muscles treated: bilateral upper traps and left rhomboids Electrical stimulation performed: No Parameters: N/A Treatment response/outcome: Skilled palpation used to identify taut bands and trigger points.   Once identified, dry needling techniques used to  treat these areas.  Twitch response ellicited along with palpable elongation of muscle.  Following treatment, patient reported mild soreness but relief of muscle tension  DATE:   10/25/22 UBE x 6 min 3/3 3 way scapular stabilization with blue loop x 5 each side 4 D ball rolls x 20 each direction with light blue plyo ball Seated rows on matrix with 40 lbs x 20 WITTY's with 2 lbs x 10 each over box (tall side up) Trigger Point Dry-Needling  Treatment instructions: Expect mild to moderate muscle soreness. S/S of pneumothorax if dry needled over a lung field, and to seek immediate medical attention should they occur. Patient verbalized understanding of these instructions and education. Patient Consent Given: Yes Education handout provided: Yes Muscles treated: bilateral upper traps left rhomboids Electrical stimulation performed: No Parameters: N/A Treatment response/outcome: Skilled palpation used to identify taut bands and trigger points.  Once identified, dry needling techniques used to treat these areas.  Twitch response ellicited along with palpable elongation of muscle.  Following treatment, patient reported mild soreness but relief of muscle tension  10/24/22 See pt ed and HEP   PATIENT EDUCATION:  Education details: PT eval findings, anticipated POC, initial HEP, and postural awareness  Person educated: Patient Education method: Explanation, Demonstration, and Handouts Education comprehension: verbalized understanding and returned demonstration  HOME EXERCISE PROGRAM: Access Code: [email or web address] URL: [email or web address] Date: 10/24/2022 Prepared by: Raynelle Fanning  Exercises - Seated Thoracic Lumbar Extension  - 1 x daily - 7 x weekly - 3 sets - 10 reps - Thoracic Extension Mobilization on Foam Roll  - 2 x daily - 7 x weekly - 2 sets - 5 reps - Sidelying Thoracic Rotation with Open Book  - 1 x daily - 7 x weekly - 2 sets - 5  reps - Child's Pose with Thread the Needle  - 1 x daily - 7 x weekly - 1 sets - 5 reps - 10-20 sec hold - Quadruped With Rotation: Thread The Needle  - 1 x daily - 7 x weekly - 1 sets - 10 reps - Plank with Thoracic Rotation on Counter  - 1 x daily - 7 x weekly - 3 sets - 10 reps  ASSESSMENT:  CLINICAL IMPRESSION: Annette Stable continues to fatigue with WITTY but was able to do all other tasks with increased resistance.  He had good twitch response in bilateral upper traps  and elongation of tissue was noted.  He reported no pain today.  He should continue to do well and would benefit from continuing skilled PT for postural and shoulder strengthening along with dry needling and thoracic mobility.    OBJECTIVE IMPAIRMENTS: decreased activity tolerance, decreased ROM, decreased strength, hypomobility, increased muscle spasms, impaired flexibility, postural dysfunction, and pain.   ACTIVITY LIMITATIONS: carrying, lifting, and reaching out and up  PARTICIPATION LIMITATIONS: community activity and occupation  PERSONAL FACTORS: Age and 3+ comorbidities: chronic LBP, DM, OA  are also affecting patient's functional outcome.   REHAB POTENTIAL: Excellent  CLINICAL DECISION MAKING: Stable/uncomplicated  EVALUATION COMPLEXITY: Low   GOALS: Goals reviewed with patient? Yes  SHORT TERM GOALS: Target date: 11/21/22  Ind with initial HEP Baseline:  Goal status: INITIAL  2.  Decreased thoracic spine pain with activity by 40% Baseline:  Goal status: INITIAL   LONG TERM GOALS: Target date: 12/19/22  Ind with advanced HEP Baseline:  Goal status: INITIAL  2.  Decreased pain in thoracic spine with working out and ADLS by 80% or more to improve QOL. Baseline:  Goal status: INITIAL  3.  Improved thoracic spine mobility to WNL without pain provocation during spinal mobilizations. Baseline:  Goal status: INITIAL  4.  Improved mid/low trap and rhomboid strength to 4+/5. Baseline:  Goal status:  INITIAL  5.  Improved FOTO to 63 showing functional improvement Baseline:  Goal status: INITIAL  6.  Able to work out without increased thoracic spine pain Baseline:  Goal status: INITIAL   PLAN:  PT FREQUENCY: 1-2x/week  PT DURATION: 8 weeks  PLANNED INTERVENTIONS: Therapeutic exercises, Therapeutic activity, Neuromuscular re-education, Patient/Family education, Self Care, Joint mobilization, Dry Needling, Spinal mobilization, Cryotherapy, Moist heat, Taping, Traction, Ionotophoresis 4mg /ml Dexamethasone, and Manual therapy  PLAN FOR NEXT SESSION: Progress HEP, assess response to DN,  thoracic mobility, mid/low trap and rhomboid strength, chest opening.    Victorino Dike B. Viggo Perko, PT 11/01/22 4:54 PM [facility] [address] [place], [place] [address] Phone # [phone] Fax [phone]

## 2022-11-06 ENCOUNTER — Encounter: Payer: Self-pay | Admitting: Physical Therapy

## 2022-11-06 ENCOUNTER — Ambulatory Visit: Payer: Medicare Other | Admitting: Physical Therapy

## 2022-11-06 DIAGNOSIS — T148XXA Other injury of unspecified body region, initial encounter: Secondary | ICD-10-CM | POA: Diagnosis not present

## 2022-11-06 DIAGNOSIS — R252 Cramp and spasm: Secondary | ICD-10-CM

## 2022-11-06 DIAGNOSIS — R293 Abnormal posture: Secondary | ICD-10-CM

## 2022-11-06 DIAGNOSIS — M546 Pain in thoracic spine: Secondary | ICD-10-CM | POA: Diagnosis not present

## 2022-11-06 DIAGNOSIS — M6281 Muscle weakness (generalized): Secondary | ICD-10-CM | POA: Diagnosis not present

## 2022-11-06 NOTE — Therapy (Signed)
OUTPATIENT PHYSICAL THERAPY THORACIC EVALUATION   Patient Name: Jacob Stone. MRN: [id number] DOB:1950-02-20, 72 y.o., male Today's Date: 11/06/2022  END OF SESSION:  PT End of Session - 11/06/22 1613     Visit Number 4    Date for PT Re-Evaluation 12/19/22    Authorization Type U[employer] MCR    Progress Note Due on Visit 10    PT Start Time 1531    PT Stop Time 1610    PT Time Calculation (min) 39 min    Activity Tolerance Patient tolerated treatment well    Behavior During Therapy WFL for tasks assessed/performed              Past Medical History:  Diagnosis Date   Carotid artery calcification 03/2011   MILD HARD PLAQUE LEFT BULB, 0-39% BIL. ICA STENOSIS, PATENT VERTEBRAL ARTERIES WITH ANTEGRADE FLOW   Diabetes type 2, controlled (HCC)    Diverticulitis    Fatty liver    FH: kidney cancer 2005   right kidney removed    GERD (gastroesophageal reflux disease)    Gout    History of kidney stones    Left kidney   HTN (hypertension)    Hyperlipidemia    OA (osteoarthritis)    Knee   OSA (obstructive sleep apnea) 04/18/2016   SEVERE; UNABLE TO WEAR CPAP MORE THAN 2-3 HOURS PER NIGHT   Plantar fasciitis    Prostate cancer (HCC)    Seasonal allergies    Past Surgical History:  Procedure Laterality Date   COLON SURGERY  2011   Partial colon removed due to diverticulitis   COLONOSCOPY  08/2009   NEPHRECTOMY Right    PROSTATE BIOPSY     RADIOACTIVE SEED IMPLANT N/A 08/03/2017   Procedure: RADIOACTIVE SEED IMPLANT/BRACHYTHERAPY IMPLANT;  Surgeon: Ihor Gully, MD;  Location: [facility];  Service: Urology;  Laterality: N/A;   SPACE OAR INSTILLATION N/A 08/03/2017   Procedure: SPACE OAR INSTILLATION;  Surgeon: Ihor Gully, MD;  Location: [facility];  Service: Urology;  Laterality: N/A;   Patient Active Problem List   Diagnosis Date Noted   Trigger finger, left middle finger 06/02/2021   Right tennis elbow 05/19/2021   History of  prostate cancer 09/23/2020   Low back pain 05/21/2019   Malignant neoplasm of prostate (HCC) 04/16/2017   Unilateral primary osteoarthritis, left knee 11/16/2016   OSA (obstructive sleep apnea) 04/18/2016   Sleep dysfunction with arousal disturbance 01/17/2016   Hyperlipidemia 09/14/2014   Diabetes 1.5, managed as type 2 (HCC) 07/07/2014   Acute gout 04/09/2014   Carotid artery calcification 03/02/2011   Malignant neoplasm of kidney excluding renal pelvis (HCC) 11/14/2007   ALLERGIC RHINITIS 11/14/2007   DIVERTICULOSIS, COLON 04/16/2007   Essential hypertension 04/16/2007    PCP: Shirline Frees, NP   REFERRING PROVIDER: Shirline Frees, NP   REFERRING DIAG: T14.8XXA (ICD-10-CM) - Muscle strain  THERAPY DIAG:  Pain in thoracic spine  Cramp and spasm  Muscle weakness (generalized)  Abnormal posture  Rationale for Evaluation and Treatment: Rehabilitation  ONSET DATE: 1 month  SUBJECTIVE:  SUBJECTIVE STATEMENT: Patient reports he is doing well today. He is having 2/10 pain in his upper back. He felt good after DN last treatment session.      Hand dominance: Right  PERTINENT HISTORY:  LBP, DM, HTN, OA  PAIN:  Are you having pain? Yes: NPRS scale: 7/10 Pain location: more toward the left but mid throracic Pain description: jabbing, intermittent Aggravating factors: reaching, lifting and picking up heavier items Relieving factors: change of position, muscle relaxors  PRECAUTIONS: None  RED FLAGS: None     WEIGHT BEARING RESTRICTIONS: No  FALLS:  Has patient Stone in last 6 months? 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   PLOF: Independent  PATIENT GOALS: Get rid of the  pain  NEXT MD VISIT: none scheduled  OBJECTIVE:   DIAGNOSTIC FINDINGS:  N/A  PATIENT SURVEYS:  FOTO 50 (goal 36)  COGNITION: Overall cognitive status: Within functional limits for tasks assessed  SENSATION: WFL  POSTURE: decreased lumbar lordosis  PALPATION: Painful along Tspine especially around T9/10 T5/6 Marked hypomobility with CPA and UPA mobs to thoracic spine     UPPER EXTREMITY ROM: WNL   UPPER EXTREMITY MMT: L Rhomboids 4/5 , R 5/5  MMT Right eval Left eval  Shoulder flexion 4+ 4+  Shoulder extension 5 5  Shoulder abduction 5 5  Shoulder adduction    Shoulder extension    Shoulder internal rotation 5 5  Shoulder external rotation 5 5  Middle trapezius 4+ 4+  Lower trapezius 3+ 4-   (Blank rows = not tested)  LUMBAR ROM: tightness with lateral flexion  Active  A/PROM  eval  Flexion WFL  Extension WNL  Right lateral flexion WFL  Left lateral flexion WFL  Right rotation WNL  Left rotation WNL   (Blank rows = not tested)     TODAY'S TREATMENT:                                                                                                                              DATE:   11/06/22 UBE x 6 min 3/3 Seated thoracic extension x10 3 way scapular stabilization with blue loop x 5 each side 4 D ball rolls x 20 each direction with yellow plyo ball Wall Push Ups 2x8 Lat pull down (seated) 70 lbs x 20 Seated rows on matrix with 45 lbs x 20 WITTY's with 2 lbs x 10 each seated with trunk flexion & standing Wall Push Ups Farmer's Carries 10lb DBs x3 3 way stability ball stretch Updated HEP  11/01/22 UBE x 6 min 3/3 3 way scapular stabilization with blue loop x 5 each side 4 D ball rolls x 20 each direction with yellow plyo ball Lat pull down (seated) 70 lbs x 20 Seated rows on matrix with 45 lbs x 20 WITTY's with 2 lbs x 10 each over box (tall side up) Trigger Point  Dry-Needling  Treatment instructions: Expect mild to moderate muscle soreness.  S/S of pneumothorax if dry needled over a lung field, and to seek immediate medical attention should they occur. Patient verbalized understanding of these instructions and education. Patient Consent Given: Yes Education handout provided: Yes Muscles treated: bilateral upper traps and left rhomboids Electrical stimulation performed: No Parameters: N/A Treatment response/outcome: Skilled palpation used to identify taut bands and trigger points.  Once identified, dry needling techniques used to treat these areas.  Twitch response ellicited along with palpable elongation of muscle.  Following treatment, patient reported mild soreness but relief of muscle tension    10/25/22 UBE x 6 min 3/3 3 way scapular stabilization with blue loop x 5 each side 4 D ball rolls x 20 each direction with light blue plyo ball Seated rows on matrix with 40 lbs x 20 WITTY's with 2 lbs x 10 each over box (tall side up) Trigger Point Dry-Needling  Treatment instructions: Expect mild to moderate muscle soreness. S/S of pneumothorax if dry needled over a lung field, and to seek immediate medical attention should they occur. Patient verbalized understanding of these instructions and education. Patient Consent Given: Yes Education handout provided: Yes Muscles treated: bilateral upper traps left rhomboids Electrical stimulation performed: No Parameters: N/A Treatment response/outcome: Skilled palpation used to identify taut bands and trigger points.  Once identified, dry needling techniques used to treat these areas.  Twitch response ellicited along with palpable elongation of muscle.  Following treatment, patient reported mild soreness but relief of muscle tension    PATIENT EDUCATION:  Education details: PT eval findings, anticipated POC, initial HEP, and postural awareness  Person educated: Patient Education method: Explanation, Demonstration, and Handouts Education comprehension: verbalized understanding and  returned demonstration  HOME EXERCISE PROGRAM: Access Code: [email or web address] URL: [email or web address] Date: 11/06/2022 Prepared by: Claude Manges  Exercises - Seated Thoracic Lumbar Extension  - 1 x daily - 7 x weekly - 3 sets - 10 reps - Thoracic Extension Mobilization on Foam Roll  - 2 x daily - 7 x weekly - 2 sets - 5 reps - Sidelying Thoracic Rotation with Open Book  - 1 x daily - 7 x weekly - 2 sets - 5 reps - Child's Pose with Thread the Needle  - 1 x daily - 7 x weekly - 1 sets - 5 reps - 10-20 sec hold - Quadruped With Rotation: Thread The Needle  - 1 x daily - 7 x weekly - 1 sets - 10 reps - Prone Scapular Retraction Y  - 1 x daily - 7 x weekly - 1 sets - 10 reps - Seated Shoulder Y's  - 1 x daily - 7 x weekly - 1 sets - 10 reps - Seated Shoulder Abduction with Dumbbells - Thumbs Up  - 1 x daily - 7 x weekly - 1 sets - 10 reps  ASSESSMENT:  CLINICAL IMPRESSION: Today's treatment session focused on thoracic mobility and periscapular strengthening. Patient tolerated treatment session well and did not verbalize any increase in pain. Incorporated wall push ups and farmers carries into today's treatment session and patient performed well and required minimal verbal cues. Updated patient's HEP to include postural strengthening exercises. Patient will benefit from skilled PT to address the below impairments and improve overall function.   OBJECTIVE IMPAIRMENTS: decreased activity tolerance, decreased ROM, decreased strength, hypomobility, increased muscle spasms, impaired flexibility, postural dysfunction, and pain.   ACTIVITY LIMITATIONS: carrying, lifting, and reaching out and up  PARTICIPATION LIMITATIONS: community activity  and occupation  PERSONAL FACTORS: Age and 3+ comorbidities: chronic LBP, DM, OA  are also affecting patient's functional outcome.   REHAB POTENTIAL: Excellent  CLINICAL DECISION MAKING: Stable/uncomplicated  EVALUATION COMPLEXITY:  Low   GOALS: Goals reviewed with patient? Yes  SHORT TERM GOALS: Target date: 11/21/22  Ind with initial HEP Baseline:  Goal status: INITIAL  2.  Decreased thoracic spine pain with activity by 40% Baseline:  Goal status: INITIAL   LONG TERM GOALS: Target date: 12/19/22  Ind with advanced HEP Baseline:  Goal status: INITIAL  2.  Decreased pain in thoracic spine with working out and ADLS by 80% or more to improve QOL. Baseline:  Goal status: INITIAL  3.  Improved thoracic spine mobility to WNL without pain provocation during spinal mobilizations. Baseline:  Goal status: INITIAL  4.  Improved mid/low trap and rhomboid strength to 4+/5. Baseline:  Goal status: INITIAL  5.  Improved FOTO to 63 showing functional improvement Baseline:  Goal status: INITIAL  6.  Able to work out without increased thoracic spine pain Baseline:  Goal status: INITIAL   PLAN:  PT FREQUENCY: 1-2x/week  PT DURATION: 8 weeks  PLANNED INTERVENTIONS: Therapeutic exercises, Therapeutic activity, Neuromuscular re-education, Patient/Family education, Self Care, Joint mobilization, Dry Needling, Spinal mobilization, Cryotherapy, Moist heat, Taping, Traction, Ionotophoresis 4mg /ml Dexamethasone, and Manual therapy  PLAN FOR NEXT SESSION: continue postural strengthening ; increase weight with Lat pull down & rows ; DN as needed   Claude Manges, PT 11/06/22 4:14 PM [facility] [address] [place], [place] [address] Phone # [phone] Fax [phone]

## 2022-11-07 ENCOUNTER — Ambulatory Visit (INDEPENDENT_AMBULATORY_CARE_PROVIDER_SITE_OTHER): Payer: Medicare Other

## 2022-11-07 DIAGNOSIS — Z23 Encounter for immunization: Secondary | ICD-10-CM

## 2022-11-12 ENCOUNTER — Other Ambulatory Visit: Payer: Self-pay | Admitting: Adult Health

## 2022-11-12 DIAGNOSIS — E139 Other specified diabetes mellitus without complications: Secondary | ICD-10-CM

## 2022-11-13 ENCOUNTER — Ambulatory Visit: Payer: Medicare Other | Attending: Adult Health | Admitting: Physical Therapy

## 2022-11-13 ENCOUNTER — Encounter: Payer: Self-pay | Admitting: Physical Therapy

## 2022-11-13 DIAGNOSIS — M546 Pain in thoracic spine: Secondary | ICD-10-CM | POA: Insufficient documentation

## 2022-11-13 DIAGNOSIS — R293 Abnormal posture: Secondary | ICD-10-CM | POA: Insufficient documentation

## 2022-11-13 DIAGNOSIS — R252 Cramp and spasm: Secondary | ICD-10-CM | POA: Insufficient documentation

## 2022-11-13 DIAGNOSIS — M6281 Muscle weakness (generalized): Secondary | ICD-10-CM | POA: Insufficient documentation

## 2022-11-13 DIAGNOSIS — R262 Difficulty in walking, not elsewhere classified: Secondary | ICD-10-CM | POA: Diagnosis not present

## 2022-11-13 NOTE — Therapy (Signed)
OUTPATIENT PHYSICAL THERAPY THORACIC TREATMENT   Patient Name: Jacob Stone. MRN: [id number] DOB:Jun 03, 1950, 72 y.o., male Today's Date: 11/13/2022  END OF SESSION:  PT End of Session - 11/13/22 1530     Visit Number 5    Date for PT Re-Evaluation 12/19/22    Authorization Type UHC MCR    Authorization Time Period 10/24/2022-12/19/2022    Authorization - Number of Visits 8    PT Start Time 1447    PT Stop Time 1525    PT Time Calculation (min) 38 min    Activity Tolerance Patient tolerated treatment well    Behavior During Therapy WFL for tasks assessed/performed               Past Medical History:  Diagnosis Date   Carotid artery calcification 03/2011   MILD HARD PLAQUE LEFT BULB, 0-39% BIL. ICA STENOSIS, PATENT VERTEBRAL ARTERIES WITH ANTEGRADE FLOW   Diabetes type 2, controlled (HCC)    Diverticulitis    Fatty liver    FH: kidney cancer 2005   right kidney removed    GERD (gastroesophageal reflux disease)    Gout    History of kidney stones    Left kidney   HTN (hypertension)    Hyperlipidemia    OA (osteoarthritis)    Knee   OSA (obstructive sleep apnea) 04/18/2016   SEVERE; UNABLE TO WEAR CPAP MORE THAN 2-3 HOURS PER NIGHT   Plantar fasciitis    Prostate cancer (HCC)    Seasonal allergies    Past Surgical History:  Procedure Laterality Date   COLON SURGERY  2011   Partial colon removed due to diverticulitis   COLONOSCOPY  08/2009   NEPHRECTOMY Right    PROSTATE BIOPSY     RADIOACTIVE SEED IMPLANT N/A 08/03/2017   Procedure: RADIOACTIVE SEED IMPLANT/BRACHYTHERAPY IMPLANT;  Surgeon: Ihor Gully, MD;  Location: [facility];  Service: Urology;  Laterality: N/A;   SPACE OAR INSTILLATION N/A 08/03/2017   Procedure: SPACE OAR INSTILLATION;  Surgeon: Ihor Gully, MD;  Location: [facility];  Service: Urology;  Laterality: N/A;   Patient Active Problem List   Diagnosis Date Noted   Trigger finger, left middle finger  06/02/2021   Right tennis elbow 05/19/2021   History of prostate cancer 09/23/2020   Low back pain 05/21/2019   Malignant neoplasm of prostate (HCC) 04/16/2017   Unilateral primary osteoarthritis, left knee 11/16/2016   OSA (obstructive sleep apnea) 04/18/2016   Sleep dysfunction with arousal disturbance 01/17/2016   Hyperlipidemia 09/14/2014   Diabetes 1.5, managed as type 2 (HCC) 07/07/2014   Acute gout 04/09/2014   Carotid artery calcification 03/02/2011   Malignant neoplasm of kidney excluding renal pelvis (HCC) 11/14/2007   Allergic rhinitis 11/14/2007   Diverticulosis of colon 04/16/2007   Essential hypertension 04/16/2007    PCP: Shirline Frees, NP   REFERRING PROVIDER: Shirline Frees, NP   REFERRING DIAG: T14.8XXA (ICD-10-CM) - Muscle strain  THERAPY DIAG:  Pain in thoracic spine  Cramp and spasm  Muscle weakness (generalized)  Abnormal posture  Rationale for Evaluation and Treatment: Rehabilitation  ONSET DATE: 1 month  SUBJECTIVE:  SUBJECTIVE STATEMENT: Patient reports he is doing okay today. He was on his feet a lot this weekend and his back started bothering him more than normal. Currently 3/10 pain. His back feels tight      Hand dominance: Right  PERTINENT HISTORY:  LBP, DM, HTN, OA  PAIN: 11/13/2022 Are you having pain? Yes: NPRS scale: 3/10 Pain location: more toward the left but mid throracic Pain description: jabbing, intermittent Aggravating factors: reaching, lifting and picking up heavier items Relieving factors: change of position, muscle relaxors  PRECAUTIONS: None  RED FLAGS: None     WEIGHT BEARING RESTRICTIONS: No  FALLS:  Has patient fallen in last 6 months? No  LIVING ENVIRONMENT: Lives with: lives with their family Lives in:  House/apartment  OCCUPATION: part time at [employer]   PLOF: Independent  PATIENT GOALS: Get rid of the pain  NEXT MD VISIT: none scheduled  OBJECTIVE:   DIAGNOSTIC FINDINGS:  N/A  PATIENT SURVEYS:  FOTO 50 (goal 26)  COGNITION: Overall cognitive status: Within functional limits for tasks assessed  SENSATION: WFL  POSTURE: decreased lumbar lordosis  PALPATION: Painful along Tspine especially around T9/10 T5/6 Marked hypomobility with CPA and UPA mobs to thoracic spine     UPPER EXTREMITY ROM: WNL   UPPER EXTREMITY MMT: L Rhomboids 4/5 , R 5/5  MMT Right eval Left eval  Shoulder flexion 4+ 4+  Shoulder extension 5 5  Shoulder abduction 5 5  Shoulder adduction    Shoulder extension    Shoulder internal rotation 5 5  Shoulder external rotation 5 5  Middle trapezius 4+ 4+  Lower trapezius 3+ 4-   (Blank rows = not tested)  LUMBAR ROM: tightness with lateral flexion  Active  A/PROM  eval  Flexion WFL  Extension WNL  Right lateral flexion WFL  Left lateral flexion WFL  Right rotation WNL  Left rotation WNL   (Blank rows = not tested)     TODAY'S TREATMENT:                                                                                                                              DATE:   11/06/22 UBE x 6 min 3/3- PT present to discuss progress LTR x 10 each way Side lying Open Books x 10 each Seated 3 way Stability Ball stretch x8 each way Standing Lt at Counter x 1 minute 4 D ball rolls x 10 each direction with yellow plyo ball each UE 3 way scapular stabilization with blue loop x 8 each side Shoulder flexion with ISO shoulder abduction against blue loop x10 Wall Push Ups 2 x 8 WITTY's with 2 lbs x 8 each each over box (tall side up) Lat pull down (seated) 70 lbs x 20 Seated rows on matrix with 45 lbs x 20 Farmer's Carries 10lb DBs x3 High Row & shoulder ER 1# DB 2x8   11/06/22 UBE x 6 min 3/3 Seated thoracic extension x10  3 way scapular  stabilization with blue loop x 5 each side 4 D ball rolls x 20 each direction with yellow plyo ball Wall Push Ups 2x8 Lat pull down (seated) 70 lbs x 20 Seated rows on matrix with 45 lbs x 20 WITTY's with 2 lbs x 10 each seated with trunk flexion & standing Wall Push Ups Farmer's Carries 10lb DBs x3 3 way stability ball stretch Updated HEP  11/01/22 UBE x 6 min 3/3 3 way scapular stabilization with blue loop x 5 each side 4 D ball rolls x 20 each direction with yellow plyo ball Lat pull down (seated) 70 lbs x 20 Seated rows on matrix with 45 lbs x 20 WITTY's with 2 lbs x 10 each over box (tall side up) Trigger Point Dry-Needling  Treatment instructions: Expect mild to moderate muscle soreness. S/S of pneumothorax if dry needled over a lung field, and to seek immediate medical attention should they occur. Patient verbalized understanding of these instructions and education. Patient Consent Given: Yes Education handout provided: Yes Muscles treated: bilateral upper traps and left rhomboids Electrical stimulation performed: No Parameters: N/A Treatment response/outcome: Skilled palpation used to identify taut bands and trigger points.  Once identified, dry needling techniques used to treat these areas.  Twitch response ellicited along with palpable elongation of muscle.  Following treatment, patient reported mild soreness but relief of muscle tension      PATIENT EDUCATION:  Education details: PT eval findings, anticipated POC, initial HEP, and postural awareness  Person educated: Patient Education method: Explanation, Demonstration, and Handouts Education comprehension: verbalized understanding and returned demonstration  HOME EXERCISE PROGRAM: Access Code: [email or web address] URL: [email or web address] Date: 11/06/2022 Prepared by: Claude Manges  Exercises - Seated Thoracic Lumbar Extension  - 1 x daily - 7 x weekly - 3 sets - 10 reps - Thoracic Extension Mobilization on  Foam Roll  - 2 x daily - 7 x weekly - 2 sets - 5 reps - Sidelying Thoracic Rotation with Open Book  - 1 x daily - 7 x weekly - 2 sets - 5 reps - Child's Pose with Thread the Needle  - 1 x daily - 7 x weekly - 1 sets - 5 reps - 10-20 sec hold - Quadruped With Rotation: Thread The Needle  - 1 x daily - 7 x weekly - 1 sets - 10 reps - Prone Scapular Retraction Y  - 1 x daily - 7 x weekly - 1 sets - 10 reps - Seated Shoulder Y's  - 1 x daily - 7 x weekly - 1 sets - 10 reps - Seated Shoulder Abduction with Dumbbells - Thumbs Up  - 1 x daily - 7 x weekly - 1 sets - 10 reps  ASSESSMENT:  CLINICAL IMPRESSION: Today's treatment session focused on thoracic & lumbar mobility and periscapular strengthening. Patient verbalized his back felt more tight than normal because he was on his feel a lot this weekend. Patient required verbal and visual cueing for correct performance of flexibility exercises. WITTY'S continue to be challenging for patient due to periscapular weakness. Noted improvements in thoracic mobility with open books exercises. Incorporated more overhead shoulder stability exercises which was challenging for patient. Patient will benefit from skilled PT to address the below impairments and improve overall function.    OBJECTIVE IMPAIRMENTS: decreased activity tolerance, decreased ROM, decreased strength, hypomobility, increased muscle spasms, impaired flexibility, postural dysfunction, and pain.   ACTIVITY LIMITATIONS: carrying, lifting, and reaching out and up  PARTICIPATION LIMITATIONS: community activity  and occupation  PERSONAL FACTORS: Age and 3+ comorbidities: chronic LBP, DM, OA  are also affecting patient's functional outcome.   REHAB POTENTIAL: Excellent  CLINICAL DECISION MAKING: Stable/uncomplicated  EVALUATION COMPLEXITY: Low   GOALS: Goals reviewed with patient? Yes  SHORT TERM GOALS: Target date: 11/21/22  Ind with initial HEP Baseline:  Goal status: INITIAL  2.   Decreased thoracic spine pain with activity by 40% Baseline:  Goal status: INITIAL   LONG TERM GOALS: Target date: 12/19/22  Ind with advanced HEP Baseline:  Goal status: INITIAL  2.  Decreased pain in thoracic spine with working out and ADLS by 80% or more to improve QOL. Baseline:  Goal status: INITIAL  3.  Improved thoracic spine mobility to WNL without pain provocation during spinal mobilizations. Baseline:  Goal status: INITIAL  4.  Improved mid/low trap and rhomboid strength to 4+/5. Baseline:  Goal status: INITIAL  5.  Improved FOTO to 63 showing functional improvement Baseline:  Goal status: INITIAL  6.  Able to work out without increased thoracic spine pain Baseline:  Goal status: INITIAL   PLAN:  PT FREQUENCY: 1-2x/week  PT DURATION: 8 weeks  PLANNED INTERVENTIONS: Therapeutic exercises, Therapeutic activity, Neuromuscular re-education, Patient/Family education, Self Care, Joint mobilization, Dry Needling, Spinal mobilization, Cryotherapy, Moist heat, Taping, Traction, Ionotophoresis 4mg /ml Dexamethasone, and Manual therapy  PLAN FOR NEXT SESSION: continue overhead shoulder stability & periscapular strengthening    Claude Manges, PT 11/13/22 3:31 PM [facility] [address], [address] [place], [place] [address] Phone # [phone] Fax [phone]

## 2022-11-18 ENCOUNTER — Other Ambulatory Visit: Payer: Self-pay | Admitting: Adult Health

## 2022-11-18 DIAGNOSIS — E139 Other specified diabetes mellitus without complications: Secondary | ICD-10-CM

## 2022-11-20 ENCOUNTER — Encounter: Payer: Self-pay | Admitting: Physical Therapy

## 2022-11-20 ENCOUNTER — Ambulatory Visit: Payer: Medicare Other | Admitting: Physical Therapy

## 2022-11-20 DIAGNOSIS — R293 Abnormal posture: Secondary | ICD-10-CM

## 2022-11-20 DIAGNOSIS — R252 Cramp and spasm: Secondary | ICD-10-CM | POA: Diagnosis not present

## 2022-11-20 DIAGNOSIS — M6281 Muscle weakness (generalized): Secondary | ICD-10-CM

## 2022-11-20 DIAGNOSIS — M546 Pain in thoracic spine: Secondary | ICD-10-CM

## 2022-11-20 DIAGNOSIS — R262 Difficulty in walking, not elsewhere classified: Secondary | ICD-10-CM | POA: Diagnosis not present

## 2022-11-20 NOTE — Therapy (Signed)
OUTPATIENT PHYSICAL THERAPY THORACIC TREATMENT   Patient Name: Jacob Stone. MRN: [id number] DOB:Sep 25, 1950, 72 y.o., male Today's Date: 11/20/2022  END OF SESSION:  PT End of Session - 11/20/22 1529     Visit Number 6    Date for PT Re-Evaluation 12/19/22    Authorization Type UHC MCR    Authorization Time Period [employer] Approved 8 visits-10/24/2022-12/19/2022    Authorization - Number of Visits 8    PT Start Time 1447    PT Stop Time 1525    PT Time Calculation (min) 38 min    Activity Tolerance Patient tolerated treatment well    Behavior During Therapy WFL for tasks assessed/performed                Past Medical History:  Diagnosis Date   Carotid artery calcification 03/2011   MILD HARD PLAQUE LEFT BULB, 0-39% BIL. ICA STENOSIS, PATENT VERTEBRAL ARTERIES WITH ANTEGRADE FLOW   Diabetes type 2, controlled (HCC)    Diverticulitis    Fatty liver    FH: kidney cancer 2005   right kidney removed    GERD (gastroesophageal reflux disease)    Gout    History of kidney stones    Left kidney   HTN (hypertension)    Hyperlipidemia    OA (osteoarthritis)    Knee   OSA (obstructive sleep apnea) 04/18/2016   SEVERE; UNABLE TO WEAR CPAP MORE THAN 2-3 HOURS PER NIGHT   Plantar fasciitis    Prostate cancer (HCC)    Seasonal allergies    Past Surgical History:  Procedure Laterality Date   COLON SURGERY  2011   Partial colon removed due to diverticulitis   COLONOSCOPY  08/2009   NEPHRECTOMY Right    PROSTATE BIOPSY     RADIOACTIVE SEED IMPLANT N/A 08/03/2017   Procedure: RADIOACTIVE SEED IMPLANT/BRACHYTHERAPY IMPLANT;  Surgeon: Ihor Gully, MD;  Location: [facility];  Service: Urology;  Laterality: N/A;   SPACE OAR INSTILLATION N/A 08/03/2017   Procedure: SPACE OAR INSTILLATION;  Surgeon: Ihor Gully, MD;  Location: [facility];  Service: Urology;  Laterality: N/A;   Patient Active Problem List   Diagnosis Date Noted   Trigger  finger, left middle finger 06/02/2021   Right tennis elbow 05/19/2021   History of prostate cancer 09/23/2020   Low back pain 05/21/2019   Malignant neoplasm of prostate (HCC) 04/16/2017   Unilateral primary osteoarthritis, left knee 11/16/2016   OSA (obstructive sleep apnea) 04/18/2016   Sleep dysfunction with arousal disturbance 01/17/2016   Hyperlipidemia 09/14/2014   Diabetes 1.5, managed as type 2 (HCC) 07/07/2014   Acute gout 04/09/2014   Carotid artery calcification 03/02/2011   Malignant neoplasm of kidney excluding renal pelvis (HCC) 11/14/2007   Allergic rhinitis 11/14/2007   Diverticulosis of colon 04/16/2007   Essential hypertension 04/16/2007    PCP: Shirline Frees, NP   REFERRING PROVIDER: Shirline Frees, NP   REFERRING DIAG: T14.8XXA (ICD-10-CM) - Muscle strain  THERAPY DIAG:  Pain in thoracic spine  Cramp and spasm  Muscle weakness (generalized)  Abnormal posture  Rationale for Evaluation and Treatment: Rehabilitation  ONSET DATE: 1 month  SUBJECTIVE:  SUBJECTIVE STATEMENT: Patient reports he is doing okay today. He just got finished playing pickle ball. His pain is currently 3/10.      Hand dominance: Right  PERTINENT HISTORY:  LBP, DM, HTN, OA  PAIN: 11/20/2022 Are you having pain? Yes: NPRS scale: 3/10 Pain location: more toward the left but mid throracic Pain description: jabbing, intermittent Aggravating factors: reaching, lifting and picking up heavier items Relieving factors: change of position, muscle relaxors  PRECAUTIONS: None  RED FLAGS: None     WEIGHT BEARING RESTRICTIONS: No  FALLS:  Has patient fallen in last 6 months? No  LIVING ENVIRONMENT: Lives with: lives with their family Lives in: House/apartment  OCCUPATION: part  time at [employer]   PLOF: Independent  PATIENT GOALS: Get rid of the pain  NEXT MD VISIT: none scheduled  OBJECTIVE:   DIAGNOSTIC FINDINGS:  N/A  PATIENT SURVEYS:  FOTO 50 (goal 4)  COGNITION: Overall cognitive status: Within functional limits for tasks assessed  SENSATION: WFL  POSTURE: decreased lumbar lordosis  PALPATION: Painful along Tspine especially around T9/10 T5/6 Marked hypomobility with CPA and UPA mobs to thoracic spine     UPPER EXTREMITY ROM: WNL   UPPER EXTREMITY MMT: L Rhomboids 4/5 , R 5/5  MMT Right eval Left eval  Shoulder flexion 4+ 4+  Shoulder extension 5 5  Shoulder abduction 5 5  Shoulder adduction    Shoulder extension    Shoulder internal rotation 5 5  Shoulder external rotation 5 5  Middle trapezius 4+ 4+  Lower trapezius 3+ 4-   (Blank rows = not tested)  LUMBAR ROM: tightness with lateral flexion  Active  A/PROM  eval  Flexion WFL  Extension WNL  Right lateral flexion WFL  Left lateral flexion WFL  Right rotation WNL  Left rotation WNL   (Blank rows = not tested)     TODAY'S TREATMENT:                                                                                                                              DATE:   10/21/22 UBE x 6 min 3/3- PT present to discuss progress Lat pull down (seated) 70 lbs x 20 Seated rows on matrix with 45 lbs x 20 Box Pick ups (from ground & from elevated surface) x 5 each 15lbs in box Unilateral Farmer's Carries 20lb KB x 4 WITTY's with 2 lbs x 8 each over corner of plinth Plank walks on plinth x 5 Bottoms up KB Press 5lb 2 x 10 High Row & shoulder ER 1# DB 2 x 8 Standing Punches 3# x 10 each Standing Shoulder Flexion 3# 2 x 10 Standing Shoulder Abduction 3# 2 x 10  11/06/22 UBE x 6 min 3/3- PT present to discuss progress LTR x 10 each way Side lying Open Books x 10 each Seated 3 way Stability Ball stretch x8 each way Standing Lt at Counter x 1 minute 4 D ball  rolls x 10  each direction with yellow plyo ball each UE 3 way scapular stabilization with blue loop x 8 each side Shoulder flexion with ISO shoulder abduction against blue loop x10 Wall Push Ups 2 x 8 WITTY's with 2 lbs x 8 each each over box (tall side up) Lat pull down (seated) 70 lbs x 20 Seated rows on matrix with 45 lbs x 20 Farmer's Carries 10lb DBs x3 High Row & shoulder ER 1# DB 2x8   11/06/22 UBE x 6 min 3/3 Seated thoracic extension x10 3 way scapular stabilization with blue loop x 5 each side 4 D ball rolls x 20 each direction with yellow plyo ball Wall Push Ups 2x8 Lat pull down (seated) 70 lbs x 20 Seated rows on matrix with 45 lbs x 20 WITTY's with 2 lbs x 10 each seated with trunk flexion & standing Wall Push Ups Farmer's Carries 10lb DBs x3 3 way stability ball stretch Updated HEP     PATIENT EDUCATION:  Education details: PT eval findings, anticipated POC, initial HEP, and postural awareness  Person educated: Patient Education method: Explanation, Demonstration, and Handouts Education comprehension: verbalized understanding and returned demonstration  HOME EXERCISE PROGRAM: Access Code: [email or web address] URL: [email or web address] Date: 11/06/2022 Prepared by: Claude Manges  Exercises - Seated Thoracic Lumbar Extension  - 1 x daily - 7 x weekly - 3 sets - 10 reps - Thoracic Extension Mobilization on Foam Roll  - 2 x daily - 7 x weekly - 2 sets - 5 reps - Sidelying Thoracic Rotation with Open Book  - 1 x daily - 7 x weekly - 2 sets - 5 reps - Child's Pose with Thread the Needle  - 1 x daily - 7 x weekly - 1 sets - 5 reps - 10-20 sec hold - Quadruped With Rotation: Thread The Needle  - 1 x daily - 7 x weekly - 1 sets - 10 reps - Prone Scapular Retraction Y  - 1 x daily - 7 x weekly - 1 sets - 10 reps - Seated Shoulder Y's  - 1 x daily - 7 x weekly - 1 sets - 10 reps - Seated Shoulder Abduction with Dumbbells - Thumbs Up  - 1 x daily - 7 x weekly - 1 sets - 10  reps  ASSESSMENT:  CLINICAL IMPRESSION: Today's treatment session focused on periscapular strengthening and lifting technique. Patient verbalized challenge with overhead lifting activities so incorporated those activities today. Noted good lifting technique when lifting from ground and patient required minimal verbal cues for form correction. Patient required moderate verbal cues for form correction while performing standing high row exercise. Patient will benefit from skilled PT to address the below impairments and improve overall function.    OBJECTIVE IMPAIRMENTS: decreased activity tolerance, decreased ROM, decreased strength, hypomobility, increased muscle spasms, impaired flexibility, postural dysfunction, and pain.   ACTIVITY LIMITATIONS: carrying, lifting, and reaching out and up  PARTICIPATION LIMITATIONS: community activity and occupation  PERSONAL FACTORS: Age and 3+ comorbidities: chronic LBP, DM, OA  are also affecting patient's functional outcome.   REHAB POTENTIAL: Excellent  CLINICAL DECISION MAKING: Stable/uncomplicated  EVALUATION COMPLEXITY: Low   GOALS: Goals reviewed with patient? Yes  SHORT TERM GOALS: Target date: 11/21/22  Ind with initial HEP Baseline:  Goal status: INITIAL  2.  Decreased thoracic spine pain with activity by 40% Baseline:  Goal status: INITIAL   LONG TERM GOALS: Target date: 12/19/22  Ind with advanced HEP Baseline:  Goal status: INITIAL  2.  Decreased pain in thoracic spine with working out and ADLS by 80% or more to improve QOL. Baseline:  Goal status: INITIAL  3.  Improved thoracic spine mobility to WNL without pain provocation during spinal mobilizations. Baseline:  Goal status: INITIAL  4.  Improved mid/low trap and rhomboid strength to 4+/5. Baseline:  Goal status: INITIAL  5.  Improved FOTO to 63 showing functional improvement Baseline:  Goal status: INITIAL  6.  Able to work out without increased thoracic  spine pain Baseline:  Goal status: INITIAL   PLAN:  PT FREQUENCY: 1-2x/week  PT DURATION: 8 weeks  PLANNED INTERVENTIONS: Therapeutic exercises, Therapeutic activity, Neuromuscular re-education, Patient/Family education, Self Care, Joint mobilization, Dry Needling, Spinal mobilization, Cryotherapy, Moist heat, Taping, Traction, Ionotophoresis 4mg /ml Dexamethasone, and Manual therapy  PLAN FOR NEXT SESSION: Continue working over head shoulder stability; DN as needed     Claude Manges, PT 11/20/22 3:30 PM  [facility] [address], [address] [place], [place] [address] Phone # [phone] Fax [phone]

## 2022-11-22 ENCOUNTER — Ambulatory Visit: Payer: Medicare Other

## 2022-11-22 DIAGNOSIS — M546 Pain in thoracic spine: Secondary | ICD-10-CM | POA: Diagnosis not present

## 2022-11-22 DIAGNOSIS — M6281 Muscle weakness (generalized): Secondary | ICD-10-CM

## 2022-11-22 DIAGNOSIS — R252 Cramp and spasm: Secondary | ICD-10-CM | POA: Diagnosis not present

## 2022-11-22 DIAGNOSIS — R262 Difficulty in walking, not elsewhere classified: Secondary | ICD-10-CM

## 2022-11-22 DIAGNOSIS — R293 Abnormal posture: Secondary | ICD-10-CM | POA: Diagnosis not present

## 2022-11-22 NOTE — Therapy (Signed)
OUTPATIENT PHYSICAL THERAPY THORACIC TREATMENT   Patient Name: Jacob Stone. MRN: [id number] DOB:[birth year], 72 y.o., male Today's Date: 11/22/2022  END OF SESSION:  PT End of Session - 11/22/22 1451     Visit Number 7    Date for PT Re-Evaluation 12/19/22    Authorization Type UHC MCR    Authorization Time Period OPTUM Approved 8 visits-10/24/2022-12/19/2022    Authorization - Visit Number 7    Authorization - Number of Visits 8    Progress Note Due on Visit 10    PT Start Time 1451    PT Stop Time 1532    PT Time Calculation (min) 41 min    Activity Tolerance Patient tolerated treatment well    Behavior During Therapy WFL for tasks assessed/performed                Past Medical History:  Diagnosis Date   Carotid artery calcification 03/2011   MILD HARD PLAQUE LEFT BULB, 0-39% BIL. ICA STENOSIS, PATENT VERTEBRAL ARTERIES WITH ANTEGRADE FLOW   Diabetes type 2, controlled (HCC)    Diverticulitis    Fatty liver    FH: kidney cancer 2005   right kidney removed    GERD (gastroesophageal reflux disease)    Gout    History of kidney stones    Left kidney   HTN (hypertension)    Hyperlipidemia    OA (osteoarthritis)    Knee   OSA (obstructive sleep apnea) 04/18/2016   SEVERE; UNABLE TO WEAR CPAP MORE THAN 2-3 HOURS PER NIGHT   Plantar fasciitis    Prostate cancer (HCC)    Seasonal allergies    Past Surgical History:  Procedure Laterality Date   COLON SURGERY  2011   Partial colon removed due to diverticulitis   COLONOSCOPY  08/2009   NEPHRECTOMY Right    PROSTATE BIOPSY     RADIOACTIVE SEED IMPLANT N/A 08/03/2017   Procedure: RADIOACTIVE SEED IMPLANT/BRACHYTHERAPY IMPLANT;  Surgeon: Ihor Gully, MD;  Location: [facility];  Service: Urology;  Laterality: N/A;   SPACE OAR INSTILLATION N/A 08/03/2017   Procedure: SPACE OAR INSTILLATION;  Surgeon: Ihor Gully, MD;  Location: [facility];  Service: Urology;  Laterality: N/A;    Patient Active Problem List   Diagnosis Date Noted   Trigger finger, left middle finger 06/02/2021   Right tennis elbow 05/19/2021   History of prostate cancer 09/23/2020   Low back pain 05/21/2019   Malignant neoplasm of prostate (HCC) 04/16/2017   Unilateral primary osteoarthritis, left knee 11/16/2016   OSA (obstructive sleep apnea) 04/18/2016   Sleep dysfunction with arousal disturbance 01/17/2016   Hyperlipidemia 09/14/2014   Diabetes 1.5, managed as type 2 (HCC) 07/07/2014   Acute gout 04/09/2014   Carotid artery calcification 03/02/2011   Malignant neoplasm of kidney excluding renal pelvis (HCC) 11/14/2007   Allergic rhinitis 11/14/2007   Diverticulosis of colon 04/16/2007   Essential hypertension 04/16/2007    PCP: Shirline Frees, NP   REFERRING PROVIDER: Shirline Frees, NP   REFERRING DIAG: T14.8XXA (ICD-10-CM) - Muscle strain  THERAPY DIAG:  Pain in thoracic spine  Cramp and spasm  Muscle weakness (generalized)  Abnormal posture  Difficulty in walking, not elsewhere classified  Rationale for Evaluation and Treatment: Rehabilitation  ONSET DATE: 1 month  SUBJECTIVE:  SUBJECTIVE STATEMENT: Patient reports he is doing okay today. He just got finished playing pickle ball. His pain is currently 3/10.      Hand dominance: Right  PERTINENT HISTORY:  LBP, DM, HTN, OA  PAIN: 11/20/2022 Are you having pain? Yes: NPRS scale: 3/10 Pain location: more toward the left but mid throracic Pain description: jabbing, intermittent Aggravating factors: reaching, lifting and picking up heavier items Relieving factors: change of position, muscle relaxors  PRECAUTIONS: None  RED FLAGS: None     WEIGHT BEARING RESTRICTIONS: No  FALLS:  Has patient fallen in last 6  months? 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   PLOF: Independent  PATIENT GOALS: Get rid of the pain  NEXT MD VISIT: none scheduled  OBJECTIVE:   DIAGNOSTIC FINDINGS:  N/A  PATIENT SURVEYS:  FOTO 50 (goal 72)  COGNITION: Overall cognitive status: Within functional limits for tasks assessed  SENSATION: WFL  POSTURE: decreased lumbar lordosis  PALPATION: Painful along Tspine especially around T9/10 T5/6 Marked hypomobility with CPA and UPA mobs to thoracic spine     UPPER EXTREMITY ROM: WNL   UPPER EXTREMITY MMT: L Rhomboids 4/5 , R 5/5  MMT Right eval Left eval  Shoulder flexion 4+ 4+  Shoulder extension 5 5  Shoulder abduction 5 5  Shoulder adduction    Shoulder extension    Shoulder internal rotation 5 5  Shoulder external rotation 5 5  Middle trapezius 4+ 4+  Lower trapezius 3+ 4-   (Blank rows = not tested)  LUMBAR ROM: tightness with lateral flexion  Active  A/PROM  eval  Flexion WFL  Extension WNL  Right lateral flexion WFL  Left lateral flexion WFL  Right rotation WNL  Left rotation WNL   (Blank rows = not tested)     TODAY'S TREATMENT:                                                                                                                              DATE:   11/22/22 UBE x 6 min 3/3- PT present to discuss progress 3 way scapular stabilization with yellow loop x 10 each side 4 D ball rolls with yellow plyo ball x 20 each on each UE Lat pull down (seated) 70 lbs x 20 Seated rows on matrix with 45 lbs x 20 Plank ups 2 x 5 Trigger Point Dry-Needling  Treatment instructions: Expect mild to moderate muscle soreness. S/S of pneumothorax if dry needled over a lung field, and to seek immediate medical attention should they occur. Patient verbalized understanding of these instructions and education. Patient Consent Given: Yes Education handout  provided: Yes Muscles treated: bilateral upper traps and levator, rhomboids bilaterally Electrical stimulation performed: No Parameters: N/A Treatment response/outcome: Skilled palpation used to identify taut bands and trigger points.  Once identified, dry needling techniques used to treat these areas.  Twitch response ellicited  along with palpable elongation of muscle.  Following treatment, patient reports significant relief of neck pain.     11/20/22 UBE x 6 min 3/3- PT present to discuss progress Lat pull down (seated) 70 lbs x 20 Seated rows on matrix with 45 lbs x 20 Box Pick ups (from ground & from elevated surface) x 5 each 15lbs in box Unilateral Farmer's Carries 20lb KB x 4 WITTY's with 2 lbs x 8 each over corner of plinth Plank walks on plinth x 5 Bottoms up KB Press 5lb 2 x 10 High Row & shoulder ER 1# DB 2 x 8 Standing Punches 3# x 10 each Standing Shoulder Flexion 3# 2 x 10 Standing Shoulder Abduction 3# 2 x 10  11/13/22 UBE x 6 min 3/3- PT present to discuss progress LTR x 10 each way Side lying Open Books x 10 each Seated 3 way Stability Ball stretch x8 each way Standing Lt at Counter x 1 minute 4 D ball rolls x 10 each direction with yellow plyo ball each UE 3 way scapular stabilization with blue loop x 8 each side Shoulder flexion with ISO shoulder abduction against blue loop x10 Wall Push Ups 2 x 8 WITTY's with 2 lbs x 8 each each over box (tall side up) Lat pull down (seated) 70 lbs x 20 Seated rows on matrix with 45 lbs x 20 Farmer's Carries 10lb DBs x3 High Row & shoulder ER 1# DB 2x8   PATIENT EDUCATION:  Education details: PT eval findings, anticipated POC, initial HEP, and postural awareness  Person educated: Patient Education method: Explanation, Demonstration, and Handouts Education comprehension: verbalized understanding and returned demonstration  HOME EXERCISE PROGRAM: Access Code: [email or web address] URL: [email or web address] Date:  11/06/2022 Prepared by: Claude Manges  Exercises - Seated Thoracic Lumbar Extension  - 1 x daily - 7 x weekly - 3 sets - 10 reps - Thoracic Extension Mobilization on Foam Roll  - 2 x daily - 7 x weekly - 2 sets - 5 reps - Sidelying Thoracic Rotation with Open Book  - 1 x daily - 7 x weekly - 2 sets - 5 reps - Child's Pose with Thread the Needle  - 1 x daily - 7 x weekly - 1 sets - 5 reps - 10-20 sec hold - Quadruped With Rotation: Thread The Needle  - 1 x daily - 7 x weekly - 1 sets - 10 reps - Prone Scapular Retraction Y  - 1 x daily - 7 x weekly - 1 sets - 10 reps - Seated Shoulder Y's  - 1 x daily - 7 x weekly - 1 sets - 10 reps - Seated Shoulder Abduction with Dumbbells - Thumbs Up  - 1 x daily - 7 x weekly - 1 sets - 10 reps  ASSESSMENT:  CLINICAL IMPRESSION: Jacob Stone is progressing appropriately.  His primary issue is posture.  He continues to be somewhat rounded and with limited lumbar lordosis.  He is very active, continuing to play pickle ball with no significant issues.  He is tolerating more challenging activities without exacerbation of pain.  We added plank ups today.  He did fatigue quite easily and needed verbal cues for correct plank position (bottom down).  He should continue to improve.  He is well motivated and compliant.   Patient will benefit from skilled PT to address the below impairments and improve overall function.    OBJECTIVE IMPAIRMENTS: decreased activity tolerance, decreased ROM, decreased strength, hypomobility, increased muscle spasms, impaired flexibility, postural dysfunction,  and pain.   ACTIVITY LIMITATIONS: carrying, lifting, and reaching out and up  PARTICIPATION LIMITATIONS: community activity and occupation  PERSONAL FACTORS: Age and 3+ comorbidities: chronic LBP, DM, OA  are also affecting patient's functional outcome.   REHAB POTENTIAL: Excellent  CLINICAL DECISION MAKING: Stone/uncomplicated  EVALUATION COMPLEXITY: Low   GOALS: Goals reviewed  with patient? Yes  SHORT TERM GOALS: Target date: 11/21/22  Ind with initial HEP Baseline:  Goal status: MET 11/22/22  2.  Decreased thoracic spine pain with activity by 40% Baseline:  Goal status: MET 11/22/22 (patient reports 50% better)   LONG TERM GOALS: Target date: 12/19/22  Ind with advanced HEP Baseline:  Goal status: INITIAL  2.  Decreased pain in thoracic spine with working out and ADLS by 80% or more to improve QOL. Baseline:  Goal status: INITIAL  3.  Improved thoracic spine mobility to WNL without pain provocation during spinal mobilizations. Baseline:  Goal status: INITIAL  4.  Improved mid/low trap and rhomboid strength to 4+/5. Baseline:  Goal status: INITIAL  5.  Improved FOTO to 63 showing functional improvement Baseline:  Goal status: INITIAL  6.  Able to work out without increased thoracic spine pain Baseline:  Goal status: INITIAL   PLAN:  PT FREQUENCY: 1-2x/week  PT DURATION: 8 weeks  PLANNED INTERVENTIONS: Therapeutic exercises, Therapeutic activity, Neuromuscular re-education, Patient/Family education, Self Care, Joint mobilization, Dry Needling, Spinal mobilization, Cryotherapy, Moist heat, Taping, Traction, Ionotophoresis 4mg /ml Dexamethasone, and Manual therapy  PLAN FOR NEXT SESSION: Continue postural strength and stability along with thoracic mobility,  working over head shoulder stability; DN as needed     Mickaela Starlin B. Tanuj Mullens, PT 11/22/22 3:40 PM [facility] [address], [address] [place], [place] [address] Phone # [phone] Fax [phone]

## 2022-11-27 ENCOUNTER — Ambulatory Visit: Payer: Medicare Other | Admitting: Physical Therapy

## 2022-11-27 ENCOUNTER — Telehealth: Payer: Self-pay | Admitting: Physical Therapy

## 2022-11-27 NOTE — Telephone Encounter (Signed)
Left a voicemail on patient's answering machine about missed visit [id number] at 2:45pm. Provided patient with a call back number.  Claude Manges, PT 11/27/22 3:17 PM

## 2022-11-29 ENCOUNTER — Ambulatory Visit: Payer: Medicare Other

## 2022-11-29 ENCOUNTER — Telehealth: Payer: Self-pay

## 2022-11-29 NOTE — Telephone Encounter (Signed)
Call placed to patient to inform of missed visit.  He had missed a visit on 10/21 but stated he had called and left a voice mail.  No answer today.  Left voice mail to request a call back to let us know if he was going to keep his appointments.  Phone number provided.

## 2022-12-04 ENCOUNTER — Encounter: Payer: Self-pay | Admitting: Physical Therapy

## 2022-12-04 ENCOUNTER — Ambulatory Visit: Payer: Medicare Other | Admitting: Physical Therapy

## 2022-12-04 DIAGNOSIS — R293 Abnormal posture: Secondary | ICD-10-CM | POA: Diagnosis not present

## 2022-12-04 DIAGNOSIS — M6281 Muscle weakness (generalized): Secondary | ICD-10-CM | POA: Diagnosis not present

## 2022-12-04 DIAGNOSIS — M546 Pain in thoracic spine: Secondary | ICD-10-CM | POA: Diagnosis not present

## 2022-12-04 DIAGNOSIS — R252 Cramp and spasm: Secondary | ICD-10-CM

## 2022-12-04 DIAGNOSIS — R262 Difficulty in walking, not elsewhere classified: Secondary | ICD-10-CM | POA: Diagnosis not present

## 2022-12-04 NOTE — Therapy (Signed)
OUTPATIENT PHYSICAL THERAPY THORACIC TREATMENT   Patient Name: Jacob Stone. MRN: [id number] DOB:[birth year], 72 y.o., male Today's Date: 12/04/2022  END OF SESSION:  PT End of Session - 12/04/22 1523     Visit Number 8    Date for PT Re-Evaluation 01/06/23    Authorization Type UHC MCR    Authorization Time Period OPTUM Approved 8 visits-10/24/2022-12/19/2022    Authorization - Visit Number 8    Authorization - Number of Visits 8    Progress Note Due on Visit 10    PT Start Time 1449    PT Stop Time 1534    PT Time Calculation (min) 45 min    Activity Tolerance Patient tolerated treatment well    Behavior During Therapy WFL for tasks assessed/performed                 Past Medical History:  Diagnosis Date   Carotid artery calcification 03/2011   MILD HARD PLAQUE LEFT BULB, 0-39% BIL. ICA STENOSIS, PATENT VERTEBRAL ARTERIES WITH ANTEGRADE FLOW   Diabetes type 2, controlled (HCC)    Diverticulitis    Fatty liver    FH: kidney cancer 2005   right kidney removed    GERD (gastroesophageal reflux disease)    Gout    History of kidney stones    Left kidney   HTN (hypertension)    Hyperlipidemia    OA (osteoarthritis)    Knee   OSA (obstructive sleep apnea) 04/18/2016   SEVERE; UNABLE TO WEAR CPAP MORE THAN 2-3 HOURS PER NIGHT   Plantar fasciitis    Prostate cancer (HCC)    Seasonal allergies    Past Surgical History:  Procedure Laterality Date   COLON SURGERY  2011   Partial colon removed due to diverticulitis   COLONOSCOPY  08/2009   NEPHRECTOMY Right    PROSTATE BIOPSY     RADIOACTIVE SEED IMPLANT N/A 08/03/2017   Procedure: RADIOACTIVE SEED IMPLANT/BRACHYTHERAPY IMPLANT;  Surgeon: Ihor Gully, MD;  Location: [facility] [place];  Service: Urology;  Laterality: N/A;   SPACE OAR INSTILLATION N/A 08/03/2017   Procedure: SPACE OAR INSTILLATION;  Surgeon: Ihor Gully, MD;  Location: [facility];  Service: Urology;  Laterality: N/A;    Patient Active Problem List   Diagnosis Date Noted   Trigger finger, left middle finger 06/02/2021   Right tennis elbow 05/19/2021   History of prostate cancer 09/23/2020   Low back pain 05/21/2019   Malignant neoplasm of prostate (HCC) 04/16/2017   Unilateral primary osteoarthritis, left knee 11/16/2016   OSA (obstructive sleep apnea) 04/18/2016   Sleep dysfunction with arousal disturbance 01/17/2016   Hyperlipidemia 09/14/2014   Diabetes 1.5, managed as type 2 (HCC) 07/07/2014   Acute gout 04/09/2014   Carotid artery calcification 03/02/2011   Malignant neoplasm of kidney excluding renal pelvis (HCC) 11/14/2007   Allergic rhinitis 11/14/2007   Diverticulosis of colon 04/16/2007   Essential hypertension 04/16/2007    PCP: Shirline Frees, NP   REFERRING PROVIDER: Shirline Frees, NP   REFERRING DIAG: T14.8XXA (ICD-10-CM) - Muscle strain  THERAPY DIAG:  Pain in thoracic spine  Cramp and spasm  Abnormal posture  Muscle weakness (generalized)  Rationale for Evaluation and Treatment: Rehabilitation  ONSET DATE: 1 month  SUBJECTIVE:  SUBJECTIVE STATEMENT: Patient reports he is going good today. He had a conflict of appointments last treatment sessions. A few times this weekend he felt like he shoulder was going to give away but it didn't. Currently pain is 3/10.      Hand dominance: Right  PERTINENT HISTORY:  LBP, DM, HTN, OA  PAIN: 12/04/2022 Are you having pain? Yes: NPRS scale: 3/10 Pain location: more toward the left but mid throracic Pain description: jabbing, intermittent Aggravating factors: reaching, lifting and picking up heavier items Relieving factors: change of position, muscle relaxors  PRECAUTIONS: None  RED FLAGS: None     WEIGHT BEARING  RESTRICTIONS: No  FALLS:  Has patient fallen in last 6 months? 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   PLOF: Independent  PATIENT GOALS: Get rid of the pain  NEXT MD VISIT: none scheduled  OBJECTIVE:   DIAGNOSTIC FINDINGS:  N/A  PATIENT SURVEYS:  FOTO 50 (goal 43) 12/04/2022 : 50   COGNITION: Overall cognitive status: Within functional limits for tasks assessed  SENSATION: WFL  POSTURE: decreased lumbar lordosis  PALPATION: Painful along Tspine especially around T9/10 T5/6 Marked hypomobility with CPA and UPA mobs to thoracic spine     UPPER EXTREMITY ROM: WNL   UPPER EXTREMITY MMT: L Rhomboids 4/5 , R 5/5        Reassessment 12/04/2022: Lt rhomboids 4+/5 Rt rhomboids 5/5  MMT Right eval Reassessment 12/04/2022 Left eval Reassessment 12/04/2022  Shoulder flexion 4+  4+   Shoulder extension 5  5   Shoulder abduction 5  5   Shoulder adduction      Shoulder extension      Shoulder internal rotation 5  5   Shoulder external rotation 5  5   Middle trapezius 4+  4+   Lower trapezius 3+ 4- 4- 4-   (Blank rows = not tested)  LUMBAR ROM: tightness with lateral flexion  Active  A/PROM  eval  Flexion WFL  Extension WNL  Right lateral flexion WFL  Left lateral flexion WFL  Right rotation WNL  Left rotation WNL   (Blank rows = not tested)     TODAY'S TREATMENT:                                                                                                                              DATE:   12/04/2022 UBE x 6 min 3/3- PT present to discuss progress 3 way scapular stabilization with yellow loop x 10 each side 4 D ball rolls with yellow plyo ball x 20 each on each UE Lat pull down (seated) 70 lbs x 20 Seated rows on matrix with 45 lbs x 20 Triceps Extension 30# 2 x 10 Shoulder flexion with green TB  with iso ER (looped on stair) 2 x 10 WITTY's with 2 lbs x 8 each  over corner of plinth Standing Shoulder  Flexion 3# 2 x 10 Standing Shoulder Abduction 3# 2 x 10 Plank ups 2 x 8 Plank walks on plinth x 5 Bottoms up KB Press 5lb 2 x 10 Unilateral Farmer's Carries 20lb KB x 4 High Row & shoulder ER 1# DB 2 x 8 Bosu Ball (blue side down) ball balance 2 x 45 sec Muscle Testing FOTO assessment  11/22/22 UBE x 6 min 3/3- PT present to discuss progress 3 way scapular stabilization with yellow loop x 10 each side 4 D ball rolls with yellow plyo ball x 20 each on each UE Lat pull down (seated) 70 lbs x 20 Seated rows on matrix with 45 lbs x 20 Plank ups 2 x 5 Trigger Point Dry-Needling  Treatment instructions: Expect mild to moderate muscle soreness. S/S of pneumothorax if dry needled over a lung field, and to seek immediate medical attention should they occur. Patient verbalized understanding of these instructions and education. Patient Consent Given: Yes Education handout provided: Yes Muscles treated: bilateral upper traps and levator, rhomboids bilaterally Electrical stimulation performed: No Parameters: N/A Treatment response/outcome: Skilled palpation used to identify taut bands and trigger points.  Once identified, dry needling techniques used to treat these areas.  Twitch response ellicited along with palpable elongation of muscle.  Following treatment, patient reports significant relief of neck pain.     11/20/22 UBE x 6 min 3/3- PT present to discuss progress Lat pull down (seated) 70 lbs x 20 Seated rows on matrix with 45 lbs x 20 Box Pick ups (from ground & from elevated surface) x 5 each 15lbs in box Unilateral Farmer's Carries 20lb KB x 4 WITTY's with 2 lbs x 8 each over corner of plinth Plank walks on plinth x 5 Bottoms up KB Press 5lb 2 x 10 High Row & shoulder ER 1# DB 2 x 8 Standing Punches 3# x 10 each Standing Shoulder Flexion 3# 2 x 10 Standing Shoulder Abduction 3# 2 x 10   PATIENT EDUCATION:  Education details: PT eval  findings, anticipated POC, initial HEP, and postural awareness  Person educated: Patient Education method: Explanation, Demonstration, and Handouts Education comprehension: verbalized understanding and returned demonstration  HOME EXERCISE PROGRAM: Access Code: [email or web address] URL: [email or web address] Date: 11/06/2022 Prepared by: Claude Manges  Exercises - Seated Thoracic Lumbar Extension  - 1 x daily - 7 x weekly - 3 sets - 10 reps - Thoracic Extension Mobilization on Foam Roll  - 2 x daily - 7 x weekly - 2 sets - 5 reps - Sidelying Thoracic Rotation with Open Book  - 1 x daily - 7 x weekly - 2 sets - 5 reps - Child's Pose with Thread the Needle  - 1 x daily - 7 x weekly - 1 sets - 5 reps - 10-20 sec hold - Quadruped With Rotation: Thread The Needle  - 1 x daily - 7 x weekly - 1 sets - 10 reps - Prone Scapular Retraction Y  - 1 x daily - 7 x weekly - 1 sets - 10 reps - Seated Shoulder Y's  - 1 x daily - 7 x weekly - 1 sets - 10 reps - Seated Shoulder Abduction with Dumbbells - Thumbs Up  - 1 x daily - 7 x weekly - 1 sets - 10 reps  ASSESSMENT:  CLINICAL IMPRESSION: Today's treatment session focused on postural strengthening. Patient verbalized feeling 60-70% better since starting therapy. Reassessed patient's FOTO measure and his score is still the same since evaluation. Picking up heavy objects  and reaching are still challenging for patient. Because of this it is hard for him to perform work duties without increased pain. Noted some improvement's in patient's strength, however his bilateral lower traps are still weak. Patient required verbal cues for correct form of exercises in a plan position. Patient will benefit from skilled PT to address the below impairments and improve overall function.   OBJECTIVE IMPAIRMENTS: decreased activity tolerance, decreased ROM, decreased strength, hypomobility, increased muscle spasms, impaired flexibility, postural dysfunction, and pain.    ACTIVITY LIMITATIONS: carrying, lifting, and reaching out and up  PARTICIPATION LIMITATIONS: community activity and occupation  PERSONAL FACTORS: Age and 3+ comorbidities: chronic LBP, DM, OA  are also affecting patient's functional outcome.   REHAB POTENTIAL: Excellent  CLINICAL DECISION MAKING: Stable/uncomplicated  EVALUATION COMPLEXITY: Low   GOALS: Goals reviewed with patient? Yes  SHORT TERM GOALS: Target date: 11/21/22  Ind with initial HEP Baseline:  Goal status: MET 11/22/22  2.  Decreased thoracic spine pain with activity by 40% Baseline:  Goal status: MET 11/22/22 (patient reports 50% better)   LONG TERM GOALS: Target date: 12/19/22  Ind with advanced HEP Baseline:  Goal status: IN PROGRESS 12/04/2022  2.  Decreased pain in thoracic spine with working out and ADLS by 80% or more to improve QOL. Baseline:  Goal status: IN PROGRESS 12/04/2022  3.  Improved thoracic spine mobility to WNL without pain provocation during spinal mobilizations. Baseline:  Goal status: IN PROGRESS 12/04/2022  4.  Improved mid/low trap and rhomboid strength to 4+/5. Baseline:  Goal status: IN PROGRESS 12/04/2022  5.  Improved FOTO to 63 showing functional improvement Baseline: 12/04/2022 : 50 Goal status: IN PROGRESS 12/04/2022  6.  Able to work out without increased thoracic spine pain Baseline:  Goal status: IN PROGRESS 12/04/2022   PLAN:  PT FREQUENCY: 1-2x/week  PT DURATION: 8 weeks  PLANNED INTERVENTIONS: Therapeutic exercises, Therapeutic activity, Neuromuscular re-education, Patient/Family education, Self Care, Joint mobilization, Dry Needling, Spinal mobilization, Cryotherapy, Moist heat, Taping, Traction, Ionotophoresis 4mg /ml Dexamethasone, and Manual therapy  PLAN FOR NEXT SESSION: Continue postural strength and stability along with thoracic mobility,  working over head shoulder stability; DN as needed     Claude Manges, PT 12/04/22 5:25  PM  [facility] [address], [address] [place], [place] [address] Phone # [phone] Fax [phone]

## 2022-12-06 ENCOUNTER — Ambulatory Visit: Payer: Medicare Other

## 2022-12-06 DIAGNOSIS — R252 Cramp and spasm: Secondary | ICD-10-CM

## 2022-12-06 DIAGNOSIS — M546 Pain in thoracic spine: Secondary | ICD-10-CM

## 2022-12-06 DIAGNOSIS — R293 Abnormal posture: Secondary | ICD-10-CM

## 2022-12-06 DIAGNOSIS — R262 Difficulty in walking, not elsewhere classified: Secondary | ICD-10-CM | POA: Diagnosis not present

## 2022-12-06 DIAGNOSIS — M6281 Muscle weakness (generalized): Secondary | ICD-10-CM | POA: Diagnosis not present

## 2022-12-06 NOTE — Therapy (Signed)
OUTPATIENT PHYSICAL THERAPY THORACIC TREATMENT   Patient Name: Jacob Stone. MRN: [id number] DOB:Dec 29, 1950, 72 y.o., male Today's Date: 12/06/2022  END OF SESSION:  PT End of Session - 12/06/22 1529     Visit Number 9    Date for PT Re-Evaluation 01/06/23    Authorization Type UHC MCR    Authorization Time Period OPTUM Approved 8 visits-10/24/2022-12/19/2022 (more requested on 12/04/22)    Authorization - Number of Visits 8    Progress Note Due on Visit 10    PT Start Time 1445    PT Stop Time 1525    PT Time Calculation (min) 40 min    Activity Tolerance Patient tolerated treatment well    Behavior During Therapy WFL for tasks assessed/performed                  Past Medical History:  Diagnosis Date   Carotid artery calcification 03/2011   MILD HARD PLAQUE LEFT BULB, 0-39% BIL. ICA STENOSIS, PATENT VERTEBRAL ARTERIES WITH ANTEGRADE FLOW   Diabetes type 2, controlled (HCC)    Diverticulitis    Fatty liver    FH: kidney cancer 2005   right kidney removed    GERD (gastroesophageal reflux disease)    Gout    History of kidney stones    Left kidney   HTN (hypertension)    Hyperlipidemia    OA (osteoarthritis)    Knee   OSA (obstructive sleep apnea) 04/18/2016   SEVERE; UNABLE TO WEAR CPAP MORE THAN 2-3 HOURS PER NIGHT   Plantar fasciitis    Prostate cancer (HCC)    Seasonal allergies    Past Surgical History:  Procedure Laterality Date   COLON SURGERY  2011   Partial colon removed due to diverticulitis   COLONOSCOPY  08/2009   NEPHRECTOMY Right    PROSTATE BIOPSY     RADIOACTIVE SEED IMPLANT N/A 08/03/2017   Procedure: RADIOACTIVE SEED IMPLANT/BRACHYTHERAPY IMPLANT;  Surgeon: Ihor Gully, MD;  Location: [facility];  Service: Urology;  Laterality: N/A;   SPACE OAR INSTILLATION N/A 08/03/2017   Procedure: SPACE OAR INSTILLATION;  Surgeon: Ihor Gully, MD;  Location: [facility];  Service: Urology;  Laterality: N/A;    Patient Active Problem List   Diagnosis Date Noted   Trigger finger, left middle finger 06/02/2021   Right tennis elbow 05/19/2021   History of prostate cancer 09/23/2020   Low back pain 05/21/2019   Malignant neoplasm of prostate (HCC) 04/16/2017   Unilateral primary osteoarthritis, left knee 11/16/2016   OSA (obstructive sleep apnea) 04/18/2016   Sleep dysfunction with arousal disturbance 01/17/2016   Hyperlipidemia 09/14/2014   Diabetes 1.5, managed as type 2 (HCC) 07/07/2014   Acute gout 04/09/2014   Carotid artery calcification 03/02/2011   Malignant neoplasm of kidney excluding renal pelvis (HCC) 11/14/2007   Allergic rhinitis 11/14/2007   Diverticulosis of colon 04/16/2007   Essential hypertension 04/16/2007    PCP: Shirline Frees, NP   REFERRING PROVIDER: Shirline Frees, NP   REFERRING DIAG: T14.8XXA (ICD-10-CM) - Muscle strain  THERAPY DIAG:  Pain in thoracic spine  Cramp and spasm  Abnormal posture  Muscle weakness (generalized)  Rationale for Evaluation and Treatment: Rehabilitation  ONSET DATE: 1 month  SUBJECTIVE:  SUBJECTIVE STATEMENT: Patient reports he is still doing good with his upper back.  "It felt like it was going to go out on me the other day but it never did."  Pain is still 3/10.  Patient reports an injury today at the back of the left knee when playing pickle ball today.        Hand dominance: Right  PERTINENT HISTORY:  LBP, DM, HTN, OA  PAIN: 12/04/2022 Are you having pain? Yes: NPRS scale: 3/10 Pain location: more toward the left but mid throracic Pain description: jabbing, intermittent Aggravating factors: reaching, lifting and picking up heavier items Relieving factors: change of position, muscle relaxors  PRECAUTIONS: None  RED  FLAGS: None     WEIGHT BEARING RESTRICTIONS: No  FALLS:  Has patient fallen in last 6 months? 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   PLOF: Independent  PATIENT GOALS: Get rid of the pain  NEXT MD VISIT: none scheduled  OBJECTIVE:   DIAGNOSTIC FINDINGS:  N/A  PATIENT SURVEYS:  FOTO 50 (goal 77) 12/04/2022 : 50   COGNITION: Overall cognitive status: Within functional limits for tasks assessed  SENSATION: WFL  POSTURE: decreased lumbar lordosis  PALPATION: Painful along Tspine especially around T9/10 T5/6 Marked hypomobility with CPA and UPA mobs to thoracic spine     UPPER EXTREMITY ROM: WNL   UPPER EXTREMITY MMT: L Rhomboids 4/5 , R 5/5        Reassessment 12/04/2022: Lt rhomboids 4+/5 Rt rhomboids 5/5  MMT Right eval Reassessment 12/04/2022 Left eval Reassessment 12/04/2022  Shoulder flexion 4+  4+   Shoulder extension 5  5   Shoulder abduction 5  5   Shoulder adduction      Shoulder extension      Shoulder internal rotation 5  5   Shoulder external rotation 5  5   Middle trapezius 4+  4+   Lower trapezius 3+ 4- 4- 4-   (Blank rows = not tested)  LUMBAR ROM: tightness with lateral flexion  Active  A/PROM  eval  Flexion WFL  Extension WNL  Right lateral flexion WFL  Left lateral flexion WFL  Right rotation WNL  Left rotation WNL   (Blank rows = not tested)     TODAY'S TREATMENT:                                                                                                                              DATE:   12/06/2022 UBE x 6 min 3/3- PT present to discuss progress Lat pull down (seated) 70 lbs x 20 3 way scapular stabilization with yellow loop x 10 each side 4 D ball rolls with yellow plyo ball x 20 each on each UE Seated rows on matrix with 45 lbs x 20 Triceps Extension 30# 2 x 10 Shoulder flexion with green TB  with iso ER (looped on stair)  2 x 10 WITTY's with 2 lbs 2 x 10 each over RITFIT box (24" up) Standing Shoulder Flexion 3# 2 x 10 Standing Shoulder Abduction 3# 2 x 10 Bosu Ball (blue side down) standing on floor 2 x 45 sec (side to side rocks, front to back rocks then cw and ccw circles x 20 each)  12/04/2022 UBE x 6 min 3/3- PT present to discuss progress 3 way scapular stabilization with yellow loop x 10 each side 4 D ball rolls with yellow plyo ball x 20 each on each UE Lat pull down (seated) 70 lbs x 20 Seated rows on matrix with 45 lbs x 20 Triceps Extension 30# 2 x 10 Shoulder flexion with green TB  with iso ER (looped on stair) 2 x 10 WITTY's with 2 lbs x 8 each over corner of plinth Standing Shoulder Flexion 3# 2 x 10 Standing Shoulder Abduction 3# 2 x 10 Plank ups 2 x 8 Plank walks on plinth x 5 Bottoms up KB Press 5lb 2 x 10 Unilateral Farmer's Carries 20lb KB x 4 High Row & shoulder ER 1# DB 2 x 8 Bosu Ball (blue side down) ball balance 2 x 45 sec Muscle Testing FOTO assessment  11/22/22 UBE x 6 min 3/3- PT present to discuss progress 3 way scapular stabilization with yellow loop x 10 each side 4 D ball rolls with yellow plyo ball x 20 each on each UE Lat pull down (seated) 70 lbs x 20 Seated rows on matrix with 45 lbs x 20 Plank ups 2 x 5 Trigger Point Dry-Needling  Treatment instructions: Expect mild to moderate muscle soreness. S/S of pneumothorax if dry needled over a lung field, and to seek immediate medical attention should they occur. Patient verbalized understanding of these instructions and education. Patient Consent Given: Yes Education handout provided: Yes Muscles treated: bilateral upper traps and levator, rhomboids bilaterally Electrical stimulation performed: No Parameters: N/A Treatment response/outcome: Skilled palpation used to identify taut bands and trigger points.  Once identified, dry needling techniques used to treat these areas.  Twitch response ellicited along with  palpable elongation of muscle.  Following treatment, patient reports significant relief of neck pain.     PATIENT EDUCATION:  Education details: PT eval findings, anticipated POC, initial HEP, and postural awareness  Person educated: Patient Education method: Explanation, Demonstration, and Handouts Education comprehension: verbalized understanding and returned demonstration  HOME EXERCISE PROGRAM: Access Code: [email or web address] URL: [email or web address] Date: 11/06/2022 Prepared by: Claude Manges  Exercises - Seated Thoracic Lumbar Extension  - 1 x daily - 7 x weekly - 3 sets - 10 reps - Thoracic Extension Mobilization on Foam Roll  - 2 x daily - 7 x weekly - 2 sets - 5 reps - Sidelying Thoracic Rotation with Open Book  - 1 x daily - 7 x weekly - 2 sets - 5 reps - Child's Pose with Thread the Needle  - 1 x daily - 7 x weekly - 1 sets - 5 reps - 10-20 sec hold - Quadruped With Rotation: Thread The Needle  - 1 x daily - 7 x weekly - 1 sets - 10 reps - Prone Scapular Retraction Y  - 1 x daily - 7 x weekly - 1 sets - 10 reps - Seated Shoulder Y's  - 1 x daily - 7 x weekly - 1 sets - 10 reps - Seated Shoulder Abduction with Dumbbells - Thumbs Up  - 1 x daily - 7 x weekly - 1 sets -  10 reps  ASSESSMENT:  CLINICAL IMPRESSION: Jacob Stone is progressing appropriately.  He tolerates a fairly high level program without increased pain.  He continues to need reminders about consistent proper posture.  He seems to have sustained a left gastroc injury playing pickle ball today.  He is limping a bit upon entering the clinic.  Patient will benefit from skilled PT to address the below impairments and improve overall function.   OBJECTIVE IMPAIRMENTS: decreased activity tolerance, decreased ROM, decreased strength, hypomobility, increased muscle spasms, impaired flexibility, postural dysfunction, and pain.   ACTIVITY LIMITATIONS: carrying, lifting, and reaching out and up  PARTICIPATION LIMITATIONS:  community activity and occupation  PERSONAL FACTORS: Age and 3+ comorbidities: chronic LBP, DM, OA  are also affecting patient's functional outcome.   REHAB POTENTIAL: Excellent  CLINICAL DECISION MAKING: Stone/uncomplicated  EVALUATION COMPLEXITY: Low   GOALS: Goals reviewed with patient? Yes  SHORT TERM GOALS: Target date: 11/21/22  Ind with initial HEP Baseline:  Goal status: MET 11/22/22  2.  Decreased thoracic spine pain with activity by 40% Baseline:  Goal status: MET 11/22/22 (patient reports 50% better)   LONG TERM GOALS: Target date: 12/19/22  Ind with advanced HEP Baseline:  Goal status: IN PROGRESS 12/04/2022  2.  Decreased pain in thoracic spine with working out and ADLS by 80% or more to improve QOL. Baseline:  Goal status: IN PROGRESS 12/04/2022  3.  Improved thoracic spine mobility to WNL without pain provocation during spinal mobilizations. Baseline:  Goal status: IN PROGRESS 12/04/2022  4.  Improved mid/low trap and rhomboid strength to 4+/5. Baseline:  Goal status: IN PROGRESS 12/04/2022  5.  Improved FOTO to 63 showing functional improvement Baseline: 12/04/2022 : 50 Goal status: IN PROGRESS 12/04/2022  6.  Able to work out without increased thoracic spine pain Baseline:  Goal status: IN PROGRESS 12/04/2022   PLAN:  PT FREQUENCY: 1-2x/week  PT DURATION: 8 weeks  PLANNED INTERVENTIONS: Therapeutic exercises, Therapeutic activity, Neuromuscular re-education, Patient/Family education, Self Care, Joint mobilization, Dry Needling, Spinal mobilization, Cryotherapy, Moist heat, Taping, Traction, Ionotophoresis 4mg /ml Dexamethasone, and Manual therapy  PLAN FOR NEXT SESSION: Open book, thread the needle, doorway stretch, continue postural strength and stability along with thoracic mobility,  working over head shoulder stability; DN as needed     Victorino Dike B. Andie Mungin, PT 12/06/22 3:41 PM [facility] [address], [address] [place], [place] [address] Phone # [phone] Fax [phone]

## 2022-12-14 ENCOUNTER — Ambulatory Visit: Payer: Medicare Other | Attending: Adult Health

## 2022-12-14 DIAGNOSIS — R293 Abnormal posture: Secondary | ICD-10-CM

## 2022-12-14 DIAGNOSIS — R252 Cramp and spasm: Secondary | ICD-10-CM

## 2022-12-14 DIAGNOSIS — M6281 Muscle weakness (generalized): Secondary | ICD-10-CM

## 2022-12-14 DIAGNOSIS — M546 Pain in thoracic spine: Secondary | ICD-10-CM

## 2022-12-14 NOTE — Therapy (Signed)
OUTPATIENT PHYSICAL THERAPY THORACIC TREATMENT   Patient Name: Jacob Stone. MRN: [id number] DOB:15-Nov-1950, 72 y.o., male Today's Date: 12/14/2022  END OF SESSION:  PT End of Session - 12/14/22 1155     Visit Number 10    Number of Visits 12    Date for PT Re-Evaluation 01/06/23    Authorization Type UHC MCR    Authorization Time Period OPTUM Approved 8 visits-10/24/2022-12/19/2022 (more requested on 12/04/22)[employer] Approved 4 visits-12/04/2022-01/01/2023-    Authorization - Visit Number 10    Authorization - Number of Visits 12    Progress Note Due on Visit 20    PT Start Time 1145    PT Stop Time 1230    PT Time Calculation (min) 45 min    Activity Tolerance Patient tolerated treatment well    Behavior During Therapy WFL for tasks assessed/performed                  Past Medical History:  Diagnosis Date   Carotid artery calcification 03/2011   MILD HARD PLAQUE LEFT BULB, 0-39% BIL. ICA STENOSIS, PATENT VERTEBRAL ARTERIES WITH ANTEGRADE FLOW   Diabetes type 2, controlled (HCC)    Diverticulitis    Fatty liver    FH: kidney cancer 2005   right kidney removed    GERD (gastroesophageal reflux disease)    Gout    History of kidney stones    Left kidney   HTN (hypertension)    Hyperlipidemia    OA (osteoarthritis)    Knee   OSA (obstructive sleep apnea) 04/18/2016   SEVERE; UNABLE TO WEAR CPAP MORE THAN 2-3 HOURS PER NIGHT   Plantar fasciitis    Prostate cancer (HCC)    Seasonal allergies    Past Surgical History:  Procedure Laterality Date   COLON SURGERY  2011   Partial colon removed due to diverticulitis   COLONOSCOPY  08/2009   NEPHRECTOMY Right    PROSTATE BIOPSY     RADIOACTIVE SEED IMPLANT N/A 08/03/2017   Procedure: RADIOACTIVE SEED IMPLANT/BRACHYTHERAPY IMPLANT;  Surgeon: Ihor Gully, MD;  Location: [facility];  Service: Urology;  Laterality: N/A;   SPACE OAR INSTILLATION N/A 08/03/2017   Procedure: SPACE OAR INSTILLATION;   Surgeon: Ihor Gully, MD;  Location: [facility];  Service: Urology;  Laterality: N/A;   Patient Active Problem List   Diagnosis Date Noted   Trigger finger, left middle finger 06/02/2021   Right tennis elbow 05/19/2021   History of prostate cancer 09/23/2020   Low back pain 05/21/2019   Malignant neoplasm of prostate (HCC) 04/16/2017   Unilateral primary osteoarthritis, left knee 11/16/2016   OSA (obstructive sleep apnea) 04/18/2016   Sleep dysfunction with arousal disturbance 01/17/2016   Hyperlipidemia 09/14/2014   Diabetes 1.5, managed as type 2 (HCC) 07/07/2014   Acute gout 04/09/2014   Carotid artery calcification 03/02/2011   Malignant neoplasm of kidney excluding renal pelvis (HCC) 11/14/2007   Allergic rhinitis 11/14/2007   Diverticulosis of colon 04/16/2007   Essential hypertension 04/16/2007    PCP: Shirline Frees, NP   REFERRING PROVIDER: Shirline Frees, NP   REFERRING DIAG: T14.8XXA (ICD-10-CM) - Muscle strain  THERAPY DIAG:  Pain in thoracic spine  Cramp and spasm  Abnormal posture  Muscle weakness (generalized)  Rationale for Evaluation and Treatment: Rehabilitation  ONSET DATE: 1 month  SUBJECTIVE:  SUBJECTIVE STATEMENT: Patient reports he is still doing good with his upper back.  "It felt like it was going to go out on me the other day but it never did."  Pain is still 3/10.  Patient reports an injury today at the back of the left knee when playing pickle ball today.        Hand dominance: Right  PERTINENT HISTORY:  LBP, DM, HTN, OA  PAIN: 12/04/2022 Are you having pain? Yes: NPRS scale: 3/10 Pain location: more toward the left but mid throracic Pain description: jabbing, intermittent Aggravating factors: reaching, lifting and  picking up heavier items Relieving factors: change of position, muscle relaxors  PRECAUTIONS: None  RED FLAGS: None     WEIGHT BEARING RESTRICTIONS: No  FALLS:  Has patient fallen in last 6 months? 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   PLOF: Independent  PATIENT GOALS: Get rid of the pain  NEXT MD VISIT: none scheduled  OBJECTIVE:   DIAGNOSTIC FINDINGS:  N/A  PATIENT SURVEYS:  FOTO 50 (goal 49) 12/04/2022 : 50   COGNITION: Overall cognitive status: Within functional limits for tasks assessed  SENSATION: WFL  POSTURE: decreased lumbar lordosis  PALPATION: Painful along Tspine especially around T9/10 T5/6 Marked hypomobility with CPA and UPA mobs to thoracic spine     UPPER EXTREMITY ROM: WNL   UPPER EXTREMITY MMT: L Rhomboids 4/5 , R 5/5        Reassessment 12/04/2022: Lt rhomboids 4+/5 Rt rhomboids 5/5  MMT Right eval Reassessment 12/04/2022 Left eval Reassessment 12/04/2022  Shoulder flexion 4+  4+   Shoulder extension 5  5   Shoulder abduction 5  5   Shoulder adduction      Shoulder extension      Shoulder internal rotation 5  5   Shoulder external rotation 5  5   Middle trapezius 4+  4+   Lower trapezius 3+ 4- 4- 4-   (Blank rows = not tested)  LUMBAR ROM: tightness with lateral flexion  Active  A/PROM  eval  Flexion WFL  Extension WNL  Right lateral flexion WFL  Left lateral flexion WFL  Right rotation WNL  Left rotation WNL   (Blank rows = not tested)     TODAY'S TREATMENT:                                                                                                                              DATE:   12/14/2022 UBE x 6 min 3/3- PT present to discuss progress 3 way scapular stabilization with yellow loop x 10 each side 4 D ball rolls with yellow plyo ball x 20 each on each UE Lat pull down (seated) 70 lbs x 20 Seated rows on matrix  with 45 lbs x 20 Triceps Extension 30# 2 x 10 Quadruped bird dog x 20 Standing Shoulder Flexion 4# 2 x  10 Standing Shoulder Abduction 4# 2 x 10 Push up position shoulder taps x 20 Wing taps 2 x 10  12/06/2022 UBE x 6 min 3/3- PT present to discuss progress Lat pull down (seated) 70 lbs x 20 3 way scapular stabilization with yellow loop x 10 each side 4 D ball rolls with yellow plyo ball x 20 each on each UE Seated rows on matrix with 45 lbs x 20 Triceps Extension 30# 2 x 10 Shoulder flexion with green TB  with iso ER (looped on stair) 2 x 10 WITTY's with 2 lbs 2 x 10 each over RITFIT box (24" up) Standing Shoulder Flexion 3# 2 x 10 Standing Shoulder Abduction 3# 2 x 10 Bosu Ball (blue side down) standing on floor 2 x 45 sec (side to side rocks, front to back rocks then cw and ccw circles x 20 each)  12/04/2022 UBE x 6 min 3/3- PT present to discuss progress 3 way scapular stabilization with yellow loop x 10 each side 4 D ball rolls with yellow plyo ball x 20 each on each UE Lat pull down (seated) 70 lbs x 20 Seated rows on matrix with 45 lbs x 20 Triceps Extension 30# 2 x 10 Shoulder flexion with green TB  with iso ER (looped on stair) 2 x 10 WITTY's with 2 lbs x 8 each over corner of plinth Standing Shoulder Flexion 3# 2 x 10 Standing Shoulder Abduction 3# 2 x 10 Plank ups 2 x 8 Plank walks on plinth x 5 Bottoms up KB Press 5lb 2 x 10 Unilateral Farmer's Carries 20lb KB x 4 High Row & shoulder ER 1# DB 2 x 8 Bosu Ball (blue side down) ball balance 2 x 45 sec Muscle Testing FOTO assessment  11/22/22 UBE x 6 min 3/3- PT present to discuss progress 3 way scapular stabilization with yellow loop x 10 each side 4 D ball rolls with yellow plyo ball x 20 each on each UE Lat pull down (seated) 70 lbs x 20 Seated rows on matrix with 45 lbs x 20 Plank ups 2 x 5 Trigger Point Dry-Needling  Treatment instructions: Expect mild to moderate muscle soreness. S/S of pneumothorax if  dry needled over a lung field, and to seek immediate medical attention should they occur. Patient verbalized understanding of these instructions and education. Patient Consent Given: Yes Education handout provided: Yes Muscles treated: bilateral upper traps and levator, rhomboids bilaterally Electrical stimulation performed: No Parameters: N/A Treatment response/outcome: Skilled palpation used to identify taut bands and trigger points.  Once identified, dry needling techniques used to treat these areas.  Twitch response ellicited along with palpable elongation of muscle.  Following treatment, patient reports significant relief of neck pain.     PATIENT EDUCATION:  Education details: PT eval findings, anticipated POC, initial HEP, and postural awareness  Person educated: Patient Education method: Explanation, Demonstration, and Handouts Education comprehension: verbalized understanding and returned demonstration  HOME EXERCISE PROGRAM: Access Code: [email or web address] URL: [email or web address] Date: 11/06/2022 Prepared by: Claude Manges  Exercises - Seated Thoracic Lumbar Extension  - 1 x daily - 7 x weekly - 3 sets - 10 reps - Thoracic Extension Mobilization on Foam Roll  - 2 x daily - 7 x weekly - 2 sets - 5 reps - Sidelying Thoracic Rotation with Open Book  - 1 x daily - 7 x weekly - 2 sets - 5 reps - Child's Pose with Thread the Needle  - 1 x daily - 7 x weekly -  1 sets - 5 reps - 10-20 sec hold - Quadruped With Rotation: Thread The Needle  - 1 x daily - 7 x weekly - 1 sets - 10 reps - Prone Scapular Retraction Y  - 1 x daily - 7 x weekly - 1 sets - 10 reps - Seated Shoulder Y's  - 1 x daily - 7 x weekly - 1 sets - 10 reps - Seated Shoulder Abduction with Dumbbells - Thumbs Up  - 1 x daily - 7 x weekly - 1 sets - 10 reps  ASSESSMENT:  CLINICAL IMPRESSION: Annette Stable is progressing appropriately.  He tolerates a fairly high level program without increased pain.  He continues to need  reminders about consistent proper posture.  He seems to have sustained a left gastroc injury playing pickle ball today.  He is limping a bit upon entering the clinic.  Patient will benefit from skilled PT to address the below impairments and improve overall function.   OBJECTIVE IMPAIRMENTS: decreased activity tolerance, decreased ROM, decreased strength, hypomobility, increased muscle spasms, impaired flexibility, postural dysfunction, and pain.   ACTIVITY LIMITATIONS: carrying, lifting, and reaching out and up  PARTICIPATION LIMITATIONS: community activity and occupation  PERSONAL FACTORS: Age and 3+ comorbidities: chronic LBP, DM, OA  are also affecting patient's functional outcome.   REHAB POTENTIAL: Excellent  CLINICAL DECISION MAKING: Stable/uncomplicated  EVALUATION COMPLEXITY: Low   GOALS: Goals reviewed with patient? Yes  SHORT TERM GOALS: Target date: 11/21/22  Ind with initial HEP Baseline:  Goal status: MET 11/22/22  2.  Decreased thoracic spine pain with activity by 40% Baseline:  Goal status: MET 11/22/22 (patient reports 50% better)   LONG TERM GOALS: Target date: 12/19/22  Ind with advanced HEP Baseline:  Goal status: IN PROGRESS 12/04/2022  2.  Decreased pain in thoracic spine with working out and ADLS by 80% or more to improve QOL. Baseline:  Goal status: IN PROGRESS 12/04/2022  3.  Improved thoracic spine mobility to WNL without pain provocation during spinal mobilizations. Baseline:  Goal status: IN PROGRESS 12/04/2022  4.  Improved mid/low trap and rhomboid strength to 4+/5. Baseline:  Goal status: IN PROGRESS 12/04/2022  5.  Improved FOTO to 63 showing functional improvement Baseline: 12/04/2022 : 50 Goal status: IN PROGRESS 12/04/2022  6.  Able to work out without increased thoracic spine pain Baseline:  Goal status: IN PROGRESS 12/04/2022   PLAN:  PT FREQUENCY: 1-2x/week  PT DURATION: 8 weeks  PLANNED INTERVENTIONS: Therapeutic  exercises, Therapeutic activity, Neuromuscular re-education, Patient/Family education, Self Care, Joint mobilization, Dry Needling, Spinal mobilization, Cryotherapy, Moist heat, Taping, Traction, Ionotophoresis 4mg /ml Dexamethasone, and Manual therapy  PLAN FOR NEXT SESSION: Open book, thread the needle, doorway stretch, continue postural strength and stability along with thoracic mobility,  working over head shoulder stability; DN as needed     .[facility] [address], [address] [place], [place] [address] Phone # [phone] Fax [phone]

## 2022-12-19 ENCOUNTER — Ambulatory Visit: Payer: Medicare Other

## 2022-12-21 ENCOUNTER — Ambulatory Visit: Payer: Medicare Other

## 2022-12-21 DIAGNOSIS — M6281 Muscle weakness (generalized): Secondary | ICD-10-CM

## 2022-12-21 DIAGNOSIS — M546 Pain in thoracic spine: Secondary | ICD-10-CM

## 2022-12-21 DIAGNOSIS — R252 Cramp and spasm: Secondary | ICD-10-CM

## 2022-12-21 DIAGNOSIS — R293 Abnormal posture: Secondary | ICD-10-CM

## 2022-12-21 NOTE — Therapy (Signed)
OUTPATIENT PHYSICAL THERAPY THORACIC TREATMENT PHYSICAL THERAPY DISCHARGE SUMMARY  Visits from Start of Care: 11  Current functional level related to goals / functional outcomes: See below   Remaining deficits: See below   Education / Equipment: See below   Patient agrees to discharge. Patient goals were met. Patient is being discharged due to meeting the stated rehab goals.    Patient Name: Jacob Stone. MRN: [id number] DOB:09/02/1950, 72 y.o., male Today's Date: 12/22/2022  END OF SESSION:  PT End of Session - 12/21/22 1454     Visit Number 11    Number of Visits 12    Date for PT Re-Evaluation 01/06/23    Authorization Type UHC MCR    Authorization Time Period [employer] Approved 8 visits-10/24/2022-12/19/2022 (more requested on 12/04/22)[employer] Approved 4 visits-12/04/2022-01/01/2023-    Authorization - Visit Number 11    Authorization - Number of Visits 12    PT Start Time 1445    PT Stop Time 1525    PT Time Calculation (min) 40 min    Activity Tolerance Patient tolerated treatment well    Behavior During Therapy WFL for tasks assessed/performed                  Past Medical History:  Diagnosis Date   Carotid artery calcification 03/2011   MILD HARD PLAQUE LEFT BULB, 0-39% BIL. ICA STENOSIS, PATENT VERTEBRAL ARTERIES WITH ANTEGRADE FLOW   Diabetes type 2, controlled (HCC)    Diverticulitis    Fatty liver    FH: kidney cancer 2005   right kidney removed    GERD (gastroesophageal reflux disease)    Gout    History of kidney stones    Left kidney   HTN (hypertension)    Hyperlipidemia    OA (osteoarthritis)    Knee   OSA (obstructive sleep apnea) 04/18/2016   SEVERE; UNABLE TO WEAR CPAP MORE THAN 2-3 HOURS PER NIGHT   Plantar fasciitis    Prostate cancer (HCC)    Seasonal allergies    Past Surgical History:  Procedure Laterality Date   COLON SURGERY  2011   Partial colon removed due to diverticulitis   COLONOSCOPY  08/2009   NEPHRECTOMY Right     PROSTATE BIOPSY     RADIOACTIVE SEED IMPLANT N/A 08/03/2017   Procedure: RADIOACTIVE SEED IMPLANT/BRACHYTHERAPY IMPLANT;  Surgeon: Ihor Gully, MD;  Location: [facility];  Service: Urology;  Laterality: N/A;   SPACE OAR INSTILLATION N/A 08/03/2017   Procedure: SPACE OAR INSTILLATION;  Surgeon: Ihor Gully, MD;  Location: [facility];  Service: Urology;  Laterality: N/A;   Patient Active Problem List   Diagnosis Date Noted   Trigger finger, left middle finger 06/02/2021   Right tennis elbow 05/19/2021   History of prostate cancer 09/23/2020   Low back pain 05/21/2019   Malignant neoplasm of prostate (HCC) 04/16/2017   Unilateral primary osteoarthritis, left knee 11/16/2016   OSA (obstructive sleep apnea) 04/18/2016   Sleep dysfunction with arousal disturbance 01/17/2016   Hyperlipidemia 09/14/2014   Diabetes 1.5, managed as type 2 (HCC) 07/07/2014   Acute gout 04/09/2014   Carotid artery calcification 03/02/2011   Malignant neoplasm of kidney excluding renal pelvis (HCC) 11/14/2007   Allergic rhinitis 11/14/2007   Diverticulosis of colon 04/16/2007   Essential hypertension 04/16/2007    PCP: Shirline Frees, NP   REFERRING PROVIDER: Shirline Frees, NP   REFERRING DIAG: T14.8XXA (ICD-10-CM) - Muscle strain  THERAPY DIAG:  Pain in thoracic spine  Cramp and spasm  Abnormal posture  Muscle weakness (generalized)  Rationale for Evaluation and Treatment: Rehabilitation  ONSET DATE: 1 month  SUBJECTIVE:                                                                                                                                                                                                         SUBJECTIVE STATEMENT: Patient reports he is doing good.  Apologized for missing last visit.  Had his days mixed up.        Hand dominance: Right  PERTINENT HISTORY:  LBP, DM, HTN, OA  PAIN: 12/04/2022 Are you having pain? Yes: NPRS  scale: 0/10 Pain location: more toward the left but mid throracic Pain description: jabbing, intermittent Aggravating factors: reaching, lifting and picking up heavier items Relieving factors: change of position, muscle relaxors  PRECAUTIONS: None  RED FLAGS: None     WEIGHT BEARING RESTRICTIONS: No  FALLS:  Has patient fallen in last 6 months? 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   PLOF: Independent  PATIENT GOALS: Get rid of the pain  NEXT MD VISIT: none scheduled  OBJECTIVE:   DIAGNOSTIC FINDINGS:  N/A  PATIENT SURVEYS:  FOTO 50 (goal 63) 12/04/2022 : 50 12/21/22: 78   COGNITION: Overall cognitive status: Within functional limits for tasks assessed  SENSATION: WFL  POSTURE: decreased lumbar lordosis  PALPATION: Painful along Tspine especially around T9/10 T5/6 Marked hypomobility with CPA and UPA mobs to thoracic spine     UPPER EXTREMITY ROM: WNL   UPPER EXTREMITY MMT: L Rhomboids 4/5 , R 5/5        Reassessment 12/04/2022: Lt rhomboids 4+/5 Rt rhomboids 5/5  MMT Right eval Reassessment 12/04/2022 Left eval Reassessment 12/04/2022 DC 12/21/22  Shoulder flexion 4+  4+  5 bilat  Shoulder extension 5  5    Shoulder abduction 5  5  5  bilat  Shoulder adduction       Shoulder extension       Shoulder internal rotation 5  5  5  bilat  Shoulder external rotation 5  5  5  right, 4+ left  Middle trapezius 4+  4+  5 right, 4 left  Lower trapezius 3+ 4- 4- 4- 5 right, 4+ left   (Blank rows = not tested)  LUMBAR ROM: tightness with lateral flexion  Active  A/PROM  eval  Flexion WFL  Extension WNL  Right lateral flexion WFL  Left lateral flexion WFL  Right rotation WNL  Left  rotation WNL   (Blank rows = not tested)     TODAY'S TREATMENT:                                                                                                                               DATE:   12/21/2022 UBE x 6 min 3/3 PT present to discuss status Patient requested DC: "feeling better" Reviewed HEP, updated and new printouts provided DC assessment completed  12/14/2022 UBE x 6 min 3/3- PT present to discuss progress 3 way scapular stabilization with yellow loop x 10 each side 4 D ball rolls with yellow plyo ball x 20 each on each UE Lat pull down (seated) 70 lbs x 20 Seated rows on matrix with 45 lbs x 20 Triceps Extension 30# 2 x 10 Quadruped bird dog x 20 Standing Shoulder Flexion 4# 2 x 10 Standing Shoulder Abduction 4# 2 x 10 Push up position shoulder taps x 20 Wing taps 2 x 10  12/06/2022 UBE x 6 min 3/3- PT present to discuss progress Lat pull down (seated) 70 lbs x 20 3 way scapular stabilization with yellow loop x 10 each side 4 D ball rolls with yellow plyo ball x 20 each on each UE Seated rows on matrix with 45 lbs x 20 Triceps Extension 30# 2 x 10 Shoulder flexion with green TB  with iso ER (looped on stair) 2 x 10 WITTY's with 2 lbs 2 x 10 each over RITFIT box (24" up) Standing Shoulder Flexion 3# 2 x 10 Standing Shoulder Abduction 3# 2 x 10 Bosu Ball (blue side down) standing on floor 2 x 45 sec (side to side rocks, front to back rocks then cw and ccw circles x 20 each)  PATIENT EDUCATION:  Education details: PT eval findings, anticipated POC, initial HEP, and postural awareness  Person educated: Patient Education method: Explanation, Demonstration, and Handouts Education comprehension: verbalized understanding and returned demonstration  HOME EXERCISE PROGRAM: Access Code: [email or web address] URL: [email or web address] Date: 11/06/2022 Prepared by: Claude Manges  Exercises - Seated Thoracic Lumbar Extension  - 1 x daily - 7 x weekly - 3 sets - 10 reps - Thoracic Extension Mobilization on Foam Roll  - 2 x daily - 7 x weekly - 2 sets - 5 reps - Sidelying Thoracic Rotation with Open Book  - 1 x daily - 7 x weekly - 2 sets - 5  reps - Child's Pose with Thread the Needle  - 1 x daily - 7 x weekly - 1 sets - 5 reps - 10-20 sec hold - Quadruped With Rotation: Thread The Needle  - 1 x daily - 7 x weekly - 1 sets - 10 reps - Prone Scapular Retraction Y  - 1 x daily - 7 x weekly - 1 sets - 10 reps - Seated Shoulder Y's  - 1 x daily - 7 x weekly - 1 sets - 10 reps - Seated Shoulder Abduction with Dumbbells -  Thumbs Up  - 1 x daily - 7 x weekly - 1 sets - 10 reps  ASSESSMENT:  CLINICAL IMPRESSION: Jacob Stone has met all goals.  He is compliant and well motivated.  He needed minor vc's on thoracic rotatation stretch but was able to demonstrate all other exercises correctly.  He feels he has met his goals and would like to move fwd with discharge today.  His objective findings and FOTO scores are much improved.  He continues to have some minor weakness in left middle trap and rhomboids, but overall doing well.  He denies any pain in the past few weeks.  He is playing pickle ball daily.  He has resumed working out at his local fitness facility on days that he did not have PT.  He should continue to do well.  We will DC at this time.     OBJECTIVE IMPAIRMENTS: decreased activity tolerance, decreased ROM, decreased strength, hypomobility, increased muscle spasms, impaired flexibility, postural dysfunction, and pain.   ACTIVITY LIMITATIONS: carrying, lifting, and reaching out and up  PARTICIPATION LIMITATIONS: community activity and occupation  PERSONAL FACTORS: Age and 3+ comorbidities: chronic LBP, DM, OA  are also affecting patient's functional outcome.   REHAB POTENTIAL: Excellent  CLINICAL DECISION MAKING: Stone/uncomplicated  EVALUATION COMPLEXITY: Low   GOALS: Goals reviewed with patient? Yes  SHORT TERM GOALS: Target date: 11/21/22  Ind with initial HEP Baseline:  Goal status: MET 11/22/22  2.  Decreased thoracic spine pain with activity by 40% Baseline:  Goal status: MET 11/22/22 (patient reports 50%  better)   LONG TERM GOALS: Target date: 12/19/22  Ind with advanced HEP Baseline:  Goal status: MET 12/21/22  2.  Decreased pain in thoracic spine with working out and ADLS by 80% or more to improve QOL. Baseline:  Goal status:MET 12/21/22  3.  Improved thoracic spine mobility to WNL without pain provocation during spinal mobilizations. Baseline:  Goal status: MET 12/21/22  4.  Improved mid/low trap and rhomboid strength to 4+/5. Baseline:  Goal status: 4/5 12/21/22 on left, met on right  5.  Improved FOTO to 63 showing functional improvement Baseline: 12/04/2022 : 50 Goal status: MET 12/21/22 (77)  6.  Able to work out without increased thoracic spine pain Baseline:  Goal status: MET 12/21/22   PLAN:  PT FREQUENCY: 1-2x/week  PT DURATION: 8 weeks  PLANNED INTERVENTIONS: Therapeutic exercises, Therapeutic activity, Neuromuscular re-education, Patient/Family education, Self Care, Joint mobilization, Dry Needling, Spinal mobilization, Cryotherapy, Moist heat, Taping, Traction, Ionotophoresis 4mg /ml Dexamethasone, and Manual therapy  PLAN FOR NEXT SESSION: DC at this time.     Victorino Dike B. Autrey Human, PT 12/22/22 6:56 AM [name] [address], [address] [place], [place] [address] Phone # [phone] Fax [phone]

## 2022-12-26 ENCOUNTER — Other Ambulatory Visit: Payer: Self-pay | Admitting: Adult Health

## 2023-01-08 DIAGNOSIS — R350 Frequency of micturition: Secondary | ICD-10-CM | POA: Diagnosis not present

## 2023-03-05 ENCOUNTER — Telehealth: Payer: Self-pay | Admitting: Adult Health

## 2023-03-05 DIAGNOSIS — D225 Melanocytic nevi of trunk: Secondary | ICD-10-CM | POA: Diagnosis not present

## 2023-03-05 DIAGNOSIS — L905 Scar conditions and fibrosis of skin: Secondary | ICD-10-CM | POA: Diagnosis not present

## 2023-03-05 DIAGNOSIS — L821 Other seborrheic keratosis: Secondary | ICD-10-CM | POA: Diagnosis not present

## 2023-03-05 DIAGNOSIS — D485 Neoplasm of uncertain behavior of skin: Secondary | ICD-10-CM | POA: Diagnosis not present

## 2023-03-05 DIAGNOSIS — L570 Actinic keratosis: Secondary | ICD-10-CM | POA: Diagnosis not present

## 2023-03-05 DIAGNOSIS — Z85828 Personal history of other malignant neoplasm of skin: Secondary | ICD-10-CM | POA: Diagnosis not present

## 2023-03-05 DIAGNOSIS — Z974 Presence of external hearing-aid: Secondary | ICD-10-CM

## 2023-03-05 DIAGNOSIS — D2272 Melanocytic nevi of left lower limb, including hip: Secondary | ICD-10-CM | POA: Diagnosis not present

## 2023-03-05 DIAGNOSIS — D2262 Melanocytic nevi of left upper limb, including shoulder: Secondary | ICD-10-CM | POA: Diagnosis not present

## 2023-03-05 DIAGNOSIS — D2271 Melanocytic nevi of right lower limb, including hip: Secondary | ICD-10-CM | POA: Diagnosis not present

## 2023-03-05 DIAGNOSIS — C44519 Basal cell carcinoma of skin of other part of trunk: Secondary | ICD-10-CM | POA: Diagnosis not present

## 2023-03-05 NOTE — Telephone Encounter (Unsigned)
Copied from CRM [id number]. Topic: Referral - Request for Referral >> Mar 05, 2023 12:48 PM Lorretta Harp wrote: Did the patient discuss referral with their provider in the last year? Yes (If No - schedule appointment) (If Yes - send message)  Appointment offered? Yes  Type of order/referral and detailed reason for visit: patient is requesting a referral for a hearing screening at a location near [place], [place].  Preference of office, provider, location: Patient did not have a preference, as long as the office is in the same town of [place], [place]  If referral order, have you been seen by this specialty before? No (If Yes, this issue or another issue? When? Where?  Can we respond through MyChart? No

## 2023-03-06 NOTE — Telephone Encounter (Signed)
Please advise if pt needs an appt. Pt stated that this was discuss before.

## 2023-03-08 NOTE — Telephone Encounter (Signed)
Referral placed.

## 2023-03-13 ENCOUNTER — Encounter (HOSPITAL_BASED_OUTPATIENT_CLINIC_OR_DEPARTMENT_OTHER): Payer: Self-pay

## 2023-03-13 DIAGNOSIS — N135 Crossing vessel and stricture of ureter without hydronephrosis: Secondary | ICD-10-CM | POA: Diagnosis present

## 2023-03-13 DIAGNOSIS — E119 Type 2 diabetes mellitus without complications: Secondary | ICD-10-CM | POA: Diagnosis not present

## 2023-03-13 DIAGNOSIS — G4730 Sleep apnea, unspecified: Secondary | ICD-10-CM | POA: Insufficient documentation

## 2023-03-13 DIAGNOSIS — Z79899 Other long term (current) drug therapy: Secondary | ICD-10-CM | POA: Insufficient documentation

## 2023-03-13 DIAGNOSIS — I10 Essential (primary) hypertension: Secondary | ICD-10-CM | POA: Insufficient documentation

## 2023-03-13 DIAGNOSIS — Z7984 Long term (current) use of oral hypoglycemic drugs: Secondary | ICD-10-CM | POA: Diagnosis not present

## 2023-03-13 DIAGNOSIS — N179 Acute kidney failure, unspecified: Secondary | ICD-10-CM | POA: Insufficient documentation

## 2023-03-13 DIAGNOSIS — K219 Gastro-esophageal reflux disease without esophagitis: Secondary | ICD-10-CM | POA: Insufficient documentation

## 2023-03-13 DIAGNOSIS — N201 Calculus of ureter: Secondary | ICD-10-CM | POA: Diagnosis not present

## 2023-03-13 DIAGNOSIS — N132 Hydronephrosis with renal and ureteral calculous obstruction: Secondary | ICD-10-CM | POA: Insufficient documentation

## 2023-03-13 DIAGNOSIS — Z905 Acquired absence of kidney: Secondary | ICD-10-CM | POA: Insufficient documentation

## 2023-03-13 LAB — COMPREHENSIVE METABOLIC PANEL
ALT: 35 U/L (ref 0–44)
AST: 27 U/L (ref 15–41)
Albumin: 4.4 g/dL (ref 3.5–5.0)
Alkaline Phosphatase: 67 U/L (ref 38–126)
Anion gap: 8 (ref 5–15)
BUN: 24 mg/dL — ABNORMAL HIGH (ref 8–23)
CO2: 26 mmol/L (ref 22–32)
Calcium: 9.1 mg/dL (ref 8.9–10.3)
Chloride: 103 mmol/L (ref 98–111)
Creatinine, Ser: 1.86 mg/dL — ABNORMAL HIGH (ref 0.61–1.24)
GFR, Estimated: 37 mL/min — ABNORMAL LOW (ref >60)
Glucose, Bld: 188 mg/dL — ABNORMAL HIGH (ref 70–99)
Potassium: 4.1 mmol/L (ref 3.5–5.1)
Sodium: 137 mmol/L (ref 135–145)
Total Bilirubin: 0.7 mg/dL (ref 0.0–1.2)
Total Protein: 7.2 g/dL (ref 6.5–8.1)

## 2023-03-13 LAB — CBC
HCT: 44 % (ref 39.0–52.0)
Hemoglobin: 15.3 g/dL (ref 13.0–17.0)
MCH: 32.8 pg (ref 26.0–34.0)
MCHC: 34.8 g/dL (ref 30.0–36.0)
MCV: 94.2 fL (ref 80.0–100.0)
Platelets: 217 10*3/uL (ref 150–400)
RBC: 4.67 MIL/uL (ref 4.22–5.81)
RDW: 12.9 % (ref 11.5–15.5)
WBC: 6.3 10*3/uL (ref 4.0–10.5)
nRBC: 0 % (ref 0.0–0.2)

## 2023-03-13 LAB — LIPASE, BLOOD: Lipase: 35 U/L (ref 11–51)

## 2023-03-13 NOTE — ED Triage Notes (Signed)
Pt states that he has been having LLQ abd pain that radiates around the L flank area that started this afternoon. Denies urinary symptoms. Some nausea

## 2023-03-14 ENCOUNTER — Telehealth: Payer: Self-pay | Admitting: Urology

## 2023-03-14 ENCOUNTER — Emergency Department (HOSPITAL_COMMUNITY): Payer: Medicare Other | Admitting: Anesthesiology

## 2023-03-14 ENCOUNTER — Encounter (HOSPITAL_COMMUNITY): Payer: Self-pay

## 2023-03-14 ENCOUNTER — Ambulatory Visit: Payer: Self-pay | Admitting: Urology

## 2023-03-14 ENCOUNTER — Other Ambulatory Visit: Payer: Self-pay

## 2023-03-14 ENCOUNTER — Emergency Department (HOSPITAL_COMMUNITY): Payer: Medicare Other

## 2023-03-14 ENCOUNTER — Ambulatory Visit (HOSPITAL_BASED_OUTPATIENT_CLINIC_OR_DEPARTMENT_OTHER)
Admission: EM | Admit: 2023-03-14 | Discharge: 2023-03-14 | Disposition: A | Discharge status: Home / Self Care | Payer: Medicare Other | Attending: Emergency Medicine | Admitting: Emergency Medicine

## 2023-03-14 ENCOUNTER — Emergency Department (HOSPITAL_BASED_OUTPATIENT_CLINIC_OR_DEPARTMENT_OTHER): Payer: Medicare Other

## 2023-03-14 ENCOUNTER — Emergency Department (HOSPITAL_BASED_OUTPATIENT_CLINIC_OR_DEPARTMENT_OTHER): Payer: Medicare Other | Admitting: Anesthesiology

## 2023-03-14 ENCOUNTER — Encounter (HOSPITAL_COMMUNITY)
Admission: EM | Disposition: A | Discharge status: Home / Self Care | Payer: Self-pay | Source: Home / Self Care | Attending: Emergency Medicine

## 2023-03-14 DIAGNOSIS — N135 Crossing vessel and stricture of ureter without hydronephrosis: Secondary | ICD-10-CM | POA: Diagnosis present

## 2023-03-14 DIAGNOSIS — E119 Type 2 diabetes mellitus without complications: Secondary | ICD-10-CM | POA: Diagnosis not present

## 2023-03-14 DIAGNOSIS — Z7984 Long term (current) use of oral hypoglycemic drugs: Secondary | ICD-10-CM | POA: Diagnosis not present

## 2023-03-14 DIAGNOSIS — R109 Unspecified abdominal pain: Secondary | ICD-10-CM | POA: Diagnosis not present

## 2023-03-14 DIAGNOSIS — N201 Calculus of ureter: Secondary | ICD-10-CM

## 2023-03-14 DIAGNOSIS — N202 Calculus of kidney with calculus of ureter: Secondary | ICD-10-CM

## 2023-03-14 DIAGNOSIS — G4733 Obstructive sleep apnea (adult) (pediatric): Secondary | ICD-10-CM | POA: Diagnosis not present

## 2023-03-14 DIAGNOSIS — R1902 Left upper quadrant abdominal swelling, mass and lump: Secondary | ICD-10-CM | POA: Diagnosis not present

## 2023-03-14 DIAGNOSIS — Z905 Acquired absence of kidney: Secondary | ICD-10-CM | POA: Diagnosis not present

## 2023-03-14 DIAGNOSIS — Z79899 Other long term (current) drug therapy: Secondary | ICD-10-CM | POA: Diagnosis not present

## 2023-03-14 DIAGNOSIS — I10 Essential (primary) hypertension: Secondary | ICD-10-CM | POA: Diagnosis not present

## 2023-03-14 DIAGNOSIS — N132 Hydronephrosis with renal and ureteral calculous obstruction: Secondary | ICD-10-CM | POA: Diagnosis not present

## 2023-03-14 DIAGNOSIS — N139 Obstructive and reflux uropathy, unspecified: Secondary | ICD-10-CM | POA: Diagnosis not present

## 2023-03-14 DIAGNOSIS — K219 Gastro-esophageal reflux disease without esophagitis: Secondary | ICD-10-CM | POA: Diagnosis not present

## 2023-03-14 DIAGNOSIS — N179 Acute kidney failure, unspecified: Secondary | ICD-10-CM

## 2023-03-14 DIAGNOSIS — M545 Low back pain, unspecified: Secondary | ICD-10-CM | POA: Diagnosis not present

## 2023-03-14 DIAGNOSIS — G4730 Sleep apnea, unspecified: Secondary | ICD-10-CM | POA: Diagnosis not present

## 2023-03-14 HISTORY — PX: CYSTOSCOPY/RETROGRADE/URETEROSCOPY: SHX5316

## 2023-03-14 LAB — URINALYSIS, ROUTINE W REFLEX MICROSCOPIC
Bilirubin Urine: NEGATIVE
Glucose, UA: NEGATIVE mg/dL
Ketones, ur: NEGATIVE mg/dL
Leukocytes,Ua: NEGATIVE
Nitrite: NEGATIVE
Protein, ur: 30 mg/dL — AB
RBC / HPF: >50 RBC/hpf (ref 0–5)
Specific Gravity, Urine: 1.009 (ref 1.005–1.030)
pH: 5 (ref 5.0–8.0)

## 2023-03-14 LAB — BASIC METABOLIC PANEL
Anion gap: 11 (ref 5–15)
BUN: 23 mg/dL (ref 8–23)
CO2: 23 mmol/L (ref 22–32)
Calcium: 9.5 mg/dL (ref 8.9–10.3)
Chloride: 103 mmol/L (ref 98–111)
Creatinine, Ser: 1.83 mg/dL — ABNORMAL HIGH (ref 0.61–1.24)
GFR, Estimated: 38 mL/min — ABNORMAL LOW (ref >60)
Glucose, Bld: 196 mg/dL — ABNORMAL HIGH (ref 70–99)
Potassium: 4.1 mmol/L (ref 3.5–5.1)
Sodium: 137 mmol/L (ref 135–145)

## 2023-03-14 LAB — GLUCOSE, CAPILLARY
Glucose-Capillary: 137 mg/dL — ABNORMAL HIGH (ref 70–99)
Glucose-Capillary: 141 mg/dL — ABNORMAL HIGH (ref 70–99)

## 2023-03-14 SURGERY — CYSTOSCOPY/RETROGRADE/URETEROSCOPY
Anesthesia: General | Site: Ureter | Laterality: Left

## 2023-03-14 MED ORDER — FENTANYL CITRATE PF 50 MCG/ML IJ SOSY: 25.0000 ug | PREFILLED_SYRINGE | INTRAMUSCULAR | Status: DC | PRN

## 2023-03-14 MED ORDER — SODIUM CHLORIDE 0.9 % IR SOLN
Status: DC | PRN
  Administered 2023-03-14: 3000 mL

## 2023-03-14 MED ORDER — INSULIN ASPART 100 UNIT/ML IJ SOLN: 0.0000 [IU] | INTRAMUSCULAR | Status: DC | PRN

## 2023-03-14 MED ORDER — LACTATED RINGERS IV SOLN: INTRAVENOUS | Status: DC

## 2023-03-14 MED ORDER — DEXAMETHASONE SODIUM PHOSPHATE 10 MG/ML IJ SOLN
INTRAMUSCULAR | Status: DC | PRN
  Administered 2023-03-14: 5 mg via INTRAVENOUS

## 2023-03-14 MED ORDER — PROPOFOL 10 MG/ML IV BOLUS
INTRAVENOUS | Status: DC | PRN
  Administered 2023-03-14: 130 mg via INTRAVENOUS

## 2023-03-14 MED ORDER — HYDROCODONE-ACETAMINOPHEN 5-325 MG PO TABS: 1.0000 | ORAL_TABLET | Freq: Four times a day (QID) | ORAL | 0 refills | Status: DC | PRN

## 2023-03-14 MED ORDER — FENTANYL CITRATE (PF) 100 MCG/2ML IJ SOLN
INTRAMUSCULAR | Status: AC
  Filled 2023-03-14: qty 2

## 2023-03-14 MED ORDER — CEFDINIR 300 MG PO CAPS: 300.0000 mg | ORAL_CAPSULE | Freq: Two times a day (BID) | ORAL | 0 refills | Status: AC

## 2023-03-14 MED ORDER — FENTANYL CITRATE (PF) 100 MCG/2ML IJ SOLN
INTRAMUSCULAR | Status: DC | PRN
  Administered 2023-03-14 (×2): 25 ug via INTRAVENOUS

## 2023-03-14 MED ORDER — IOHEXOL 300 MG/ML  SOLN
INTRAMUSCULAR | Status: DC | PRN
  Administered 2023-03-14: 8 mL

## 2023-03-14 MED ORDER — LIDOCAINE HCL URETHRAL/MUCOSAL 2 % EX GEL
CUTANEOUS | Status: DC | PRN
  Administered 2023-03-14: 1 via URETHRAL

## 2023-03-14 MED ORDER — EPHEDRINE SULFATE-NACL 50-0.9 MG/10ML-% IV SOSY
PREFILLED_SYRINGE | INTRAVENOUS | Status: DC | PRN
  Administered 2023-03-14: 5 mg via INTRAVENOUS

## 2023-03-14 MED ORDER — EPHEDRINE 5 MG/ML INJ
INTRAVENOUS | Status: AC
  Filled 2023-03-14: qty 5

## 2023-03-14 MED ORDER — ONDANSETRON HCL 4 MG/2ML IJ SOLN: 4.0000 mg | Freq: Once | INTRAMUSCULAR | Status: DC | PRN

## 2023-03-14 MED ORDER — SODIUM CHLORIDE 0.9 % IV SOLN
1.0000 g | INTRAVENOUS | Status: AC
  Administered 2023-03-14: 1 g via INTRAVENOUS
  Filled 2023-03-14: qty 10

## 2023-03-14 MED ORDER — ACETAMINOPHEN 500 MG PO TABS
1000.0000 mg | ORAL_TABLET | Freq: Once | ORAL | Status: AC
  Administered 2023-03-14: 1000 mg via ORAL

## 2023-03-14 MED ORDER — ACETAMINOPHEN 500 MG PO TABS
ORAL_TABLET | ORAL | Status: AC
  Filled 2023-03-14: qty 2

## 2023-03-14 MED ORDER — LIDOCAINE 2% (20 MG/ML) 5 ML SYRINGE
INTRAMUSCULAR | Status: DC | PRN
  Administered 2023-03-14: 60 mg via INTRAVENOUS

## 2023-03-14 MED ORDER — LIDOCAINE HCL (PF) 2 % IJ SOLN
INTRAMUSCULAR | Status: AC
  Filled 2023-03-14: qty 5

## 2023-03-14 MED ORDER — LIDOCAINE HCL URETHRAL/MUCOSAL 2 % EX GEL
CUTANEOUS | Status: AC
  Filled 2023-03-14: qty 30

## 2023-03-14 MED ORDER — MORPHINE SULFATE (PF) 4 MG/ML IV SOLN
4.0000 mg | Freq: Once | INTRAVENOUS | Status: AC
  Administered 2023-03-14: 4 mg via INTRAVENOUS
  Filled 2023-03-14: qty 1

## 2023-03-14 MED ORDER — ONDANSETRON HCL 4 MG/2ML IJ SOLN
INTRAMUSCULAR | Status: DC | PRN
  Administered 2023-03-14: 5 mg via INTRAVENOUS

## 2023-03-14 MED ORDER — DEXAMETHASONE SODIUM PHOSPHATE 10 MG/ML IJ SOLN
INTRAMUSCULAR | Status: AC
  Filled 2023-03-14: qty 1

## 2023-03-14 MED ORDER — HYDROMORPHONE HCL 1 MG/ML IJ SOLN
0.5000 mg | Freq: Once | INTRAMUSCULAR | Status: DC
  Filled 2023-03-14: qty 1

## 2023-03-14 MED ORDER — ONDANSETRON HCL 4 MG/2ML IJ SOLN
4.0000 mg | Freq: Once | INTRAMUSCULAR | Status: AC
  Administered 2023-03-14: 4 mg via INTRAVENOUS
  Filled 2023-03-14: qty 2

## 2023-03-14 MED ORDER — CHLORHEXIDINE GLUCONATE 0.12 % MT SOLN
15.0000 mL | Freq: Once | OROMUCOSAL | Status: DC
Start: 2023-03-14 — End: 2023-03-14

## 2023-03-14 MED ORDER — PROPOFOL 10 MG/ML IV BOLUS
INTRAVENOUS | Status: AC
  Filled 2023-03-14: qty 20

## 2023-03-14 MED ORDER — HYDROMORPHONE HCL 1 MG/ML IJ SOLN
0.5000 mg | Freq: Once | INTRAMUSCULAR | Status: AC
  Administered 2023-03-14: 0.5 mg via INTRAVENOUS

## 2023-03-14 MED ORDER — ONDANSETRON HCL 4 MG/2ML IJ SOLN
INTRAMUSCULAR | Status: AC
  Filled 2023-03-14: qty 2

## 2023-03-14 SURGICAL SUPPLY — 15 items
BAG URO CATCHER STRL LF (MISCELLANEOUS) IMPLANT
BASKET ZERO TIP NITINOL 2.4FR (BASKET) IMPLANT
CATH URETERAL DUAL LUMEN 10F (MISCELLANEOUS) IMPLANT
CATH URETL OPEN END 6FR 70 (CATHETERS) IMPLANT
FIBER LASER MOSES 200 DFL (Laser) IMPLANT
GLOVE BIO SURGEON STRL SZ8.5 (GLOVE) IMPLANT
GUIDEWIRE STR DUAL SENSOR (WIRE) IMPLANT
KIT TURNOVER KIT A (KITS) IMPLANT
MANIFOLD NEPTUNE II (INSTRUMENTS) IMPLANT
PACK CYSTO (CUSTOM PROCEDURE TRAY) IMPLANT
SHEATH NAVIGATOR HD 11/13X28 (SHEATH) IMPLANT
SHEATH NAVIGATOR HD 11/13X36 (SHEATH) IMPLANT
STENT URET 6FRX28 CONTOUR (STENTS) IMPLANT
TUBING CONNECTING 10 (TUBING) IMPLANT
WATER STERILE IRR 1000ML POUR (IV SOLUTION) IMPLANT

## 2023-03-14 NOTE — ED Notes (Signed)
 Last meal at 4pm on 03/13/2023.  Report given to Alexia, RN  ED TO INPATIENT HANDOFF REPORT  ED Nurse Name and Phone #: Jon RAMAN Name/Age/Gender Jacob Stone [name]. 73 y.o. male Room/Bed: WA05/WA05  Code Status   Code Status: Not on file  Home/SNF/Other Home Patient oriented to: self, place, time, and situation Is this baseline? Yes   Triage Complete: Triage complete  Chief Complaint LT side pain  Triage Note Pt states that he has been having LLQ abd pain that radiates around the L flank area that started this afternoon. Denies urinary symptoms. Some nausea    Allergies Allergies  Allergen Reactions   Oxycodone Other (See Comments)    Patient stated he has nightmares with oxycodone    Level of Care/Admitting Diagnosis ED Disposition     ED Disposition  To OR/IR/Endo...   Condition  --   Comment  --         B Medical/Surgery History Past Medical History:  Diagnosis Date   Carotid artery calcification 03/2011   MILD HARD PLAQUE LEFT BULB, 0-39% BIL. ICA STENOSIS, PATENT VERTEBRAL ARTERIES WITH ANTEGRADE FLOW   Diabetes type 2, controlled (HCC)    Diverticulitis    Fatty liver    FH: kidney cancer 2005   right kidney removed    GERD (gastroesophageal reflux disease)    Gout    History of kidney stones    Left kidney   HTN (hypertension)    Hyperlipidemia    OA (osteoarthritis)    Knee   OSA (obstructive sleep apnea) 04/18/2016   SEVERE; UNABLE TO WEAR CPAP MORE THAN 2-3 HOURS PER NIGHT   Plantar fasciitis    Prostate cancer (HCC)    Seasonal allergies    Past Surgical History:  Procedure Laterality Date   COLON SURGERY  2011   Partial colon removed due to diverticulitis   COLONOSCOPY  08/2009   NEPHRECTOMY Right    PROSTATE BIOPSY     RADIOACTIVE SEED IMPLANT N/A 08/03/2017   Procedure: RADIOACTIVE SEED IMPLANT/BRACHYTHERAPY IMPLANT;  Surgeon: Ottelin, Mark, MD;  Location: [facility];  Service: Urology;  Laterality: N/A;    SPACE OAR INSTILLATION N/A 08/03/2017   Procedure: SPACE OAR INSTILLATION;  Surgeon: Ottelin, Mark, MD;  Location: [facility];  Service: Urology;  Laterality: N/A;     A IV Location/Drains/Wounds Patient Lines/Drains/Airways Status     Active Line/Drains/Airways     Name Placement date Placement time Site Days   Peripheral IV 03/14/23 20 G Anterior;Right Antecubital 03/14/23  0344  Antecubital  less than 1   Incision (Closed) 08/03/17 Perineum 08/03/17  0943  -- 2049            Intake/Output Last 24 hours No intake or output data in the 24 hours ending 03/14/23 0749  Labs/Imaging Results for orders placed or performed during the hospital encounter of 03/14/23 (from the past 48 hours)  Basic metabolic panel     Status: Abnormal   Collection Time: 03/13/23 10:33 PM  Result Value Ref Range   Sodium 137 135 - 145 mmol/L   Potassium 4.1 3.5 - 5.1 mmol/L   Chloride 103 98 - 111 mmol/L   CO2 23 22 - 32 mmol/L   Glucose, Bld 196 (H) 70 - 99 mg/dL    Comment: Glucose reference range applies only to samples taken after fasting for at least 8 hours.   BUN 23 8 - 23 mg/dL   Creatinine, Ser 8.16 (  H) 0.61 - 1.24 mg/dL   Calcium  9.5 8.9 - 10.3 mg/dL   GFR, Estimated 38 (L) >60 mL/min    Comment: (NOTE) Calculated using the CKD-EPI Creatinine Equation (2021)    Anion gap 11 5 - 15    Comment: Performed at [facility], [address], [place], [place] [address]  Lipase, blood     Status: None   Collection Time: 03/13/23 10:34 PM  Result Value Ref Range   Lipase 35 11 - 51 U/L    Comment: Performed at [facility], [address] [place], [place] [address]  Comprehensive metabolic panel     Status: Abnormal   Collection Time: 03/13/23 10:34 PM  Result Value Ref Range   Sodium 137 135 - 145 mmol/L   Potassium 4.1 3.5 - 5.1 mmol/L   Chloride 103 98 - 111 mmol/L   CO2 26 22 - 32 mmol/L   Glucose, Bld 188 (H) 70 - 99 mg/dL     Comment: Glucose reference range applies only to samples taken after fasting for at least 8 hours.   BUN 24 (H) 8 - 23 mg/dL   Creatinine, Ser 8.13 (H) 0.61 - 1.24 mg/dL   Calcium  9.1 8.9 - 10.3 mg/dL   Total Protein 7.2 6.5 - 8.1 g/dL   Albumin 4.4 3.5 - 5.0 g/dL   AST 27 15 - 41 U/L   ALT 35 0 - 44 U/L   Alkaline Phosphatase 67 38 - 126 U/L   Total Bilirubin 0.7 0.0 - 1.2 mg/dL   GFR, Estimated 37 (L) >60 mL/min    Comment: (NOTE) Calculated using the CKD-EPI Creatinine Equation (2021)    Anion gap 8 5 - 15    Comment: Performed at [facility], [address], [place], [place] [address]  CBC     Status: None   Collection Time: 03/13/23 10:34 PM  Result Value Ref Range   WBC 6.3 4.0 - 10.5 K/uL   RBC 4.67 4.22 - 5.81 MIL/uL   Hemoglobin 15.3 13.0 - 17.0 g/dL   HCT 55.9 60.9 - 47.9 %   MCV 94.2 80.0 - 100.0 fL   MCH 32.8 26.0 - 34.0 pg   MCHC 34.8 30.0 - 36.0 g/dL   RDW 87.0 88.4 - 84.4 %   Platelets 217 150 - 400 K/uL   nRBC 0.0 0.0 - 0.2 %    Comment: Performed at [facility], [address] [place], [place] [address]   CT L-SPINE NO CHARGE Result Date: 03/14/2023 CLINICAL DATA:  73 year old male with left flank and left lower quadrant pain. Nausea. Denies urinary symptoms. History of renal cell carcinoma, nephrectomy. EXAM: CT LUMBAR SPINE WITHOUT CONTRAST TECHNIQUE: Technique: Multiplanar CT images of the lumbar spine were reconstructed from contemporary CT of the Abdomen and Pelvis. RADIATION DOSE REDUCTION: This exam was performed according to the departmental dose-optimization program which includes automated exposure control, adjustment of the mA and/or kV according to patient size and/or use of iterative reconstruction technique. CONTRAST:  None COMPARISON:  Noncontrast CT Abdomen and Pelvis today reported separately. Lumbar MRI 06/27/2019. FINDINGS: Segmentation: Normal, the same numbering system used on the previous MRI.  Alignment: Stable lumbar lordosis. No significant scoliosis or spondylolisthesis. Vertebrae: Background bone mineralization is within normal limits. Maintained vertebral body height. No lytic or suspicious osseous lesion identified. Visible sacrum and SI joints appear intact. No acute osseous abnormality identified. Paraspinal and other soft tissues: Abdomen and pelvis are detailed separately today. Lumbar paraspinal  soft tissues are within normal limits. Disc levels: Mild for age lumbar disc bulging in general appears stable since the 2021 MRI. L4-L5 superimposed central to left paracentral disc extrusion appears probably regressed since that time. And otherwise the dominant lumbar degenerative finding is chronic facet arthropathy at L4-L5 and L5-S1, with facet hypertrophy and vacuum facet at both levels. No significant lumbar spinal stenosis by CT at this time. IMPRESSION: 1. No acute or suspicious finding by CT in the Lumbar Spine. 2. Moderate to severe chronic facet arthropathy at L4-L5 and L5-S1. 3. Abnormal CT Abdomen and Pelvis today are reported separately. Electronically Signed   By: VEAR Hurst M.D.   On: 03/14/2023 04:24   CT Renal Stone Study Result Date: 03/14/2023 CLINICAL DATA:  73 year old male with left flank and left lower quadrant pain. Nausea. Denies urinary symptoms. History of renal cell carcinoma, nephrectomy. EXAM: CT ABDOMEN AND PELVIS WITHOUT CONTRAST TECHNIQUE: Multidetector CT imaging of the abdomen and pelvis was performed following the standard protocol without IV contrast. RADIATION DOSE REDUCTION: This exam was performed according to the departmental dose-optimization program which includes automated exposure control, adjustment of the mA and/or kV according to patient size and/or use of iterative reconstruction technique. COMPARISON:  CT lumbar spine today reported separately. CT Abdomen 08/21/2013 and earlier. FINDINGS: Lower chest: Cardiac size is at the upper limits of normal. No  pericardial or pleural effusion. Minor lung base atelectasis. Hepatobiliary: Noncontrast liver and gallbladder appear stable since 2015. Steatosis at that time. Pancreas: Negative. Spleen: Negative. Adrenals/Urinary Tract: Chronic right nephrectomy. Right adrenal gland remains normal. Stable right nephrectomy surgical site. Left adrenal gland remains normal. Solitary left kidney with nonspecific pararenal stranding which has increased since 2015 (coronal image 46). Punctate left nephrolithiasis suspected on coronal image 47. And there is mild dilatation of the left renal collecting system and proximal left ureter. There is left periureteral stranding especially at the pelvic inlet. The left ureter is followed to series 2, image 66 and on image 69 there is a distal ureteral calculus in the pelvis measuring 3 x 4 mm (also on coronal image 41). Distal to that the left ureter is decompressed to the bladder. The calculus is about 3.5 cm proximal to the left ureterovesical junction. Bladder unremarkable. No other nephrolithiasis identified. Stomach/Bowel: Occasional diverticula in the distal colon including the descending segment on series 2, image 50. Some retained stool in those segments. No active inflammation identified. Upstream large bowel mild retained stool. Diminutive or absent appendix. Decompressed terminal ileum. No dilated small bowel. Decompressed stomach and duodenum. No free air or free fluid. Vascular/Lymphatic: Aortoiliac calcified atherosclerosis. Vascular patency is not evaluated in the absence of IV contrast. Normal caliber abdominal aorta. Chronic left upper quadrant mesenteric soft tissue mass measuring 2 x 3.3 cm on series 2, image 27, was suspicious for malignancy on previous exams including a 2010 PET-CT however, size appears stable since 2015. And surrounding mesenteric lymph nodes (same image) are stable or smaller since 2015, less numerous. No other lymphadenopathy identified. Reproductive:  Prostate brachytherapy. Other: No pelvis free fluid. Musculoskeletal: Lumbar spine detailed separately. No acute or suspicious osseous lesion identified. IMPRESSION: 1. Acute obstructive uropathy of solitary left kidney. Status post right nephrectomy years ago for RCC. Obstructing distal left ureteral calculus is 3 x 4 mm, about 3.5 cm upstream of the left UVJ. Additional punctate left nephrolithiasis. 2. Chronic 2 x 3 cm soft tissue mass in the left upper quadrant mesentery is stable since 2015, and surrounding mesenteric nodes  have regressed since that time, despite being characterized as probably malignant on PET-CT in 2010. No other evidence of metastatic disease on this noncontrast CT. 3. Lumbar spine CT reported separately. 4.  Aortic Atherosclerosis (ICD10-I70.0). Electronically Signed   By: VEAR Hurst M.D.   On: 03/14/2023 04:18    Pending Labs Unresulted Labs (From admission, onward)     Start     Ordered   03/14/23 0654  Urinalysis, Routine w reflex microscopic -Urine, Clean Catch  Once,   URGENT       Question:  Specimen Source  Answer:  Urine, Clean Catch   03/14/23 0653            Vitals/Pain Today's Vitals   03/14/23 0551 03/14/23 0600 03/14/23 0700 03/14/23 0701  BP:  132/74    Pulse:  75    Resp:  16    Temp:   97.7 F (36.5 C)   TempSrc:   Oral   SpO2:  96%    Weight:      Height:      PainSc: 8    5     Isolation Precautions No active isolations  Medications Medications  cefTRIAXone  (ROCEPHIN ) 1 g in sodium chloride  0.9 % 100 mL IVPB (has no administration in time range)  morphine  (PF) 4 MG/ML injection 4 mg (4 mg Intravenous Given 03/14/23 0350)  ondansetron  (ZOFRAN ) injection 4 mg (4 mg Intravenous Given 03/14/23 0348)  HYDROmorphone  (DILAUDID ) injection 0.5 mg (0.5 mg Intravenous Given 03/14/23 0607)    Mobility walks     Focused Assessments Renal Assessment Handoff:   See Scan and UA pending     R Recommendations: See Admitting Provider  Note  Report given to:   Additional Notes:  Cleaned with chlorhexidine  wipes and changed into gown.

## 2023-03-14 NOTE — ED Notes (Signed)
 Unable to located bladder scanner in department at this time. Urologist and Rancour MD notified. Pt attempting to urinate in urinal.

## 2023-03-14 NOTE — Anesthesia Preprocedure Evaluation (Addendum)
 Anesthesia Evaluation  Patient identified by MRN, date of birth, ID band Patient awake    Reviewed: Allergy & Precautions, NPO status , Patient's Chart, lab work & pertinent test results  History of Anesthesia Complications Negative for: history of anesthetic complications  Airway Mallampati: III  TM Distance: >3 FB Neck ROM: Full    Dental  (+) Dental Advisory Given   Pulmonary sleep apnea    Pulmonary exam normal        Cardiovascular hypertension, Pt. on medications Normal cardiovascular exam     Neuro/Psych negative neurological ROS  negative psych ROS   GI/Hepatic Neg liver ROS,GERD  Controlled,,  Endo/Other  diabetes, Type 2, Oral Hypoglycemic Agents    Renal/GU Renal InsufficiencyRenal disease S/p nephrectomy     Prostate cancer     Musculoskeletal  (+) Arthritis ,    Abdominal   Peds  Hematology negative hematology ROS (+)   Anesthesia Other Findings   Reproductive/Obstetrics                             Anesthesia Physical Anesthesia Plan  ASA: 3  Anesthesia Plan: General   Post-op Pain Management: Tylenol  PO (pre-op)*   Induction: Intravenous  PONV Risk Score and Plan: 2 and Treatment may vary due to age or medical condition and Ondansetron   Airway Management Planned: LMA  Additional Equipment: None  Intra-op Plan:   Post-operative Plan: Extubation in OR  Informed Consent: I have reviewed the patients History and Physical, chart, labs and discussed the procedure including the risks, benefits and alternatives for the proposed anesthesia with the patient or authorized representative who has indicated his/her understanding and acceptance.     Dental advisory given  Plan Discussed with: CRNA and Anesthesiologist  Anesthesia Plan Comments:         Anesthesia Quick Evaluation

## 2023-03-14 NOTE — ED Notes (Signed)
 Transport arranged for pt ED to [facility] ED via [facility]. Spoke with Rep Carolyn Cisco. Pt POC pending urology consult - accepting ED provider Dr.Rancour.

## 2023-03-14 NOTE — ED Provider Notes (Signed)
 [place] [facility] AT [facility] Provider Note   CSN: [id number] Arrival date & time: 03/13/23  2201     History  Chief Complaint  Patient presents with   Abdominal Pain    Jacob Stone. is a 73 y.o. male.  73 yo M with a chief complaints of left-sided abdominal pain.  Started more in the left lower quadrant and now feels like it is in his left flank.  He also has suddenly been unable to urinate.  No fevers no vomiting.  Nothing seems to make it better or worse.  Started earlier this evening.   Abdominal Pain      Home Medications Prior to Admission medications   Medication Sig Start Date End Date Taking? Authorizing Provider  allopurinol  (ZYLOPRIM ) 300 MG tablet TAKE 1 TABLET(300 MG) BY MOUTH DAILY 12/27/22   Nafziger, Darleene, NP  amLODipine  (NORVASC ) 10 MG tablet TAKE 1 TABLET(10 MG) BY MOUTH DAILY 05/22/22   Nafziger, Darleene, NP  aspirin 81 MG chewable tablet Chew by mouth.    [provider]  atorvastatin  (LIPITOR) 20 MG tablet Take 1 tablet (20 mg total) by mouth daily. 10/05/22   Nafziger, Darleene, NP  cetirizine (ZYRTEC) 10 MG tablet Take 10 mg by mouth daily.    [provider]  Cholecalciferol (VITAMIN D ) 125 MCG (5000 UT) CAPS Take by mouth daily.    [provider]  cyclobenzaprine  (FLEXERIL ) 10 MG tablet Take 1 tablet (10 mg total) by mouth 3 (three) times daily as needed for muscle spasms. Patient not taking: Reported on 10/03/2022 09/15/22   Merna Darleene, NP  Docusate Calcium  ([employer] STOOL SOFTENER PO) Take by mouth daily.    [provider]  losartan  (COZAAR ) 25 MG tablet TAKE 1 TABLET BY MOUTH DAILY 06/20/22   Nafziger, Darleene, NP  metFORMIN  (GLUCOPHAGE ) 500 MG tablet TAKE 1 TABLET BY MOUTH BEFORE BREAKFAST 11/21/22   Nafziger, Darleene, NP  Misc Natural Products (FIBER 7 PO) Take 2 tablets by mouth daily.     [provider]  Multiple Vitamins-Minerals (DAILY MULTI 50+ PO) Take 1 tablet by mouth daily.     [provider]  tamsulosin (FLOMAX) 0.4 MG CAPS capsule Take 0.4 mg by mouth daily.    [provider]      Allergies    Oxycodone    Review of Systems   Review of Systems  Gastrointestinal:  Positive for abdominal pain.    Physical Exam Updated Vital Signs BP 128/75   Pulse 78   Temp 97.9 F (36.6 C) (Oral)   Resp 18   Ht 5' 9 (1.753 m)   Wt 83.9 kg   SpO2 94%   BMI 27.32 kg/m  Physical Exam Vitals and nursing note reviewed.  Constitutional:      Appearance: He is well-developed.  HENT:     Head: Normocephalic and atraumatic.  Eyes:     Pupils: Pupils are equal, round, and reactive to light.  Neck:     Vascular: No JVD.  Cardiovascular:     Rate and Rhythm: Normal rate and regular rhythm.     Heart sounds: No murmur heard.    No friction rub. No gallop.  Pulmonary:     Effort: No respiratory distress.     Breath sounds: No wheezing.  Abdominal:     General: There is no distension.     Tenderness: There is no abdominal tenderness. There is no guarding or rebound.     Comments:  Patient points to his left CVA but does not have any obvious discomfort.  No pain to percussion.  Musculoskeletal:        General: Normal range of motion.     Cervical back: Normal range of motion and neck supple.  Skin:    Coloration: Skin is not pale.     Findings: No rash.  Neurological:     Mental Status: He is alert and oriented to person, place, and time.  Psychiatric:        Behavior: Behavior normal.     ED Results / Procedures / Treatments   Labs (all labs ordered are listed, but only abnormal results are displayed) Labs Reviewed  COMPREHENSIVE METABOLIC PANEL - Abnormal; Notable for the following components:      Result Value   Glucose, Bld 188 (*)    BUN 24 (*)    Creatinine, Ser 1.86 (*)    GFR, Estimated 37 (*)    All other components within normal limits  LIPASE, BLOOD  CBC  URINALYSIS, ROUTINE W REFLEX MICROSCOPIC  BASIC METABOLIC PANEL     EKG None  Radiology CT L-SPINE NO CHARGE Result Date: 03/14/2023 CLINICAL DATA:  73 year old male with left flank and left lower quadrant pain. Nausea. Denies urinary symptoms. History of renal cell carcinoma, nephrectomy. EXAM: CT LUMBAR SPINE WITHOUT CONTRAST TECHNIQUE: Technique: Multiplanar CT images of the lumbar spine were reconstructed from contemporary CT of the Abdomen and Pelvis. RADIATION DOSE REDUCTION: This exam was performed according to the departmental dose-optimization program which includes automated exposure control, adjustment of the mA and/or kV according to patient size and/or use of iterative reconstruction technique. CONTRAST:  None COMPARISON:  Noncontrast CT Abdomen and Pelvis today reported separately. Lumbar MRI 06/27/2019. FINDINGS: Segmentation: Normal, the same numbering system used on the previous MRI. Alignment: Stable lumbar lordosis. No significant scoliosis or spondylolisthesis. Vertebrae: Background bone mineralization is within normal limits. Maintained vertebral body height. No lytic or suspicious osseous lesion identified. Visible sacrum and SI joints appear intact. No acute osseous abnormality identified. Paraspinal and other soft tissues: Abdomen and pelvis are detailed separately today. Lumbar paraspinal soft tissues are within normal limits. Disc levels: Mild for age lumbar disc bulging in general appears stable since the 2021 MRI. L4-L5 superimposed central to left paracentral disc extrusion appears probably regressed since that time. And otherwise the dominant lumbar degenerative finding is chronic facet arthropathy at L4-L5 and L5-S1, with facet hypertrophy and vacuum facet at both levels. No significant lumbar spinal stenosis by CT at this time. IMPRESSION: 1. No acute or suspicious finding by CT in the Lumbar Spine. 2. Moderate to severe chronic facet arthropathy at L4-L5 and L5-S1. 3. Abnormal CT Abdomen and Pelvis today are reported separately.  Electronically Signed   By: VEAR Hurst M.D.   On: 03/14/2023 04:24   CT Renal Stone Study Result Date: 03/14/2023 CLINICAL DATA:  73 year old male with left flank and left lower quadrant pain. Nausea. Denies urinary symptoms. History of renal cell carcinoma, nephrectomy. EXAM: CT ABDOMEN AND PELVIS WITHOUT CONTRAST TECHNIQUE: Multidetector CT imaging of the abdomen and pelvis was performed following the standard protocol without IV contrast. RADIATION DOSE REDUCTION: This exam was performed according to the departmental dose-optimization program which includes automated exposure control, adjustment of the mA and/or kV according to patient size and/or use of iterative reconstruction technique. COMPARISON:  CT lumbar spine today reported separately. CT Abdomen 08/21/2013 and earlier. FINDINGS: Lower chest: Cardiac size is at the upper limits of normal.  No pericardial or pleural effusion. Minor lung base atelectasis. Hepatobiliary: Noncontrast liver and gallbladder appear stable since 2015. Steatosis at that time. Pancreas: Negative. Spleen: Negative. Adrenals/Urinary Tract: Chronic right nephrectomy. Right adrenal gland remains normal. Stable right nephrectomy surgical site. Left adrenal gland remains normal. Solitary left kidney with nonspecific pararenal stranding which has increased since 2015 (coronal image 46). Punctate left nephrolithiasis suspected on coronal image 47. And there is mild dilatation of the left renal collecting system and proximal left ureter. There is left periureteral stranding especially at the pelvic inlet. The left ureter is followed to series 2, image 66 and on image 69 there is a distal ureteral calculus in the pelvis measuring 3 x 4 mm (also on coronal image 41). Distal to that the left ureter is decompressed to the bladder. The calculus is about 3.5 cm proximal to the left ureterovesical junction. Bladder unremarkable. No other nephrolithiasis identified. Stomach/Bowel: Occasional  diverticula in the distal colon including the descending segment on series 2, image 50. Some retained stool in those segments. No active inflammation identified. Upstream large bowel mild retained stool. Diminutive or absent appendix. Decompressed terminal ileum. No dilated small bowel. Decompressed stomach and duodenum. No free air or free fluid. Vascular/Lymphatic: Aortoiliac calcified atherosclerosis. Vascular patency is not evaluated in the absence of IV contrast. Normal caliber abdominal aorta. Chronic left upper quadrant mesenteric soft tissue mass measuring 2 x 3.3 cm on series 2, image 27, was suspicious for malignancy on previous exams including a 2010 PET-CT however, size appears stable since 2015. And surrounding mesenteric lymph nodes (same image) are stable or smaller since 2015, less numerous. No other lymphadenopathy identified. Reproductive: Prostate brachytherapy. Other: No pelvis free fluid. Musculoskeletal: Lumbar spine detailed separately. No acute or suspicious osseous lesion identified. IMPRESSION: 1. Acute obstructive uropathy of solitary left kidney. Status post right nephrectomy years ago for RCC. Obstructing distal left ureteral calculus is 3 x 4 mm, about 3.5 cm upstream of the left UVJ. Additional punctate left nephrolithiasis. 2. Chronic 2 x 3 cm soft tissue mass in the left upper quadrant mesentery is stable since 2015, and surrounding mesenteric nodes have regressed since that time, despite being characterized as probably malignant on PET-CT in 2010. No other evidence of metastatic disease on this noncontrast CT. 3. Lumbar spine CT reported separately. 4.  Aortic Atherosclerosis (ICD10-I70.0). Electronically Signed   By: VEAR Hurst M.D.   On: 03/14/2023 04:18    Procedures .Critical Care  Performed by: Emil Share, DO Authorized by: Emil Share, DO   Critical care provider statement:    Critical care time (minutes):  35   Critical care time was exclusive of:  Separately billable  procedures and treating other patients   Critical care was time spent personally by me on the following activities:  Development of treatment plan with patient or surrogate, discussions with consultants, evaluation of patient's response to treatment, examination of patient, ordering and review of laboratory studies, ordering and review of radiographic studies, ordering and performing treatments and interventions, pulse oximetry, re-evaluation of patient's condition and review of old charts   Care discussed with: admitting provider       Medications Ordered in ED Medications  morphine  (PF) 4 MG/ML injection 4 mg (4 mg Intravenous Given 03/14/23 0350)  ondansetron  (ZOFRAN ) injection 4 mg (4 mg Intravenous Given 03/14/23 0348)    ED Course/ Medical Decision Making/ A&P  Medical Decision Making Amount and/or Complexity of Data Reviewed Labs: ordered. Radiology: ordered.  Risk Prescription drug management.   73 yo M with a chief complaints of left flank pain.  Going on for about 12 hours.  Patient looks a bit uncomfortable on initial exam.  No leukocytosis, mild bump in his renal function.  LFTs and lipase are unremarkable.  Will obtain CT imaging.  CT is concerning for a left UVJ stone.  Patient has a history of a right nephrectomy.  Discussed the case with urology recommends emergent transfer to [facility].  I discussed case with Dr. Carita  who accepts in transfer.  The patients results and plan were reviewed and discussed.   Any x-rays performed were independently reviewed by myself.   Differential diagnosis were considered with the presenting HPI.  Medications  morphine  (PF) 4 MG/ML injection 4 mg (4 mg Intravenous Given 03/14/23 0350)  ondansetron  (ZOFRAN ) injection 4 mg (4 mg Intravenous Given 03/14/23 0348)    Vitals:   03/13/23 2223 03/14/23 0330 03/14/23 0345 03/14/23 0501  BP:  (!) 130/94 132/80 128/75  Pulse:  74 77 78  Resp:  19  18  Temp:       TempSrc:      SpO2:  93% 96% 94%  Weight: 83.9 kg     Height: 5' 9 (1.753 m)       Final diagnoses:  Obstruction of left ureter             Final Clinical Impression(s) / ED Diagnoses Final diagnoses:  Obstruction of left ureter    Rx / DC Orders ED Discharge Orders     None         Emil Share, DO 03/14/23 0522

## 2023-03-14 NOTE — ED Provider Notes (Signed)
 Patient arrived from [facility].  Vital signs stable.  Discussed with Dr. Shona on arrival.  He is requesting bladder scan with catheterization of her medication is effective.  He is planning to take patient to the OR around 11am.   Will attempt to obtain urine sample.  Patient's vital signs remained stable. Dr. Shona planning to take patient to the OR on 11 AM.   Carita Senior, MD 03/14/23 [phone]

## 2023-03-14 NOTE — Telephone Encounter (Signed)
 LVM for pt to call back and sch

## 2023-03-14 NOTE — ED Notes (Signed)
 ED Provider at bedside.

## 2023-03-14 NOTE — Anesthesia Postprocedure Evaluation (Signed)
 Anesthesia Post Note  Patient: Jacob Stone.  Procedure(s) Performed: CYSTOSCOPY/RETROGRADE/URETEROSCOPY/LASER LITHOTRIPSY,URETERAL STENT PLACEMENT,LEFT (Left: Ureter)     Patient location during evaluation: PACU Anesthesia Type: General Level of consciousness: awake and alert Pain management: pain level controlled Vital Signs Assessment: post-procedure vital signs reviewed and stable Respiratory status: spontaneous breathing, nonlabored ventilation and respiratory function stable Cardiovascular status: stable and blood pressure returned to baseline Anesthetic complications: no   No notable events documented.  Last Vitals:  Vitals:   03/14/23 1000 03/14/23 1013  BP: 135/86 (!) 150/84  Pulse: 74   Resp: 19   Temp: 36.7 C 36.6 C  SpO2: 94%     Last Pain:  Vitals:   03/14/23 1013  TempSrc: Oral  PainSc:                  [name] Like

## 2023-03-14 NOTE — Anesthesia Procedure Notes (Addendum)
 Procedure Name: LMA Insertion Date/Time: 03/14/2023 8:36 AM  Performed by: Memory Armida LABOR, CRNAPre-anesthesia Checklist: Patient identified, Emergency Drugs available, Suction available, Patient being monitored and Timeout performed Patient Re-evaluated:Patient Re-evaluated prior to induction Oxygen Delivery Method: Circle system utilized Preoxygenation: Pre-oxygenation with 100% oxygen Induction Type: IV induction LMA: LMA with gastric port inserted LMA Size: 4.0 Number of attempts: 1 Placement Confirmation: positive ETCO2 and breath sounds checked- equal and bilateral Tube secured with: Tape Dental Injury: Teeth and Oropharynx as per pre-operative assessment

## 2023-03-14 NOTE — Progress Notes (Signed)
 CBG 137 ; Results not crossing over into epic

## 2023-03-14 NOTE — Consult Note (Signed)
 Urology Consult   Physician requesting consult: Dr. Emil  Reason for consult: left ureteral stone/solitary kidney  History of Present Illness: Jacob Stone. is a 73 y.o. well known to our practice followed by Dr. Selma.  Past GU hx of RCC s/p right rad Nx 2005-ned; low-volume unfavorable intermediate risk CaP s/p LDR+spaceOar 2018 with LUTS on tamsulosin.  Pmhx as below. Presented to [facility] ED with acute onset of left LQ and flank pain.  Lab studies remarkable for bump in Cr to 1.86 (BL= 1.22).  Patient has developed anuria. CT stone study shows a 4mm left distal ureteral stone in solitary unit with mild hydro.  Additional tiny left stones also noted.    Past Medical History:  Diagnosis Date   Carotid artery calcification 03/2011   MILD HARD PLAQUE LEFT BULB, 0-39% BIL. ICA STENOSIS, PATENT VERTEBRAL ARTERIES WITH ANTEGRADE FLOW   Diabetes type 2, controlled (HCC)    Diverticulitis    Fatty liver    FH: kidney cancer 2005   right kidney removed    GERD (gastroesophageal reflux disease)    Gout    History of kidney stones    Left kidney   HTN (hypertension)    Hyperlipidemia    OA (osteoarthritis)    Knee   OSA (obstructive sleep apnea) 04/18/2016   SEVERE; UNABLE TO WEAR CPAP MORE THAN 2-3 HOURS PER NIGHT   Plantar fasciitis    Prostate cancer (HCC)    Seasonal allergies     Past Surgical History:  Procedure Laterality Date   COLON SURGERY  2011   Partial colon removed due to diverticulitis   COLONOSCOPY  08/2009   NEPHRECTOMY Right    PROSTATE BIOPSY     RADIOACTIVE SEED IMPLANT N/A 08/03/2017   Procedure: RADIOACTIVE SEED IMPLANT/BRACHYTHERAPY IMPLANT;  Surgeon: Ottelin, Mark, MD;  Location: [facility] [place];  Service: Urology;  Laterality: N/A;   SPACE OAR INSTILLATION N/A 08/03/2017   Procedure: SPACE OAR INSTILLATION;  Surgeon: Ottelin, Mark, MD;  Location: [facility];  Service: Urology;  Laterality: N/A;    Medications:  Home  meds:    Scheduled Meds: Continuous Infusions: PRN Meds:.  Allergies:  Allergies  Allergen Reactions   Oxycodone Other (See Comments)    Patient stated he has nightmares with oxycodone    Family History  Problem Relation Age of Onset   Heart disease Mother        heart failure - 79 and 45 when she passed    Stroke Mother    Cancer Father        wagners   Glaucoma Father        wagoner's glaucoma   Hypertension Other        multiple family members    Social History:  reports that he has never smoked. He has never used smokeless tobacco. He reports current alcohol use of about 1.0 standard drink of alcohol per week. He reports that he does not use drugs.  ROS: A complete review of systems was performed.  All systems are negative except for pertinent findings as noted.  Physical Exam:  Vital signs in last 24 hours: Temp:  [97.9 F (36.6 C)] 97.9 F (36.6 C) (02/04 2222) Pulse Rate:  [74-78] 78 (02/05 0501) Resp:  [16-19] 18 (02/05 0501) BP: (128-149)/(75-107) 128/75 (02/05 0501) SpO2:  [93 %-98 %] 94 % (02/05 0501) Weight:  [83.9 kg] 83.9 kg (02/04 2223) Constitutional:  Alert and oriented, No acute distress Cardiovascular: Regular  rate  Respiratory: Normal respiratory effort, Lungs clear  GI: Abdomen is soft, nontender, nondistended, no abdominal masses Neurologic: Grossly intact, no focal deficits Psychiatric: Normal mood and affect  Laboratory Data:  Recent Labs    03/13/23 2234  WBC 6.3  HGB 15.3  HCT 44.0  PLT 217    Recent Labs    03/13/23 2234  NA 137  K 4.1  CL 103  GLUCOSE 188*  BUN 24*  CALCIUM  9.1  CREATININE 1.86*     Results for orders placed or performed during the hospital encounter of 03/14/23 (from the past 24 hours)  Lipase, blood     Status: None   Collection Time: 03/13/23 10:34 PM  Result Value Ref Range   Lipase 35 11 - 51 U/L  Comprehensive metabolic panel     Status: Abnormal   Collection Time: 03/13/23 10:34 PM   Result Value Ref Range   Sodium 137 135 - 145 mmol/L   Potassium 4.1 3.5 - 5.1 mmol/L   Chloride 103 98 - 111 mmol/L   CO2 26 22 - 32 mmol/L   Glucose, Bld 188 (H) 70 - 99 mg/dL   BUN 24 (H) 8 - 23 mg/dL   Creatinine, Ser 8.13 (H) 0.61 - 1.24 mg/dL   Calcium  9.1 8.9 - 10.3 mg/dL   Total Protein 7.2 6.5 - 8.1 g/dL   Albumin 4.4 3.5 - 5.0 g/dL   AST 27 15 - 41 U/L   ALT 35 0 - 44 U/L   Alkaline Phosphatase 67 38 - 126 U/L   Total Bilirubin 0.7 0.0 - 1.2 mg/dL   GFR, Estimated 37 (L) >60 mL/min   Anion gap 8 5 - 15  CBC     Status: None   Collection Time: 03/13/23 10:34 PM  Result Value Ref Range   WBC 6.3 4.0 - 10.5 K/uL   RBC 4.67 4.22 - 5.81 MIL/uL   Hemoglobin 15.3 13.0 - 17.0 g/dL   HCT 55.9 60.9 - 47.9 %   MCV 94.2 80.0 - 100.0 fL   MCH 32.8 26.0 - 34.0 pg   MCHC 34.8 30.0 - 36.0 g/dL   RDW 87.0 88.4 - 84.4 %   Platelets 217 150 - 400 K/uL   nRBC 0.0 0.0 - 0.2 %   No results found for this or any previous visit (from the past 240 hours).  Renal Function: Recent Labs    03/13/23 2234  CREATININE 1.86*   Estimated Creatinine Clearance: 34.3 mL/min (A) (by C-G formula based on SCr of 1.86 mg/dL (H)).  Radiologic Imaging: CT L-SPINE NO CHARGE Result Date: 03/14/2023 CLINICAL DATA:  73 year old male with left flank and left lower quadrant pain. Nausea. Denies urinary symptoms. History of renal cell carcinoma, nephrectomy. EXAM: CT LUMBAR SPINE WITHOUT CONTRAST TECHNIQUE: Technique: Multiplanar CT images of the lumbar spine were reconstructed from contemporary CT of the Abdomen and Pelvis. RADIATION DOSE REDUCTION: This exam was performed according to the departmental dose-optimization program which includes automated exposure control, adjustment of the mA and/or kV according to patient size and/or use of iterative reconstruction technique. CONTRAST:  None COMPARISON:  Noncontrast CT Abdomen and Pelvis today reported separately. Lumbar MRI 06/27/2019. FINDINGS: Segmentation:  Normal, the same numbering system used on the previous MRI. Alignment: Stable lumbar lordosis. No significant scoliosis or spondylolisthesis. Vertebrae: Background bone mineralization is within normal limits. Maintained vertebral body height. No lytic or suspicious osseous lesion identified. Visible sacrum and SI joints appear intact. No acute osseous abnormality identified.  Paraspinal and other soft tissues: Abdomen and pelvis are detailed separately today. Lumbar paraspinal soft tissues are within normal limits. Disc levels: Mild for age lumbar disc bulging in general appears stable since the 2021 MRI. L4-L5 superimposed central to left paracentral disc extrusion appears probably regressed since that time. And otherwise the dominant lumbar degenerative finding is chronic facet arthropathy at L4-L5 and L5-S1, with facet hypertrophy and vacuum facet at both levels. No significant lumbar spinal stenosis by CT at this time. IMPRESSION: 1. No acute or suspicious finding by CT in the Lumbar Spine. 2. Moderate to severe chronic facet arthropathy at L4-L5 and L5-S1. 3. Abnormal CT Abdomen and Pelvis today are reported separately. Electronically Signed   By: VEAR Stone M.D.   On: 03/14/2023 04:24   CT Renal Stone Study Result Date: 03/14/2023 CLINICAL DATA:  73 year old male with left flank and left lower quadrant pain. Nausea. Denies urinary symptoms. History of renal cell carcinoma, nephrectomy. EXAM: CT ABDOMEN AND PELVIS WITHOUT CONTRAST TECHNIQUE: Multidetector CT imaging of the abdomen and pelvis was performed following the standard protocol without IV contrast. RADIATION DOSE REDUCTION: This exam was performed according to the departmental dose-optimization program which includes automated exposure control, adjustment of the mA and/or kV according to patient size and/or use of iterative reconstruction technique. COMPARISON:  CT lumbar spine today reported separately. CT Abdomen 08/21/2013 and earlier. FINDINGS: Lower  chest: Cardiac size is at the upper limits of normal. No pericardial or pleural effusion. Minor lung base atelectasis. Hepatobiliary: Noncontrast liver and gallbladder appear stable since 2015. Steatosis at that time. Pancreas: Negative. Spleen: Negative. Adrenals/Urinary Tract: Chronic right nephrectomy. Right adrenal gland remains normal. Stable right nephrectomy surgical site. Left adrenal gland remains normal. Solitary left kidney with nonspecific pararenal stranding which has increased since 2015 (coronal image 46). Punctate left nephrolithiasis suspected on coronal image 47. And there is mild dilatation of the left renal collecting system and proximal left ureter. There is left periureteral stranding especially at the pelvic inlet. The left ureter is followed to series 2, image 66 and on image 69 there is a distal ureteral calculus in the pelvis measuring 3 x 4 mm (also on coronal image 41). Distal to that the left ureter is decompressed to the bladder. The calculus is about 3.5 cm proximal to the left ureterovesical junction. Bladder unremarkable. No other nephrolithiasis identified. Stomach/Bowel: Occasional diverticula in the distal colon including the descending segment on series 2, image 50. Some retained stool in those segments. No active inflammation identified. Upstream large bowel mild retained stool. Diminutive or absent appendix. Decompressed terminal ileum. No dilated small bowel. Decompressed stomach and duodenum. No free air or free fluid. Vascular/Lymphatic: Aortoiliac calcified atherosclerosis. Vascular patency is not evaluated in the absence of IV contrast. Normal caliber abdominal aorta. Chronic left upper quadrant mesenteric soft tissue mass measuring 2 x 3.3 cm on series 2, image 27, was suspicious for malignancy on previous exams including a 2010 PET-CT however, size appears stable since 2015. And surrounding mesenteric lymph nodes (same image) are stable or smaller since 2015, less  numerous. No other lymphadenopathy identified. Reproductive: Prostate brachytherapy. Other: No pelvis free fluid. Musculoskeletal: Lumbar spine detailed separately. No acute or suspicious osseous lesion identified. IMPRESSION: 1. Acute obstructive uropathy of solitary left kidney. Status post right nephrectomy years ago for RCC. Obstructing distal left ureteral calculus is 3 x 4 mm, about 3.5 cm upstream of the left UVJ. Additional punctate left nephrolithiasis. 2. Chronic 2 x 3 cm soft tissue mass  in the left upper quadrant mesentery is stable since 2015, and surrounding mesenteric nodes have regressed since that time, despite being characterized as probably malignant on PET-CT in 2010. No other evidence of metastatic disease on this noncontrast CT. 3. Lumbar spine CT reported separately. 4.  Aortic Atherosclerosis (ICD10-I70.0). Electronically Signed   By: VEAR Stone M.D.   On: 03/14/2023 04:18    I independently reviewed the above imaging studies.  Impression/Recommendation Solitary left kidney with 4mm left distal obstructing stone with AKI Today I had a long discussion with the patient regarding my recommendation for prompt management with USM/laser/stent.  Lysle of procedure reviewed in detail including potential adverse events and complications.  All questions answered.  Informed consent obtained.  Jacob Stone 03/14/2023, 5:18 AM

## 2023-03-14 NOTE — Interval H&P Note (Signed)
 History and Physical Interval Note:  03/14/2023 8:00 AM  Jacob Stone.  has presented today for surgery, with the diagnosis of OBSTRUCTING STONE.  The various methods of treatment have been discussed with the patient and family. After consideration of risks, benefits and other options for treatment, the patient has consented to  Procedure(s): CYSTOSCOPY/RETROGRADE/URETEROSCOPY/LASER LITHOTRIPSY (N/A) as a surgical intervention.  The patient's history has been reviewed, patient examined, no change in status, stable for surgery.  I have reviewed the patient's chart and labs.  Questions were answered to the patient's satisfaction.     Adine Manly

## 2023-03-14 NOTE — Telephone Encounter (Signed)
-----   Message from [facility] sent at 03/14/2023  9:32 AM EST ----- Please schedule post op appt for 1 week

## 2023-03-14 NOTE — Op Note (Signed)
 OPERATIVE NOTE   Patient Name: Jacob Stone.  MRN: [id number]   Date of Procedure: 03/14/23  Preoperative diagnosis:  Left ureteral calculus Solitary left kidney AKI  Postoperative diagnosis:  Left ureteral calculus Solitary left kidney AKI  Procedure:  Cystoscopy Left retrograde pyelogram with intra-operative interpretation Left ureteroscopic laser lithotripsy (stone obtained) Insertion of left ureteral stent (39F x 28 cm, with tether)  Attending: Adine DOROTHA Manly, MD  Anesthesia: General  Estimated blood loss: 5 ml  Fluids: Per anesthesia record  Drains: 39F x 28 cm left ureteral stent with tether  Specimens: stone fragments given to patient  Antibiotics: Rocephin  1 gm IV  Findings: Normal prostatic urethra; normal bladder; bloody efflux from left UO; 3 mm calculus in distal left ureter with proximal dilation  Indications:  Send a 73-year-old male with solitary left kidney following right radical nephrectomy in 2005 for renal cell carcinoma and history of unfavorable intermediate risk prostate cancer status post LDR presented to the emergency room this morning with acute onset of left lower quadrant and left flank pain.  He was found to have a increase in his creatinine to 1.86 from his baseline of 1.22.  CT stone study showed a 4 mm left distal ureteral calculus and a solitary kidney with mild left hydronephrosis.  The patient has had minimal urine output.  He presents now for cystoscopy, left retrograde pyelogram, left ureteroscopy, possible laser lithotripsy, and insertion of left ureteral stent.  The procedure including potential risk has been discussed with the patient in detail.  He understands and wishes to proceed as described.  Description of Procedure:  The patient was taken to the operating room suite and properly identified.  He was given IV antibiotics.  After successful induction of a general anesthetic, the patient was placed in the dorsolithotomy  position.  The patient's genital area was prepped and draped in sterile fashion.  A preoperative timeout was performed.  I attempted to pass a 21 French rigid cystoscope but met some resistance at the meatus.  The distal urethra was then gently dilated to 24 French using [employer].  Following dilation, the rigid cystoscope passed easily.  The patient was noted to have a fairly small prostate.  Upon entering the bladder, a normal-appearing trigone was seen with efflux of bloody urine from the left UO.  No mucosal lesions were seen throughout the bladder.  A left retrograde pyelogram was performed for evaluation of the patient's left upper tract given his history of ureteral calculus and AKI.  Scout film showed a nonspecific bowel gas pattern with no obvious bony abnormalities.  There appeared to be a 3-4 mm calcification in the left pelvis consistent with the known ureteral calculus.  Using a 5 French open-ended catheter, contrast was injected into the left ureter.  The previously noted calcification was confirmed to be within the left ureter.  There was proximal dilation extending up to the left renal pelvis.  The patient was noted to have a bifid collecting system.  No other filling defects were noted.  A sensor guidewire was passed through the open-ended catheter and into the left upper pole calyx.  The distal ureter was gently dilated using an 11 French access sheath.  Ureteroscopy was then performed alongside the guidewire into the distal ureter.  A 4 mm calculus was identified in the distal ureter.  Using the holmium laser fiber, the stone was fragmented into several smaller pieces.  A 0 tip nitinol basket was then used to  retrieve the pieces.  All visible stone fragments were removed.  Inspection of the ureter demonstrated no injury.  The ureteroscope was removed.  A 6 French by 28 cm double-J stent was then passed over the guidewire and into the upper pole calyx on the left.  A good curl was  noted proximally.  The tether was left attached and brought through the meatus.  The bladder was then drained and the cystoscope was removed.  Intraurethral lidocaine  jelly was placed.  The patient was then extubated and taken to the post anesthesia care unit in stable condition.  Complications: None  Condition: Stable, extubated, transferred to PACU  Plan:  Discharge to home Return to office in 1 week for stent removal

## 2023-03-14 NOTE — H&P (View-Only) (Signed)
 Urology Consult   Physician requesting consult: Dr. Emil  Reason for consult: left ureteral stone/solitary kidney  History of Present Illness: Jacob Gell. is a 73 y.o. well known to our practice followed by Dr. Selma.  Past GU hx of RCC s/p right rad Nx 2005-ned; low-volume unfavorable intermediate risk CaP s/p LDR+spaceOar 2018 with LUTS on tamsulosin.  Pmhx as below. Presented to [facility] ED with acute onset of left LQ and flank pain.  Lab studies remarkable for bump in Cr to 1.86 (BL= 1.22).  Patient has developed anuria. CT stone study shows a 4mm left distal ureteral stone in solitary unit with mild hydro.  Additional tiny left stones also noted.    Past Medical History:  Diagnosis Date   Carotid artery calcification 03/2011   MILD HARD PLAQUE LEFT BULB, 0-39% BIL. ICA STENOSIS, PATENT VERTEBRAL ARTERIES WITH ANTEGRADE FLOW   Diabetes type 2, controlled (HCC)    Diverticulitis    Fatty liver    FH: kidney cancer 2005   right kidney removed    GERD (gastroesophageal reflux disease)    Gout    History of kidney stones    Left kidney   HTN (hypertension)    Hyperlipidemia    OA (osteoarthritis)    Knee   OSA (obstructive sleep apnea) 04/18/2016   SEVERE; UNABLE TO WEAR CPAP MORE THAN 2-3 HOURS PER NIGHT   Plantar fasciitis    Prostate cancer (HCC)    Seasonal allergies     Past Surgical History:  Procedure Laterality Date   COLON SURGERY  2011   Partial colon removed due to diverticulitis   COLONOSCOPY  08/2009   NEPHRECTOMY Right    PROSTATE BIOPSY     RADIOACTIVE SEED IMPLANT N/A 08/03/2017   Procedure: RADIOACTIVE SEED IMPLANT/BRACHYTHERAPY IMPLANT;  Surgeon: Ottelin, Mark, MD;  Location: [facility] [place];  Service: Urology;  Laterality: N/A;   SPACE OAR INSTILLATION N/A 08/03/2017   Procedure: SPACE OAR INSTILLATION;  Surgeon: Ottelin, Mark, MD;  Location: [facility];  Service: Urology;  Laterality: N/A;    Medications:  Home  meds:    Scheduled Meds: Continuous Infusions: PRN Meds:.  Allergies:  Allergies  Allergen Reactions   Oxycodone Other (See Comments)    Patient stated he has nightmares with oxycodone    Family History  Problem Relation Age of Onset   Heart disease Mother        heart failure - 44 and 21 when she passed    Stroke Mother    Cancer Father        wagners   Glaucoma Father        wagoner's glaucoma   Hypertension Other        multiple family members    Social History:  reports that he has never smoked. He has never used smokeless tobacco. He reports current alcohol use of about 1.0 standard drink of alcohol per week. He reports that he does not use drugs.  ROS: A complete review of systems was performed.  All systems are negative except for pertinent findings as noted.  Physical Exam:  Vital signs in last 24 hours: Temp:  [97.9 F (36.6 C)] 97.9 F (36.6 C) (02/04 2222) Pulse Rate:  [74-78] 78 (02/05 0501) Resp:  [16-19] 18 (02/05 0501) BP: (128-149)/(75-107) 128/75 (02/05 0501) SpO2:  [93 %-98 %] 94 % (02/05 0501) Weight:  [83.9 kg] 83.9 kg (02/04 2223) Constitutional:  Alert and oriented, No acute distress Cardiovascular: Regular  rate  Respiratory: Normal respiratory effort, Lungs clear  GI: Abdomen is soft, nontender, nondistended, no abdominal masses Neurologic: Grossly intact, no focal deficits Psychiatric: Normal mood and affect  Laboratory Data:  Recent Labs    03/13/23 2234  WBC 6.3  HGB 15.3  HCT 44.0  PLT 217    Recent Labs    03/13/23 2234  NA 137  K 4.1  CL 103  GLUCOSE 188*  BUN 24*  CALCIUM  9.1  CREATININE 1.86*     Results for orders placed or performed during the hospital encounter of 03/14/23 (from the past 24 hours)  Lipase, blood     Status: None   Collection Time: 03/13/23 10:34 PM  Result Value Ref Range   Lipase 35 11 - 51 U/L  Comprehensive metabolic panel     Status: Abnormal   Collection Time: 03/13/23 10:34 PM   Result Value Ref Range   Sodium 137 135 - 145 mmol/L   Potassium 4.1 3.5 - 5.1 mmol/L   Chloride 103 98 - 111 mmol/L   CO2 26 22 - 32 mmol/L   Glucose, Bld 188 (H) 70 - 99 mg/dL   BUN 24 (H) 8 - 23 mg/dL   Creatinine, Ser 8.13 (H) 0.61 - 1.24 mg/dL   Calcium  9.1 8.9 - 10.3 mg/dL   Total Protein 7.2 6.5 - 8.1 g/dL   Albumin 4.4 3.5 - 5.0 g/dL   AST 27 15 - 41 U/L   ALT 35 0 - 44 U/L   Alkaline Phosphatase 67 38 - 126 U/L   Total Bilirubin 0.7 0.0 - 1.2 mg/dL   GFR, Estimated 37 (L) >60 mL/min   Anion gap 8 5 - 15  CBC     Status: None   Collection Time: 03/13/23 10:34 PM  Result Value Ref Range   WBC 6.3 4.0 - 10.5 K/uL   RBC 4.67 4.22 - 5.81 MIL/uL   Hemoglobin 15.3 13.0 - 17.0 g/dL   HCT 55.9 60.9 - 47.9 %   MCV 94.2 80.0 - 100.0 fL   MCH 32.8 26.0 - 34.0 pg   MCHC 34.8 30.0 - 36.0 g/dL   RDW 87.0 88.4 - 84.4 %   Platelets 217 150 - 400 K/uL   nRBC 0.0 0.0 - 0.2 %   No results found for this or any previous visit (from the past 240 hours).  Renal Function: Recent Labs    03/13/23 2234  CREATININE 1.86*   Estimated Creatinine Clearance: 34.3 mL/min (A) (by C-G formula based on SCr of 1.86 mg/dL (H)).  Radiologic Imaging: CT L-SPINE NO CHARGE Result Date: 03/14/2023 CLINICAL DATA:  73 year old male with left flank and left lower quadrant pain. Nausea. Denies urinary symptoms. History of renal cell carcinoma, nephrectomy. EXAM: CT LUMBAR SPINE WITHOUT CONTRAST TECHNIQUE: Technique: Multiplanar CT images of the lumbar spine were reconstructed from contemporary CT of the Abdomen and Pelvis. RADIATION DOSE REDUCTION: This exam was performed according to the departmental dose-optimization program which includes automated exposure control, adjustment of the mA and/or kV according to patient size and/or use of iterative reconstruction technique. CONTRAST:  None COMPARISON:  Noncontrast CT Abdomen and Pelvis today reported separately. Lumbar MRI 06/27/2019. FINDINGS: Segmentation:  Normal, the same numbering system used on the previous MRI. Alignment: Stable lumbar lordosis. No significant scoliosis or spondylolisthesis. Vertebrae: Background bone mineralization is within normal limits. Maintained vertebral body height. No lytic or suspicious osseous lesion identified. Visible sacrum and SI joints appear intact. No acute osseous abnormality identified.  Paraspinal and other soft tissues: Abdomen and pelvis are detailed separately today. Lumbar paraspinal soft tissues are within normal limits. Disc levels: Mild for age lumbar disc bulging in general appears stable since the 2021 MRI. L4-L5 superimposed central to left paracentral disc extrusion appears probably regressed since that time. And otherwise the dominant lumbar degenerative finding is chronic facet arthropathy at L4-L5 and L5-S1, with facet hypertrophy and vacuum facet at both levels. No significant lumbar spinal stenosis by CT at this time. IMPRESSION: 1. No acute or suspicious finding by CT in the Lumbar Spine. 2. Moderate to severe chronic facet arthropathy at L4-L5 and L5-S1. 3. Abnormal CT Abdomen and Pelvis today are reported separately. Electronically Signed   By: VEAR Stone M.D.   On: 03/14/2023 04:24   CT Renal Stone Study Result Date: 03/14/2023 CLINICAL DATA:  73 year old male with left flank and left lower quadrant pain. Nausea. Denies urinary symptoms. History of renal cell carcinoma, nephrectomy. EXAM: CT ABDOMEN AND PELVIS WITHOUT CONTRAST TECHNIQUE: Multidetector CT imaging of the abdomen and pelvis was performed following the standard protocol without IV contrast. RADIATION DOSE REDUCTION: This exam was performed according to the departmental dose-optimization program which includes automated exposure control, adjustment of the mA and/or kV according to patient size and/or use of iterative reconstruction technique. COMPARISON:  CT lumbar spine today reported separately. CT Abdomen 08/21/2013 and earlier. FINDINGS: Lower  chest: Cardiac size is at the upper limits of normal. No pericardial or pleural effusion. Minor lung base atelectasis. Hepatobiliary: Noncontrast liver and gallbladder appear stable since 2015. Steatosis at that time. Pancreas: Negative. Spleen: Negative. Adrenals/Urinary Tract: Chronic right nephrectomy. Right adrenal gland remains normal. Stable right nephrectomy surgical site. Left adrenal gland remains normal. Solitary left kidney with nonspecific pararenal stranding which has increased since 2015 (coronal image 46). Punctate left nephrolithiasis suspected on coronal image 47. And there is mild dilatation of the left renal collecting system and proximal left ureter. There is left periureteral stranding especially at the pelvic inlet. The left ureter is followed to series 2, image 66 and on image 69 there is a distal ureteral calculus in the pelvis measuring 3 x 4 mm (also on coronal image 41). Distal to that the left ureter is decompressed to the bladder. The calculus is about 3.5 cm proximal to the left ureterovesical junction. Bladder unremarkable. No other nephrolithiasis identified. Stomach/Bowel: Occasional diverticula in the distal colon including the descending segment on series 2, image 50. Some retained stool in those segments. No active inflammation identified. Upstream large bowel mild retained stool. Diminutive or absent appendix. Decompressed terminal ileum. No dilated small bowel. Decompressed stomach and duodenum. No free air or free fluid. Vascular/Lymphatic: Aortoiliac calcified atherosclerosis. Vascular patency is not evaluated in the absence of IV contrast. Normal caliber abdominal aorta. Chronic left upper quadrant mesenteric soft tissue mass measuring 2 x 3.3 cm on series 2, image 27, was suspicious for malignancy on previous exams including a 2010 PET-CT however, size appears stable since 2015. And surrounding mesenteric lymph nodes (same image) are stable or smaller since 2015, less  numerous. No other lymphadenopathy identified. Reproductive: Prostate brachytherapy. Other: No pelvis free fluid. Musculoskeletal: Lumbar spine detailed separately. No acute or suspicious osseous lesion identified. IMPRESSION: 1. Acute obstructive uropathy of solitary left kidney. Status post right nephrectomy years ago for RCC. Obstructing distal left ureteral calculus is 3 x 4 mm, about 3.5 cm upstream of the left UVJ. Additional punctate left nephrolithiasis. 2. Chronic 2 x 3 cm soft tissue mass  in the left upper quadrant mesentery is stable since 2015, and surrounding mesenteric nodes have regressed since that time, despite being characterized as probably malignant on PET-CT in 2010. No other evidence of metastatic disease on this noncontrast CT. 3. Lumbar spine CT reported separately. 4.  Aortic Atherosclerosis (ICD10-I70.0). Electronically Signed   By: VEAR Stone M.D.   On: 03/14/2023 04:18    I independently reviewed the above imaging studies.  Impression/Recommendation Solitary left kidney with 4mm left distal obstructing stone with AKI Today I had a long discussion with the patient regarding my recommendation for prompt management with USM/laser/stent.  Lysle of procedure reviewed in detail including potential adverse events and complications.  All questions answered.  Informed consent obtained.  Jacob Stone 03/14/2023, 5:18 AM

## 2023-03-14 NOTE — Transfer of Care (Signed)
 Immediate Anesthesia Transfer of Care Note  Patient: Jacob Stone.  Procedure(s) Performed: CYSTOSCOPY/RETROGRADE/URETEROSCOPY/LASER LITHOTRIPSY,URETERAL STENT PLACEMENT,LEFT (Left: Ureter)  Patient Location: PACU  Anesthesia Type:General  Level of Consciousness: awake, alert , oriented, and patient cooperative  Airway & Oxygen Therapy: Patient Spontanous Breathing and Patient connected to face mask oxygen  Post-op Assessment: Report given to RN, Post -op Vital signs reviewed and stable, and Patient moving all extremities  Post vital signs: Reviewed and stable  Last Vitals:  Vitals Value Taken Time  BP 144/83 03/14/23 0928  Temp    Pulse 82 03/14/23 0930  Resp 23 03/14/23 0930  SpO2 95 % 03/14/23 0930  Vitals shown include unfiled device data.  Last Pain:  Vitals:   03/14/23 0805  TempSrc: Oral  PainSc:          Complications: No notable events documented.

## 2023-03-15 ENCOUNTER — Telehealth: Payer: Self-pay | Admitting: Urology

## 2023-03-15 ENCOUNTER — Encounter (HOSPITAL_COMMUNITY): Payer: Self-pay | Admitting: Urology

## 2023-03-15 NOTE — Telephone Encounter (Signed)
 Pt is still passing blood through urine. Dark in the morning and light later on after emptying bladder. Noticed the string was gone this morning. Was told to make sure the string stays.  Stings when urinating as well.   Pt wants to know what he should do about the string.

## 2023-03-19 ENCOUNTER — Telehealth: Payer: Self-pay

## 2023-03-19 NOTE — Telephone Encounter (Signed)
 Copied from CRM [id number]. Topic: Referral - Status >> Mar 19, 2023 12:13 PM Barney Boozer wrote: Reason for CRM: Referral For Hearing Test >> Mar 19, 2023 12:15 PM Barney Boozer wrote: Patient states provider was supposed to be completing a referral for him to get a hearing test within the [facility], patient is wanting to know the updated status on that request. Patient call back number is [phone]

## 2023-03-20 NOTE — Telephone Encounter (Signed)
Pt notified that this was done. Pt advised me to call and lm with the name and number of the facility. I called pt and left detailed requested message.

## 2023-03-21 ENCOUNTER — Encounter: Payer: Self-pay | Admitting: Urology

## 2023-03-21 ENCOUNTER — Ambulatory Visit (INDEPENDENT_AMBULATORY_CARE_PROVIDER_SITE_OTHER): Payer: Medicare Other | Admitting: Urology

## 2023-03-21 VITALS — BP 146/77 | HR 78 | Ht 69.0 in | Wt 185.0 lb

## 2023-03-21 DIAGNOSIS — Z905 Acquired absence of kidney: Secondary | ICD-10-CM | POA: Diagnosis not present

## 2023-03-21 DIAGNOSIS — N201 Calculus of ureter: Secondary | ICD-10-CM

## 2023-03-21 LAB — MICROSCOPIC EXAMINATION: RBC, Urine: >30 /[HPF] — AB (ref 0–2)

## 2023-03-21 LAB — URINALYSIS, ROUTINE W REFLEX MICROSCOPIC
Bilirubin, UA: NEGATIVE
Ketones, UA: NEGATIVE
Nitrite, UA: NEGATIVE
Specific Gravity, UA: >1.03 — ABNORMAL HIGH (ref 1.005–1.030)
Urobilinogen, Ur: 0.2 mg/dL (ref 0.2–1.0)
pH, UA: 5.5 (ref 5.0–7.5)

## 2023-03-21 NOTE — Progress Notes (Addendum)
Assessment: 1. Left ureteral stone   2. Solitary kidney, acquired     Plan: Left ureteral stent removed today. BMP today Stone analysis sent. Stone prevention discussed with patient and information provided. Return to office in 1 month Will need continued follow-up with Dr. Cardell Peach as scheduled.  Chief Complaint: Chief Complaint  Patient presents with   Nephrolithiasis    HPI: Jacob Stone. is a 73 y.o. male who presents for continued evaluation of ureteral calculus. He has a solitary left kidney following right radical nephrectomy in 2005 for renal cell carcinoma and history of unfavorable intermediate risk prostate cancer status post LDR and presented to the emergency room on 03/14/23 with acute onset of left lower quadrant and left flank pain. He was found to have a increase in his creatinine to 1.86 from his baseline of 1.22. CT stone study showed a 4 mm left distal ureteral calculus and a solitary kidney with mild left hydronephrosis. The patient had minimal urine output.  He underwent cystoscopy, left retrograde pyelogram, left ureteroscopy, laser lithotripsy, and insertion of left ureteral stent on 03/14/23.  The stone was removed at the time. He presents today for stent removal. He is doing well following the procedure.  He is having some stent related urinary symptoms.  No pain, fever, or chills.  He has completed his antibiotic.  Portions of the above documentation were copied from a prior visit for review purposes only.  Allergies: Allergies  Allergen Reactions   Oxycodone Other (See Comments)    Patient stated he has nightmares with oxycodone    PMH: Past Medical History:  Diagnosis Date   Carotid artery calcification 03/2011   MILD HARD PLAQUE LEFT BULB, 0-39% BIL. ICA STENOSIS, PATENT VERTEBRAL ARTERIES WITH ANTEGRADE FLOW   Diabetes type 2, controlled (HCC)    Diverticulitis    Fatty liver    FH: kidney cancer 2005   right kidney removed    GERD  (gastroesophageal reflux disease)    Gout    History of kidney stones    Left kidney   HTN (hypertension)    Hyperlipidemia    OA (osteoarthritis)    Knee   OSA (obstructive sleep apnea) 04/18/2016   SEVERE; UNABLE TO WEAR CPAP MORE THAN 2-3 HOURS PER NIGHT   Plantar fasciitis    Prostate cancer (HCC)    Seasonal allergies     PSH: Past Surgical History:  Procedure Laterality Date   COLON SURGERY  2011   Partial colon removed due to diverticulitis   COLONOSCOPY  08/2009   CYSTOSCOPY/RETROGRADE/URETEROSCOPY Left 03/14/2023   Procedure: CYSTOSCOPY/RETROGRADE/URETEROSCOPY/LASER LITHOTRIPSY,URETERAL STENT PLACEMENT,LEFT;  Surgeon: Milderd Meager., MD;  Location: [facility];  Service: Urology;  Laterality: Left;   NEPHRECTOMY Right    PROSTATE BIOPSY     RADIOACTIVE SEED IMPLANT N/A 08/03/2017   Procedure: RADIOACTIVE SEED IMPLANT/BRACHYTHERAPY IMPLANT;  Surgeon: Ihor Gully, MD;  Location: [facility] [place];  Service: Urology;  Laterality: N/A;   SPACE OAR INSTILLATION N/A 08/03/2017   Procedure: SPACE OAR INSTILLATION;  Surgeon: Ihor Gully, MD;  Location: [facility];  Service: Urology;  Laterality: N/A;    SH: Social History   Tobacco Use   Smoking status: Never   Smokeless tobacco: Never  Vaping Use   Vaping status: Never Used  Substance Use Topics   Alcohol use: Yes    Alcohol/week: 1.0 standard drink of alcohol    Types: 1 Cans of beer per week    Comment: rare  Drug use: No    ROS: Constitutional:  Negative for fever, chills, weight loss CV: Negative for chest pain, previous MI, hypertension Respiratory:  Negative for shortness of breath, wheezing, sleep apnea, frequent cough GI:  Negative for nausea, vomiting, bloody stool, GERD  PE: BP (!) 146/77   Pulse 78   Ht 5\' 9"  (1.753 m)   Wt 185 lb (83.9 kg)   BMI 27.32 kg/m  GENERAL APPEARANCE:  Well appearing, well developed, well nourished, NAD HEENT:  Atraumatic, normocephalic,  oropharynx clear NECK:  Supple without lymphadenopathy or thyromegaly ABDOMEN:  Soft, non-tender, no masses EXTREMITIES:  Moves all extremities well, without clubbing, cyanosis, or edema NEUROLOGIC:  Alert and oriented x 3, normal gait, CN II-XII grossly intact MENTAL STATUS:  appropriate BACK:  Non-tender to palpation, No CVAT SKIN:  Warm, dry, and intact   Results: U/A: 6-10 WBC, >30 RBC, mod bacteria

## 2023-03-21 NOTE — Addendum Note (Signed)
Addended by: Carolin Coy on: 03/21/2023 09:31 AM   Modules accepted: Orders

## 2023-03-22 ENCOUNTER — Encounter: Payer: Self-pay | Admitting: Urology

## 2023-03-22 LAB — BASIC METABOLIC PANEL
BUN/Creatinine Ratio: 11 (ref 10–24)
BUN: 14 mg/dL (ref 8–27)
CO2: 22 mmol/L (ref 20–29)
Calcium: 9.1 mg/dL (ref 8.6–10.2)
Chloride: 103 mmol/L (ref 96–106)
Creatinine, Ser: 1.24 mg/dL (ref 0.76–1.27)
Glucose: 180 mg/dL — ABNORMAL HIGH (ref 70–99)
Potassium: 4.2 mmol/L (ref 3.5–5.2)
Sodium: 142 mmol/L (ref 134–144)
eGFR: 61 mL/min/{1.73_m2} (ref >59)

## 2023-03-26 ENCOUNTER — Encounter: Payer: Self-pay | Admitting: Urology

## 2023-03-27 ENCOUNTER — Encounter: Payer: Self-pay | Admitting: Urology

## 2023-04-18 ENCOUNTER — Ambulatory Visit (INDEPENDENT_AMBULATORY_CARE_PROVIDER_SITE_OTHER): Payer: Medicare Other | Admitting: Urology

## 2023-04-18 ENCOUNTER — Telehealth (HOSPITAL_BASED_OUTPATIENT_CLINIC_OR_DEPARTMENT_OTHER): Payer: Self-pay

## 2023-04-18 ENCOUNTER — Encounter: Payer: Self-pay | Admitting: Urology

## 2023-04-18 VITALS — BP 115/76 | HR 75 | Ht 69.0 in | Wt 185.0 lb

## 2023-04-18 DIAGNOSIS — C641 Malignant neoplasm of right kidney, except renal pelvis: Secondary | ICD-10-CM

## 2023-04-18 DIAGNOSIS — Z905 Acquired absence of kidney: Secondary | ICD-10-CM

## 2023-04-18 DIAGNOSIS — Z87442 Personal history of urinary calculi: Secondary | ICD-10-CM

## 2023-04-18 DIAGNOSIS — Z85528 Personal history of other malignant neoplasm of kidney: Secondary | ICD-10-CM

## 2023-04-18 DIAGNOSIS — Z09 Encounter for follow-up examination after completed treatment for conditions other than malignant neoplasm: Secondary | ICD-10-CM | POA: Diagnosis not present

## 2023-04-18 DIAGNOSIS — N201 Calculus of ureter: Secondary | ICD-10-CM

## 2023-04-18 DIAGNOSIS — Z8546 Personal history of malignant neoplasm of prostate: Secondary | ICD-10-CM | POA: Diagnosis not present

## 2023-04-18 LAB — URINALYSIS, ROUTINE W REFLEX MICROSCOPIC
Bilirubin, UA: NEGATIVE
Glucose, UA: NEGATIVE
Ketones, UA: NEGATIVE
Leukocytes,UA: NEGATIVE
Nitrite, UA: NEGATIVE
Specific Gravity, UA: 1.02 (ref 1.005–1.030)
Urobilinogen, Ur: 0.2 mg/dL (ref 0.2–1.0)
pH, UA: 6 (ref 5.0–7.5)

## 2023-04-18 LAB — MICROSCOPIC EXAMINATION: Bacteria, UA: NONE SEEN

## 2023-04-18 NOTE — Progress Notes (Signed)
 Assessment: 1. Left ureteral stone   2. Solitary kidney, acquired   3. History of prostate cancer   4. Malignant neoplasm of right kidney, except renal pelvis (HCC)     Plan: Continue stone prevention  Recommend renal ultrasound in 1 month. CXR per pt request Follow-up with Dr. Cardell Peach for history of right renal cell carcinoma and prostate cancer as previously scheduled  Chief Complaint: Chief Complaint  Patient presents with   Nephrolithiasis    HPI: Jacob Stone. is a 73 y.o. male who presents for continued evaluation of ureteral calculus. He has a solitary left kidney following right radical nephrectomy in 2005 for renal cell carcinoma and history of unfavorable intermediate risk prostate cancer status post LDR and presented to the emergency room on 03/14/23 with acute onset of left lower quadrant and left flank pain. He was found to have a increase in his creatinine to 1.86 from his baseline of 1.22. CT stone study showed a 4 mm left distal ureteral calculus and a solitary kidney with mild left hydronephrosis. The patient had minimal urine output.  He underwent cystoscopy, left retrograde pyelogram, left ureteroscopy, laser lithotripsy, and insertion of left ureteral stent on 03/14/23.  The stone was removed at the time. He did well following the procedure.  His stent was removed on 03/21/23. Cr improved to 1.24. Stone analysis:  99% calcium oxalate.  He returns today for follow-up.  He has done well following removal of his stent.  No lower urinary tract symptoms.  No flank pain.  No dysuria or gross hematuria.  Portions of the above documentation were copied from a prior visit for review purposes only.  Allergies: Allergies  Allergen Reactions   Oxycodone Other (See Comments)    Patient stated he has nightmares with oxycodone    PMH: Past Medical History:  Diagnosis Date   Carotid artery calcification 03/2011   MILD HARD PLAQUE LEFT BULB, 0-39% BIL. ICA STENOSIS,  PATENT VERTEBRAL ARTERIES WITH ANTEGRADE FLOW   Diabetes type 2, controlled (HCC)    Diverticulitis    Fatty liver    FH: kidney cancer 2005   right kidney removed    GERD (gastroesophageal reflux disease)    Gout    History of kidney stones    Left kidney   HTN (hypertension)    Hyperlipidemia    OA (osteoarthritis)    Knee   OSA (obstructive sleep apnea) 04/18/2016   SEVERE; UNABLE TO WEAR CPAP MORE THAN 2-3 HOURS PER NIGHT   Plantar fasciitis    Prostate cancer (HCC)    Seasonal allergies     PSH: Past Surgical History:  Procedure Laterality Date   COLON SURGERY  2011   Partial colon removed due to diverticulitis   COLONOSCOPY  08/2009   CYSTOSCOPY/RETROGRADE/URETEROSCOPY Left 03/14/2023   Procedure: CYSTOSCOPY/RETROGRADE/URETEROSCOPY/LASER LITHOTRIPSY,URETERAL STENT PLACEMENT,LEFT;  Surgeon: Milderd Meager., MD;  Location: [facility];  Service: Urology;  Laterality: Left;   NEPHRECTOMY Right    PROSTATE BIOPSY     RADIOACTIVE SEED IMPLANT N/A 08/03/2017   Procedure: RADIOACTIVE SEED IMPLANT/BRACHYTHERAPY IMPLANT;  Surgeon: Ihor Gully, MD;  Location: [facility] [place];  Service: Urology;  Laterality: N/A;   SPACE OAR INSTILLATION N/A 08/03/2017   Procedure: SPACE OAR INSTILLATION;  Surgeon: Ihor Gully, MD;  Location: [facility];  Service: Urology;  Laterality: N/A;    SH: Social History   Tobacco Use   Smoking status: Never   Smokeless tobacco: Never  Vaping Use  Vaping status: Never Used  Substance Use Topics   Alcohol use: Yes    Alcohol/week: 1.0 standard drink of alcohol    Types: 1 Cans of beer per week    Comment: rare   Drug use: No    ROS: Constitutional:  Negative for fever, chills, weight loss CV: Negative for chest pain, previous MI, hypertension Respiratory:  Negative for shortness of breath, wheezing, sleep apnea, frequent cough GI:  Negative for nausea, vomiting, bloody stool, GERD  PE: BP 115/76   Pulse 75    Ht 5\' 9"  (1.753 m)   Wt 185 lb (83.9 kg)   BMI 27.32 kg/m  GENERAL APPEARANCE:  Well appearing, well developed, well nourished, NAD HEENT:  Atraumatic, normocephalic, oropharynx clear NECK:  Supple without lymphadenopathy or thyromegaly ABDOMEN:  Soft, non-tender, no masses EXTREMITIES:  Moves all extremities well, without clubbing, cyanosis, or edema NEUROLOGIC:  Alert and oriented x 3, normal gait, CN II-XII grossly intact MENTAL STATUS:  appropriate BACK:  Non-tender to palpation, No CVAT SKIN:  Warm, dry, and intact   Results: U/A: 0-5 WBCs, 0-2 RBCs

## 2023-05-14 ENCOUNTER — Ambulatory Visit (HOSPITAL_BASED_OUTPATIENT_CLINIC_OR_DEPARTMENT_OTHER)
Admission: RE | Admit: 2023-05-14 | Discharge: 2023-05-14 | Disposition: A | Discharge status: Home / Self Care | Source: Ambulatory Visit | Attending: Urology | Admitting: Urology

## 2023-05-14 DIAGNOSIS — N201 Calculus of ureter: Secondary | ICD-10-CM | POA: Insufficient documentation

## 2023-05-14 DIAGNOSIS — Z905 Acquired absence of kidney: Secondary | ICD-10-CM | POA: Diagnosis not present

## 2023-05-15 ENCOUNTER — Other Ambulatory Visit: Payer: Self-pay | Admitting: Adult Health

## 2023-05-15 DIAGNOSIS — E139 Other specified diabetes mellitus without complications: Secondary | ICD-10-CM

## 2023-05-17 ENCOUNTER — Encounter: Payer: Self-pay | Admitting: Urology

## 2023-05-18 ENCOUNTER — Other Ambulatory Visit: Payer: Self-pay | Admitting: Adult Health

## 2023-05-18 DIAGNOSIS — E139 Other specified diabetes mellitus without complications: Secondary | ICD-10-CM

## 2023-06-01 ENCOUNTER — Other Ambulatory Visit: Payer: Self-pay | Admitting: Adult Health

## 2023-06-01 DIAGNOSIS — I10 Essential (primary) hypertension: Secondary | ICD-10-CM

## 2023-06-20 ENCOUNTER — Other Ambulatory Visit: Payer: Self-pay | Admitting: Adult Health

## 2023-07-06 ENCOUNTER — Other Ambulatory Visit: Payer: Self-pay | Admitting: Adult Health

## 2023-07-13 ENCOUNTER — Ambulatory Visit

## 2023-07-13 VITALS — BP 118/62 | HR 75 | Temp 98.5°F | Ht 69.0 in | Wt 187.0 lb

## 2023-07-13 DIAGNOSIS — Z0000 Encounter for general adult medical examination without abnormal findings: Secondary | ICD-10-CM | POA: Diagnosis not present

## 2023-07-13 NOTE — Patient Instructions (Addendum)
 [name] , Thank you for taking time out of your busy schedule to complete your Annual Wellness Visit with me. I enjoyed our conversation and look forward to speaking with you again next year. I, as well as your care team,  appreciate your ongoing commitment to your health goals. Please review the following plan we discussed and let me know if I can assist you in the future. Your Game plan/ To Do List    Referrals: If you haven't heard from the office you've been referred to, please reach out to them at the phone provided.   Follow up Visits: Next Medicare AWV with our clinical staff: 07/18/24 @ #p   Have you seen your provider in the last 6 months (3 months if uncontrolled diabetes)?  Next Office Visit with your provider:   Clinician Recommendations:  Aim for 30 minutes of exercise or brisk walking, 6-8 glasses of water, and 5 servings of fruits and vegetables each day.       This is a list of the screening recommended for you and due dates:  Health Maintenance  Topic Date Due   Complete foot exam   09/30/2022   COVID-19 Vaccine (5 - 2024-25 season) 10/08/2022   Hemoglobin A1C  04/05/2023   Eye exam for diabetics  07/18/2023   Flu Shot  09/07/2023   Yearly kidney health urinalysis for diabetes  10/03/2023   Yearly kidney function blood test for diabetes  03/20/2024   DTaP/Tdap/Td vaccine (3 - Tdap) 04/05/2024   Colon Cancer Screening  05/25/2024   Medicare Annual Wellness Visit  07/12/2024   Pneumonia Vaccine  Completed   Hepatitis C Screening  Completed   Zoster (Shingles) Vaccine  Completed   HPV Vaccine  Aged Out   Meningitis B Vaccine  Aged Out    Advanced directives: (Copy Requested) Please bring a copy of your health care power of attorney and living will to the office to be added to your chart at your convenience. You can mail to [facility] [address] [place], [place] [address] or email to [email or web address] Advance Care Planning is important  because it:  [x]  Makes sure you receive the medical care that is consistent with your values, goals, and preferences  [x]  It provides guidance to your family and loved ones and reduces their decisional burden about whether or not they are making the right decisions based on your wishes.  Follow the link provided in your after visit summary or read over the paperwork we have mailed to you to help you started getting your Advance Directives in place. If you need assistance in completing these, please reach out to us  so that we can help you!  See attachments for Preventive Care and Fall Prevention Tips.

## 2023-07-13 NOTE — Progress Notes (Signed)
 Subjective:   Jacob Askew. is a 73 y.o. who presents for a Medicare Wellness preventive visit.  As a reminder, Annual Wellness Visits don't include a physical exam, and some assessments may be limited, especially if this visit is performed virtually. We may recommend an in-person follow-up visit with your provider if needed.  Visit Complete: In person    Persons Participating in Visit: Patient.  AWV Questionnaire: Yes: Patient Medicare AWV questionnaire was completed by the patient on 07/10/23; I have confirmed that all information answered by patient is correct and no changes since this date.  Cardiac Risk Factors include: advanced age (>22men, >8 women);male gender;hypertension;diabetes mellitus     Objective:     Today's Vitals   07/13/23 1452  BP: 118/62  Pulse: 75  Temp: 98.5 F (36.9 C)  TempSrc: Oral  SpO2: 94%  Weight: 187 lb (84.8 kg)  Height: 5\' 9"  (1.753 m)   Body mass index is 27.62 kg/m.     07/13/2023    3:12 PM 03/14/2023    8:02 AM 03/13/2023   10:24 PM 10/24/2022    2:52 PM 06/21/2022    2:15 PM 06/16/2021   11:51 AM 08/04/2020    3:56 PM  Advanced Directives  Does Patient Have a Medical Advance Directive? Yes No No Yes Yes Yes Yes  Type of Estate agent of [place];Living will   Healthcare Power of [place];Living will Healthcare Power of [place];Living will Healthcare Power of [place];Living will   Does patient want to make changes to medical advance directive?    No - Patient declined  No - Patient declined No - Patient declined  Copy of Healthcare Power of Attorney in Chart? No - copy requested   No - copy requested No - copy requested No - copy requested   Would patient like information on creating a medical advance directive?  No - Patient declined         Current Medications (verified) Outpatient Encounter Medications as of 07/13/2023  Medication Sig   allopurinol  (ZYLOPRIM ) 300 MG tablet TAKE 1 TABLET(300 MG) BY MOUTH  DAILY   amLODipine  (NORVASC ) 10 MG tablet TAKE 1 TABLET(10 MG) BY MOUTH DAILY   aspirin 81 MG chewable tablet Chew by mouth.   atorvastatin  (LIPITOR) 20 MG tablet TAKE 1 TABLET(20 MG) BY MOUTH DAILY   cetirizine (ZYRTEC) 10 MG tablet Take 10 mg by mouth daily.   Cholecalciferol (VITAMIN D ) 125 MCG (5000 UT) CAPS Take by mouth daily.   Docusate Calcium  ([employer] STOOL SOFTENER PO) Take by mouth daily.   losartan  (COZAAR ) 25 MG tablet TAKE 1 TABLET BY MOUTH DAILY   metFORMIN  (GLUCOPHAGE ) 500 MG tablet TAKE 1 TABLET BY MOUTH BEFORE BREAKFAST   Misc Natural Products (FIBER 7 PO) Take 2 tablets by mouth daily.    Multiple Vitamins-Minerals (DAILY MULTI 50+ PO) Take 1 tablet by mouth daily.   tamsulosin (FLOMAX) 0.4 MG CAPS capsule Take 0.4 mg by mouth daily.   No facility-administered encounter medications on file as of 07/13/2023.    Allergies (verified) Oxycodone   History: Past Medical History:  Diagnosis Date   Carotid artery calcification 03/2011   MILD HARD PLAQUE LEFT BULB, 0-39% BIL. ICA STENOSIS, PATENT VERTEBRAL ARTERIES WITH ANTEGRADE FLOW   Diabetes type 2, controlled (HCC)    Diverticulitis    Fatty liver    FH: kidney cancer 2005   right kidney removed    GERD (gastroesophageal reflux disease)    Gout  History of kidney stones    Left kidney   HTN (hypertension)    Hyperlipidemia    OA (osteoarthritis)    Knee   OSA (obstructive sleep apnea) 04/18/2016   SEVERE; UNABLE TO WEAR CPAP MORE THAN 2-3 HOURS PER NIGHT   Plantar fasciitis    Prostate cancer (HCC)    Seasonal allergies    Past Surgical History:  Procedure Laterality Date   COLON SURGERY  2011   Partial colon removed due to diverticulitis   COLONOSCOPY  08/2009   CYSTOSCOPY/RETROGRADE/URETEROSCOPY Left 03/14/2023   Procedure: CYSTOSCOPY/RETROGRADE/URETEROSCOPY/LASER LITHOTRIPSY,URETERAL STENT PLACEMENT,LEFT;  Surgeon: Mellie Sprinkle., MD;  Location: [facility];  Service: Urology;  Laterality: Left;    NEPHRECTOMY Right    PROSTATE BIOPSY     RADIOACTIVE SEED IMPLANT N/A 08/03/2017   Procedure: RADIOACTIVE SEED IMPLANT/BRACHYTHERAPY IMPLANT;  Surgeon: Ottelin, Mark, MD;  Location: [facility] [place];  Service: Urology;  Laterality: N/A;   SPACE OAR INSTILLATION N/A 08/03/2017   Procedure: SPACE OAR INSTILLATION;  Surgeon: Ottelin, Mark, MD;  Location: [facility];  Service: Urology;  Laterality: N/A;   Family History  Problem Relation Age of Onset   Heart disease Mother        heart failure - 80 and 82 when she passed    Stroke Mother    Cancer Father        wagners   Glaucoma Father        wagoner's glaucoma   Hypertension Other        multiple family members   Social History   Socioeconomic History   Marital status: Married    Spouse name: Not on file   Number of children: Not on file   Years of education: Not on file   Highest education level: Bachelor's degree (e.g., BA, AB, BS)  Occupational History   Occupation: retired    Comment: Designer, industrial/product   Occupation: part time    Comment: golf course  Tobacco Use   Smoking status: Never   Smokeless tobacco: Never  Vaping Use   Vaping status: Never Used  Substance and Sexual Activity   Alcohol use: Yes    Alcohol/week: 1.0 standard drink of alcohol    Types: 1 Cans of beer per week    Comment: rare   Drug use: No   Sexual activity: Yes  Other Topics Concern   Not on file  Social History Narrative   HH 2   Married wife with compromised immune system   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 - > 5 miles per day   Social Drivers of Corporate investment banker Strain: Low Risk  (07/13/2023)   Overall Financial Resource Strain (CARDIA)    Difficulty of Paying Living Expenses: Not very hard  Food Insecurity: No Food Insecurity (07/13/2023)   Hunger Vital Sign    Worried About Running Out of Food in the Last Year: Never true    Ran Out of Food in the Last Year:  Never true  Transportation Needs: No Transportation Needs (07/13/2023)   PRAPARE - Administrator, Civil Service (Medical): No    Lack of Transportation (Non-Medical): No  Physical Activity: Sufficiently Active (07/13/2023)   Exercise Vital Sign    Days of Exercise per Week: 3 days    Minutes of Exercise per Session: 50 min  Stress: No Stress Concern Present (07/13/2023)   [employer] of Occupational Health - Occupational Stress  Questionnaire    Feeling of Stress : Only a little  Social Connections: Moderately Integrated (07/13/2023)   Social Connection and Isolation Panel [NHANES]    Frequency of Communication with Friends and Family: Twice a week    Frequency of Social Gatherings with Friends and Family: Once a week    Attends Religious Services: Never    Database administrator or Organizations: Yes    Attends Engineer, structural: 1 to 4 times per year    Marital Status: Married    Tobacco Counseling Counseling given: Not Answered    Clinical Intake:  Pre-visit preparation completed: Yes  Pain : No/denies pain     BMI - recorded: 27.62 Nutritional Status: BMI 25 -29 Overweight Nutritional Risks: None Diabetes: Yes CBG done?: No Did pt. bring in CBG monitor from home?: No  Lab Results  Component Value Date   HGBA1C 6.8 (H) 10/03/2022   HGBA1C 6.4 (A) 06/30/2022   HGBA1C 7.2 (H) 09/29/2021     How often do you need to have someone help you when you read instructions, pamphlets, or other written materials from your doctor or pharmacy?: 1 - Never  Interpreter Needed?: No  Information entered by :: Farris Hong LPN   Activities of Daily Living     07/13/2023    3:11 PM 07/10/2023    8:01 AM  In your present state of health, do you have any difficulty performing the following activities:  Hearing? 0 0  Vision? 0 0  Difficulty concentrating or making decisions? 0 0  Walking or climbing stairs? 0 0  Dressing or bathing? 0 0  Doing errands,  shopping? 0 0  Preparing Food and eating ? N N  Using the Toilet? N N  In the past six months, have you accidently leaked urine? N N  Do you have problems with loss of bowel control? N N  Managing your Medications? N N  Managing your Finances? N N  Housekeeping or managing your Housekeeping? N N    Patient Care Team: Alto Atta, NP as PCP - General (Family Medicine) Bridget Campion, MD as Consulting Physician (Physical Medicine and Rehabilitation) Shirlee Dotter, MD (Inactive) as Consulting Physician (Orthopedic Surgery)  I have updated your Care Teams any recent Medical Services you may have received from other providers in the past year.     Assessment:    This is a routine wellness examination for Jacob Stone.  Hearing/Vision screen Hearing Screening - Comments:: Denies hearing difficulties   Vision Screening - Comments:: Wears rx glasses - up to date with routine eye exams with  Dr Candi Chafe   Goals Addressed               This Visit's Progress     Increase physical activity (pt-stated)        Lose weight.       Depression Screen     07/13/2023    2:56 PM 10/03/2022    2:25 PM 06/21/2022    1:57 PM 11/01/2021    9:37 AM 09/29/2021    9:34 AM 06/16/2021   11:44 AM 03/30/2021    4:19 PM  PHQ 2/9 Scores  PHQ - 2 Score 0 0 0 0 0 0 0  PHQ- 9 Score  0  0 2      Fall Risk     07/13/2023    3:11 PM 07/10/2023    8:01 AM 06/27/2023   10:53 AM 06/21/2022    2:14 PM 06/20/2022  2:43 PM  Fall Risk   Falls in the past year? 0 0 0 0 0  Number falls in past yr: 0 0 0 0 0  Injury with Fall? 0 0 0 0 0  Risk for fall due to : No Fall Risks   No Fall Risks   Follow up Falls evaluation completed   Falls prevention discussed     MEDICARE RISK AT HOME:  Medicare Risk at Home Any stairs in or around the home?: Yes If so, are there any without handrails?: No Home free of loose throw rugs in walkways, pet beds, electrical cords, etc?: No Adequate lighting in your home to  reduce risk of falls?: Yes Life alert?: No Use of a cane, walker or w/c?: No Grab bars in the bathroom?: Yes Shower chair or bench in shower?: No Elevated toilet seat or a handicapped toilet?: Yes  TIMED UP AND GO:  Was the test performed?  Yes  Length of time to ambulate 10 feet: 10 sec Gait steady and fast without use of assistive device  Cognitive Function: 6CIT completed    12/10/2017   11:50 AM  MMSE - Mini Mental State Exam  Not completed: --        07/13/2023    3:12 PM 06/21/2022    2:15 PM 06/16/2021   11:51 AM 03/18/2019   11:30 AM  6CIT Screen  What Year? 0 points 0 points 0 points 0 points  What month? 0 points 0 points 0 points 0 points  What time? 0 points 0 points 0 points 0 points  Count back from 20 0 points 0 points 0 points 0 points  Months in reverse 0 points 0 points 0 points 0 points  Repeat phrase 0 points 2 points 0 points 0 points  Total Score 0 points 2 points 0 points 0 points    Immunizations Immunization History  Administered Date(s) Administered   Fluad Quad(high Dose 65+) 11/06/2018, 11/09/2020   Fluad Trivalent(High Dose 65+) 11/07/2022   Influenza Split 11/18/2010, 11/10/2011   Influenza Whole 11/09/2009   Influenza, High Dose Seasonal PF 11/25/2016, 11/23/2017   Influenza,inj,Quad PF,6+ Mos 11/28/2012   Influenza-Unspecified 12/02/2013, 11/28/2014, 11/21/2015   PFIZER Comirnaty(Gray Top)Covid-19 Tri-Sucrose Vaccine 06/02/2020   PFIZER(Purple Top)SARS-COV-2 Vaccination 03/22/2019, 04/16/2019, 11/05/2019   Pneumococcal Conjugate-13 04/06/2014   Pneumococcal Polysaccharide-23 06/18/2017   Td 07/30/2002, 04/06/2014   Zoster Recombinant(Shingrix) 06/23/2020, 10/10/2020   Zoster, Live 03/02/2011    Screening Tests Health Maintenance  Topic Date Due   FOOT EXAM  09/30/2022   COVID-19 Vaccine (5 - 2024-25 season) 10/08/2022   HEMOGLOBIN A1C  04/05/2023   OPHTHALMOLOGY EXAM  07/18/2023   INFLUENZA VACCINE  09/07/2023   Diabetic kidney  evaluation - Urine ACR  10/03/2023   Diabetic kidney evaluation - eGFR measurement  03/20/2024   DTaP/Tdap/Td (3 - Tdap) 04/05/2024   Colonoscopy  05/25/2024   Medicare Annual Wellness (AWV)  07/12/2024   Pneumonia Vaccine 13+ Years old  Completed   Hepatitis C Screening  Completed   Zoster Vaccines- Shingrix  Completed   HPV VACCINES  Aged Out   Meningococcal B Vaccine  Aged Out    Health Maintenance  Health Maintenance Due  Topic Date Due   FOOT EXAM  09/30/2022   COVID-19 Vaccine (5 - 2024-25 season) 10/08/2022   HEMOGLOBIN A1C  04/05/2023   Health Maintenance Items Addressed:   Additional Screening:  Vision Screening: Recommended annual ophthalmology exams for early detection of glaucoma and other disorders of  the eye. Would you like a referral to an eye doctor? No    Dental Screening: Recommended annual dental exams for proper oral hygiene  Community Resource Referral / Chronic Care Management: CRR required this visit?  No   CCM required this visit?  No   Plan:    I have personally reviewed and noted the following in the patient's chart:   Medical and social history Use of alcohol, tobacco or illicit drugs  Current medications and supplements including opioid prescriptions. Patient is not currently taking opioid prescriptions. Functional ability and status Nutritional status Physical activity Advanced directives List of other physicians Hospitalizations, surgeries, and ER visits in previous 12 months Vitals Screenings to include cognitive, depression, and falls Referrals and appointments  In addition, I have reviewed and discussed with patient certain preventive protocols, quality metrics, and best practice recommendations. A written personalized care plan for preventive services as well as general preventive health recommendations were provided to patient.   Dewayne Ford, LPN   1/0/2725   After Visit Summary: (In Person-Printed) AVS printed and  given to the patient  Notes: Nothing significant to report at this time.

## 2023-07-31 DIAGNOSIS — H35033 Hypertensive retinopathy, bilateral: Secondary | ICD-10-CM | POA: Diagnosis not present

## 2023-07-31 DIAGNOSIS — H25813 Combined forms of age-related cataract, bilateral: Secondary | ICD-10-CM | POA: Diagnosis not present

## 2023-07-31 DIAGNOSIS — E119 Type 2 diabetes mellitus without complications: Secondary | ICD-10-CM | POA: Diagnosis not present

## 2023-07-31 DIAGNOSIS — H532 Diplopia: Secondary | ICD-10-CM | POA: Diagnosis not present

## 2023-07-31 DIAGNOSIS — H04123 Dry eye syndrome of bilateral lacrimal glands: Secondary | ICD-10-CM | POA: Diagnosis not present

## 2023-07-31 LAB — HM DIABETES EYE EXAM

## 2023-08-01 ENCOUNTER — Encounter: Payer: Self-pay | Admitting: Adult Health

## 2023-08-21 ENCOUNTER — Other Ambulatory Visit: Payer: Self-pay | Admitting: Adult Health

## 2023-08-21 DIAGNOSIS — E139 Other specified diabetes mellitus without complications: Secondary | ICD-10-CM

## 2023-09-18 ENCOUNTER — Other Ambulatory Visit: Payer: Self-pay | Admitting: Adult Health

## 2023-10-17 ENCOUNTER — Encounter: Admitting: Adult Health

## 2023-10-26 ENCOUNTER — Ambulatory Visit: Admitting: Adult Health

## 2023-10-26 VITALS — BP 120/70 | HR 78 | Temp 98.0°F | Ht 68.25 in | Wt 194.0 lb

## 2023-10-26 DIAGNOSIS — Z8546 Personal history of malignant neoplasm of prostate: Secondary | ICD-10-CM

## 2023-10-26 DIAGNOSIS — G4733 Obstructive sleep apnea (adult) (pediatric): Secondary | ICD-10-CM | POA: Diagnosis not present

## 2023-10-26 DIAGNOSIS — E785 Hyperlipidemia, unspecified: Secondary | ICD-10-CM | POA: Diagnosis not present

## 2023-10-26 DIAGNOSIS — E559 Vitamin D deficiency, unspecified: Secondary | ICD-10-CM

## 2023-10-26 DIAGNOSIS — Z8739 Personal history of other diseases of the musculoskeletal system and connective tissue: Secondary | ICD-10-CM

## 2023-10-26 DIAGNOSIS — E119 Type 2 diabetes mellitus without complications: Secondary | ICD-10-CM

## 2023-10-26 DIAGNOSIS — I10 Essential (primary) hypertension: Secondary | ICD-10-CM

## 2023-10-26 DIAGNOSIS — Z0000 Encounter for general adult medical examination without abnormal findings: Secondary | ICD-10-CM

## 2023-10-26 DIAGNOSIS — Z7984 Long term (current) use of oral hypoglycemic drugs: Secondary | ICD-10-CM | POA: Diagnosis not present

## 2023-10-26 NOTE — Progress Notes (Signed)
 Subjective:    Patient ID: Jacob Stone., male    DOB: 04-07-1950, 72 y.o.   MRN: [id number]  HPI Patient presents for yearly preventative medicine examination. He is a pleasant 73 year old male who  has a past medical history of Carotid artery calcification (03/2011), Diabetes type 2, controlled (HCC), Diverticulitis, Fatty liver, FH: kidney cancer (2005), GERD (gastroesophageal reflux disease), Gout, History of kidney stones, HTN (hypertension), Hyperlipidemia, OA (osteoarthritis), OSA (obstructive sleep apnea) (04/18/2016), Plantar fasciitis, Prostate cancer (HCC), and Seasonal allergies.  Diabetes mellitus type II-he is managed with metformin  500 mg daily.  He does not monitor his blood sugars at home.  Denies episodes of hypoglycemia.  His A1c has been controlled for a number of years.   Lab Results  Component Value Date   HGBA1C 6.8 (H) 10/03/2022   HGBA1C 6.4 (A) 06/30/2022   HGBA1C 7.2 (H) 09/29/2021   Hypertension -managed with Norvasc  10 mg daily, HCTZ 25 mg daily, and Cozaar  25 mg daily.  He denies dizziness, lightheadedness, shortness of breath, or chest pain BP Readings from Last 3 Encounters:  10/26/23 120/70  07/13/23 118/62  04/18/23 115/76   Hyperlipidemia-managed with lipitor 20 mg, he denies myalgia Lab Results  Component Value Date   CHOL 132 10/03/2022   HDL 44.40 10/03/2022   LDLCALC 29 09/23/2020   LDLDIRECT 76.0 10/03/2022   TRIG 273.0 (H) 10/03/2022   CHOLHDL 3 10/03/2022   History of gout-currently controlled on allopurinol  300 mg daily.  He denies any recent gout flares Lab Results  Component Value Date   LABURIC 7.8 04/09/2014   History of prostate cancer-had seed implant in June 2019.  He is followed by [facility] urology every 6 months.He is taking Flomax 0.4 mg to help with urgency.   Vitamin D  Deficiency - takes 1000 units daily  Last vitamin D  Lab Results  Component Value Date   VD25OH 53.20 10/03/2022   OSA - was last seen by pulmonary  in 2019 - he cannot wear his CPAP due to being on a full face mask. He reports chronic fatigue and poor sleep . Is interested in being referred back to pulmonary to discuss the [facility] device.  He dos report fatigue most days of the week.    All immunizations and health maintenance protocols were reviewed with the patient and needed orders were placed.  Appropriate screening laboratory values were ordered for the patient including screening of hyperlipidemia, renal function and hepatic function. If indicated by BPH, a PSA was ordered.  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  Wt Readings from Last 3 Encounters:  10/26/23 194 lb (88 kg)  07/13/23 187 lb (84.8 kg)  04/18/23 185 lb (83.9 kg)       Review of Systems  Constitutional:  Positive for fatigue.  HENT: Negative.    Eyes: Negative.   Respiratory: Negative.    Cardiovascular: Negative.   Gastrointestinal: Negative.   Endocrine: Negative.   Genitourinary: Negative.   Musculoskeletal: Negative.   Skin: Negative.   Allergic/Immunologic: Negative.   Neurological: Negative.   Hematological: Negative.   Psychiatric/Behavioral: Negative.    All other systems reviewed and are negative.  Past Medical History:  Diagnosis Date   Carotid artery calcification 03/2011   MILD HARD PLAQUE LEFT BULB, 0-39% BIL. ICA STENOSIS, PATENT VERTEBRAL ARTERIES WITH ANTEGRADE FLOW   Diabetes type 2, controlled (HCC)    Diverticulitis    Fatty liver    FH: kidney cancer 2005   right kidney removed    GERD (gastroesophageal reflux disease)    Gout    History of kidney stones    Left kidney   HTN (hypertension)    Hyperlipidemia     OA (osteoarthritis)    Knee   OSA (obstructive sleep apnea) 04/18/2016   SEVERE; UNABLE TO WEAR CPAP MORE THAN 2-3 HOURS PER NIGHT   Plantar fasciitis    Prostate cancer (HCC)    Seasonal allergies     Social History   Socioeconomic History   Marital status: Married    Spouse name: Not on file   Number of children: Not on file   Years of education: Not on file   Highest education level: Bachelor's degree (e.g., BA, AB, BS)  Occupational History   Occupation: retired    Comment: Designer, industrial/product   Occupation: part time    Comment: golf course  Tobacco Use   Smoking status: Never   Smokeless tobacco: Never  Vaping Use   Vaping status: Never Used  Substance and Sexual Activity   Alcohol use: Yes    Alcohol/week: 1.0 standard drink of alcohol    Types: 1 Cans of beer per week    Comment: rare   Drug use: No   Sexual activity: Yes  Other Topics Concern   Not on file  Social History Narrative   HH 2   Married wife with compromised immune system   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 - > 5 miles per day   Social Drivers of Corporate investment banker Strain: Low Risk  (10/26/2023)   Overall Financial Resource Strain (CARDIA)    Difficulty of Paying Living Expenses: Not very hard  Food Insecurity: No Food Insecurity (10/26/2023)   Hunger Vital Sign    Worried About Running Out of Food in the Last Year: Never true    Ran Out of Food in the Last Year: Never true  Transportation Needs: No Transportation Needs (10/26/2023)   PRAPARE - Administrator, Civil Service (Medical): No    Lack of Transportation (Non-Medical): No  Physical Activity: Insufficiently Active (10/26/2023)   Exercise Vital Sign    Days of Exercise per Week: 3 days    Minutes of Exercise per Session: 40 min  Stress: No Stress Concern Present (10/26/2023)   [employer] of Occupational Health - Occupational Stress Questionnaire    Feeling of Stress: Not  at all  Social Connections: Moderately Integrated (10/26/2023)   Social Connection and Isolation Panel    Frequency of Communication with Friends and Family: Once a week    Frequency of Social Gatherings with Friends and Family: Three times a week    Attends Religious Services: 1 to 4 times per year    Active Member of Clubs or Organizations: No    Attends Banker Meetings: Not on file    Marital Status: Married  Catering manager Violence: Not At Risk (07/13/2023)   Humiliation, Afraid, Rape, and Kick questionnaire    Fear of Current or Ex-Partner: No    Emotionally Abused: No    Physically Abused: No    Sexually Abused:  No    Past Surgical History:  Procedure Laterality Date   COLON SURGERY  2011   Partial colon removed due to diverticulitis   COLONOSCOPY  08/2009   CYSTOSCOPY/RETROGRADE/URETEROSCOPY Left 03/14/2023   Procedure: CYSTOSCOPY/RETROGRADE/URETEROSCOPY/LASER LITHOTRIPSY,URETERAL STENT PLACEMENT,LEFT;  Surgeon: Roseann Adine PARAS., MD;  Location: [facility];  Service: Urology;  Laterality: Left;   NEPHRECTOMY Right    PROSTATE BIOPSY     RADIOACTIVE SEED IMPLANT N/A 08/03/2017   Procedure: RADIOACTIVE SEED IMPLANT/BRACHYTHERAPY IMPLANT;  Surgeon: Ottelin, Mark, MD;  Location: [facility];  Service: Urology;  Laterality: N/A;   SPACE OAR INSTILLATION N/A 08/03/2017   Procedure: SPACE OAR INSTILLATION;  Surgeon: Ottelin, Mark, MD;  Location: [facility];  Service: Urology;  Laterality: N/A;    Family History  Problem Relation Age of Onset   Heart disease Mother        heart failure - 51 and 54 when she passed    Stroke Mother    Cancer Father        wagners   Glaucoma Father        wagoner's glaucoma   Hypertension Other        multiple family members    Allergies  Allergen Reactions   Oxycodone Other (See Comments)    Patient stated he has nightmares with oxycodone    Current Outpatient Medications on File Prior to Visit   Medication Sig Dispense Refill   allopurinol  (ZYLOPRIM ) 300 MG tablet TAKE 1 TABLET(300 MG) BY MOUTH DAILY 90 tablet 3   amLODipine  (NORVASC ) 10 MG tablet TAKE 1 TABLET(10 MG) BY MOUTH DAILY 90 tablet 3   aspirin 81 MG chewable tablet Chew by mouth.     atorvastatin  (LIPITOR) 20 MG tablet TAKE 1 TABLET(20 MG) BY MOUTH DAILY 90 tablet 0   cetirizine (ZYRTEC) 10 MG tablet Take 10 mg by mouth daily.     Cholecalciferol (VITAMIN D ) 125 MCG (5000 UT) CAPS Take by mouth daily.     Docusate Calcium  ([employer] STOOL SOFTENER PO) Take by mouth daily.     losartan  (COZAAR ) 25 MG tablet TAKE 1 TABLET BY MOUTH DAILY 90 tablet 0   metFORMIN  (GLUCOPHAGE ) 500 MG tablet TAKE 1 TABLET BY MOUTH BEFORE BREAKFAST 30 tablet 0   Misc Natural Products (FIBER 7 PO) Take 2 tablets by mouth daily.      Multiple Vitamins-Minerals (DAILY MULTI 50+ PO) Take 1 tablet by mouth daily.     tamsulosin (FLOMAX) 0.4 MG CAPS capsule Take 0.4 mg by mouth daily.     No current facility-administered medications on file prior to visit.    BP 120/70   Pulse 78   Temp 98 F (36.7 C) (Oral)   Ht 5' 8.25 (1.734 m)   Wt 194 lb (88 kg)   SpO2 96%   BMI 29.28 kg/m       Objective:   Physical Exam Vitals and nursing note reviewed.  Constitutional:      General: He is not in acute distress.    Appearance: Normal appearance. He is obese. He is not ill-appearing.  HENT:     Head: Normocephalic and atraumatic.     Right Ear: Tympanic membrane, ear canal and external ear normal. There is no impacted cerumen.     Left Ear: Tympanic membrane, ear canal and external ear normal. There is no impacted cerumen.     Nose: Nose normal. No congestion or rhinorrhea.     Mouth/Throat:     Mouth:  Mucous membranes are moist.     Pharynx: Oropharynx is clear.  Eyes:     Extraocular Movements: Extraocular movements intact.     Conjunctiva/sclera: Conjunctivae normal.     Pupils: Pupils are equal, round, and reactive to light.  Neck:      Vascular: No carotid bruit.  Cardiovascular:     Rate and Rhythm: Normal rate and regular rhythm.     Pulses: Normal pulses.     Heart sounds: No murmur heard.    No friction rub. No gallop.  Pulmonary:     Effort: Pulmonary effort is normal.     Breath sounds: Normal breath sounds.  Abdominal:     General: Abdomen is flat. Bowel sounds are normal. There is no distension.     Palpations: Abdomen is soft. There is no mass.     Tenderness: There is no abdominal tenderness. There is no guarding or rebound.     Hernia: No hernia is present.  Musculoskeletal:        General: Normal range of motion.     Cervical back: Normal range of motion and neck supple.  Lymphadenopathy:     Cervical: No cervical adenopathy.  Skin:    General: Skin is warm and dry.     Capillary Refill: Capillary refill takes less than 2 seconds.  Neurological:     General: No focal deficit present.     Mental Status: He is alert and oriented to person, place, and time.  Psychiatric:        Mood and Affect: Mood normal.        Behavior: Behavior normal.        Thought Content: Thought content normal.        Judgment: Judgment normal.       Assessment & Plan:  1. Routine general medical examination at a health care facility Today patient counseled on age appropriate routine health concerns for screening and prevention, each reviewed and up to date or declined. Immunizations reviewed and up to date or declined. Labs ordered and reviewed. Risk factors for depression reviewed and negative. Hearing function and visual acuity are intact. ADLs screened and addressed as needed. Functional ability and level of safety reviewed and appropriate. Education, counseling and referrals performed based on assessed risks today. Patient provided with a copy of personalized plan for preventive services. - Work on weight loss through diet and exercise - Follow up in one year or sooner if needed  2. Diabetes mellitus treated with oral  medication (HCC) - Consider increase in statin  - Lipid panel; Future - TSH; Future - CBC; Future - Comprehensive metabolic panel with GFR; Future - Hemoglobin A1c; Future - Microalbumin/Creatinine Ratio, Urine; Future - Microalbumin/Creatinine Ratio, Urine - Hemoglobin A1c - Comprehensive metabolic panel with GFR - CBC - TSH - Lipid panel  3. Essential hypertension - Well controlled. No change in medication - Lipid panel; Future - TSH; Future - CBC; Future - Comprehensive metabolic panel with GFR; Future - Comprehensive metabolic panel with GFR - CBC - TSH - Lipid panel  4. Hyperlipidemia, unspecified hyperlipidemia type - Consider increase in statin  - Lipid panel; Future - TSH; Future - CBC; Future - Comprehensive metabolic panel with GFR; Future - Comprehensive metabolic panel with GFR - CBC - TSH - Lipid panel  5. History of gout - Continue with Allopurinol   - Lipid panel; Future - TSH; Future - CBC; Future - Comprehensive metabolic panel with GFR; Future - Comprehensive metabolic panel with  GFR - CBC - TSH - Lipid panel  6. History of prostate cancer - Per urology  - PSA; Future - PSA  7. Vitamin D  deficiency  - VITAMIN D  25 Hydroxy (Vit-D Deficiency, Fractures); Future - VITAMIN D  25 Hydroxy (Vit-D Deficiency, Fractures)  8. OSA (obstructive sleep apnea) (Primary) - Refer to pulmonary to discuss inspire device. His insurance does not cover Zepboud - Lipid panel; Future - TSH; Future - CBC; Future - Comprehensive metabolic panel with GFR; Future - Comprehensive metabolic panel with GFR - CBC - TSH - Lipid panel - Ambulatory referral to Pulmonology  Darleene Shape, NP

## 2023-10-27 LAB — COMPREHENSIVE METABOLIC PANEL WITH GFR
AG Ratio: 1.9 (calc) (ref 1.0–2.5)
ALT: 30 U/L (ref 9–46)
AST: 22 U/L (ref 10–35)
Albumin: 4.5 g/dL (ref 3.6–5.1)
Alkaline phosphatase (APISO): 66 U/L (ref 35–144)
BUN/Creatinine Ratio: 15 (calc) (ref 6–22)
BUN: 22 mg/dL (ref 7–25)
CO2: 28 mmol/L (ref 20–32)
Calcium: 9.4 mg/dL (ref 8.6–10.3)
Chloride: 104 mmol/L (ref 98–110)
Creat: 1.46 mg/dL — ABNORMAL HIGH (ref 0.70–1.28)
Globulin: 2.4 g/dL (ref 1.9–3.7)
Glucose, Bld: 116 mg/dL — ABNORMAL HIGH (ref 65–99)
Potassium: 4.3 mmol/L (ref 3.5–5.3)
Sodium: 140 mmol/L (ref 135–146)
Total Bilirubin: 0.8 mg/dL (ref 0.2–1.2)
Total Protein: 6.9 g/dL (ref 6.1–8.1)
eGFR: 50 mL/min/1.73m2 — ABNORMAL LOW (ref >=60)

## 2023-10-27 LAB — LIPID PANEL
Cholesterol: 114 mg/dL (ref <200)
HDL: 45 mg/dL (ref >=40)
LDL Cholesterol (Calc): 34 mg/dL
Non-HDL Cholesterol (Calc): 69 mg/dL (ref <130)
Total CHOL/HDL Ratio: 2.5 (calc) (ref <5.0)
Triglycerides: 334 mg/dL — ABNORMAL HIGH (ref <150)

## 2023-10-27 LAB — CBC
HCT: 44.7 % (ref 38.5–50.0)
Hemoglobin: 14.9 g/dL (ref 13.2–17.1)
MCH: 31.5 pg (ref 27.0–33.0)
MCHC: 33.3 g/dL (ref 32.0–36.0)
MCV: 94.5 fL (ref 80.0–100.0)
MPV: 8.8 fL (ref 7.5–12.5)
Platelets: 242 Thousand/uL (ref 140–400)
RBC: 4.73 Million/uL (ref 4.20–5.80)
RDW: 12.9 % (ref 11.0–15.0)
WBC: 7 Thousand/uL (ref 3.8–10.8)

## 2023-10-27 LAB — MICROALBUMIN / CREATININE URINE RATIO
Creatinine, Urine: 136 mg/dL (ref 20–320)
Microalb Creat Ratio: 63 mg/g{creat} — ABNORMAL HIGH (ref <30)
Microalb, Ur: 8.6 mg/dL

## 2023-10-27 LAB — TSH: TSH: 2.56 m[IU]/L (ref 0.40–4.50)

## 2023-10-27 LAB — HEMOGLOBIN A1C
Hgb A1c MFr Bld: 7.5 % — ABNORMAL HIGH (ref <5.7)
Mean Plasma Glucose: 169 mg/dL
eAG (mmol/L): 9.3 mmol/L

## 2023-10-27 LAB — PSA: PSA: 0.16 ng/mL (ref <=4.00)

## 2023-10-27 LAB — VITAMIN D 25 HYDROXY (VIT D DEFICIENCY, FRACTURES): Vit D, 25-Hydroxy: 48 ng/mL (ref 30–100)

## 2023-10-30 ENCOUNTER — Ambulatory Visit: Payer: Self-pay | Admitting: Adult Health

## 2023-11-01 ENCOUNTER — Other Ambulatory Visit: Payer: Self-pay

## 2023-11-01 DIAGNOSIS — E139 Other specified diabetes mellitus without complications: Secondary | ICD-10-CM

## 2023-11-01 MED ORDER — METFORMIN HCL 500 MG PO TABS: ORAL_TABLET | ORAL | 0 refills | Status: DC

## 2023-11-03 ENCOUNTER — Other Ambulatory Visit: Payer: Self-pay | Admitting: Adult Health

## 2023-11-03 DIAGNOSIS — E139 Other specified diabetes mellitus without complications: Secondary | ICD-10-CM

## 2023-12-04 DIAGNOSIS — H903 Sensorineural hearing loss, bilateral: Secondary | ICD-10-CM | POA: Diagnosis not present

## 2023-12-05 ENCOUNTER — Ambulatory Visit (INDEPENDENT_AMBULATORY_CARE_PROVIDER_SITE_OTHER)

## 2023-12-05 DIAGNOSIS — Z23 Encounter for immunization: Secondary | ICD-10-CM

## 2023-12-09 ENCOUNTER — Other Ambulatory Visit: Payer: Self-pay

## 2023-12-09 DIAGNOSIS — E139 Other specified diabetes mellitus without complications: Secondary | ICD-10-CM

## 2023-12-09 NOTE — Progress Notes (Signed)
   12/09/2023  Patient ID: Jacob Stone., male   DOB: October 11, 1950, 73 y.o.   MRN: [id number]  Pharmacy Quality Measure Review  This patient is appearing on a report for being at risk of failing the Glycemic Status Assessment in Diabetes measure this calendar year.    Last documented A1c 7.5 on 10/26/23  Jon VEAR Lindau, PharmD Clinical Pharmacist [phone]

## 2023-12-12 ENCOUNTER — Other Ambulatory Visit: Payer: Self-pay | Admitting: Adult Health

## 2023-12-19 ENCOUNTER — Other Ambulatory Visit: Payer: Self-pay | Admitting: Adult Health

## 2023-12-27 ENCOUNTER — Encounter: Payer: Self-pay | Admitting: Nurse Practitioner

## 2023-12-27 ENCOUNTER — Ambulatory Visit: Admitting: Nurse Practitioner

## 2023-12-27 VITALS — BP 118/66 | HR 78 | Ht 68.25 in | Wt 192.8 lb

## 2023-12-27 DIAGNOSIS — I10 Essential (primary) hypertension: Secondary | ICD-10-CM

## 2023-12-27 DIAGNOSIS — G4733 Obstructive sleep apnea (adult) (pediatric): Secondary | ICD-10-CM | POA: Diagnosis not present

## 2023-12-27 NOTE — Patient Instructions (Signed)
 Given your symptoms and history, I am concerned that you still have sleep disordered breathing with sleep apnea. You will need a repeat sleep study for further evaluation. Someone will contact you to schedule this.   We discussed how untreated sleep apnea puts an individual at risk for cardiac arrhthymias, pulm HTN, DM, stroke and increases their risk for daytime accidents. We also briefly reviewed treatment options including weight loss, side sleeping position, oral appliance, PAP therapy or referral to ENT for possible surgical options  After we get your sleep study results, you can decide if you'd like to retry PAP therapy and we will do an in lab titration study, or if you'd like to move forward with the Inspire device  Use caution when driving and pull over if you become sleepy.  Follow up in 6 weeks with Katie Iver Miklas,NP to go over sleep study results, or sooner, if needed.

## 2023-12-27 NOTE — Assessment & Plan Note (Signed)
 Hx of severe OSA, intolerant of PAP therapy. He still has ongoing symptoms of OSA including snoring, excessive daytime sleepiness, nocturnal apneic events, restless sleep. BMI 29. Given this,  I am concerned he still has sleep disordered breathing with obstructive sleep apnea. He will need sleep study for further evaluation.  Discussed the option of either an in lab CPAP titration study to see if we can get him settled on PAP therapy vs Inspire. Provided with information on Inspire device. Will discuss after results from HST received.    - discussed how weight can impact sleep and risk for sleep disordered breathing - discussed options to assist with weight loss: combination of diet modification, cardiovascular and strength training exercises   - had an extensive discussion regarding the adverse health consequences related to untreated sleep disordered breathing - specifically discussed the risks for hypertension, coronary artery disease, cardiac dysrhythmias, cerebrovascular disease, and diabetes - lifestyle modification discussed   - discussed how sleep disruption can increase risk of accidents, particularly when driving - safe driving practices were discussed  Patient Instructions  Given your symptoms and history, I am concerned that you still have sleep disordered breathing with sleep apnea. You will need a repeat sleep study for further evaluation. Someone will contact you to schedule this.   We discussed how untreated sleep apnea puts an individual at risk for cardiac arrhthymias, pulm HTN, DM, stroke and increases their risk for daytime accidents. We also briefly reviewed treatment options including weight loss, side sleeping position, oral appliance, PAP therapy or referral to ENT for possible surgical options  After we get your sleep study results, you can decide if you'd like to retry PAP therapy and we will do an in lab titration study, or if you'd like to move forward with the Inspire  device  Use caution when driving and pull over if you become sleepy.  Follow up in 6 weeks with Katie Carah Barrientes,NP to go over sleep study results, or sooner, if needed.

## 2023-12-27 NOTE — Progress Notes (Signed)
 @Patient  ID: Jacob Stone., male    DOB: [birth year], 73 y.o.   MRN: [id number]  Chief Complaint  Patient presents with   Consult    Sleep-Referred by Darleene Shape, NP. Pt had sleep study in 2018 and was prescribed CPAP- tried using for approx a year and could not tolerate. Still has machine at home from [employer] but has not used in approx 5 years. Epworth= 12. He has some snoring and spouse has witnessed apneas. He has some daytime sleepiness.     Referring provider: Shape Darleene, NP  HPI: 73 year old male, never smoker referred for sleep consult. Past medical history significant for HTN, allergic rhinitis, DM, prostate cancer, kidney cancer, HLD.   TEST/EVENTS:  2018 HST: AHI 72/h   12/27/2023: Today - sleep consult Discussed the use of AI scribe software for clinical note transcription with the patient, who gave verbal consent to proceed.  History of Present Illness Jacob Stone. Zell is a 73 year old male with severe sleep apnea who presents to re-establish care.   He has a history of severe sleep apnea and was prescribed CPAP therapy. He experienced significant difficulties tolerating the CPAP machine due to mask leakage, discomfort, and feelings of claustrophobia. Despite trying multiple masks and fittings, he has not been able to consistently use the CPAP. He remembers having an overnight in lab sleep study but these results are not in Epic that I can find so unclear if he ever had titration study.   He frequently snores at night, and his wife has observed episodes where he appears to stop breathing momentarily. No waking up choking or gasping for air. He does not experience morning headaches but reports excessive daytime sleepiness, particularly when inactive, such as watching TV or reading. He does not have issues with drowsy driving. No sleep parasomnias. Not currently on any PAP therapy. No O2 use.   He works a part-time job since retiring, which sometimes requires  him to wake up as early as 5 AM. On days he does not work, he wakes up around 8 or 9 AM. Goes to bed around 8-11 pm. Falls asleep easily but wakes multiple times. He feels he is not getting enough restorative sleep, regardless of the duration.  He has not been using any sleep medications and does not operate heavy machinery in his part-time job. He denies any significant weight gain. Lives with his wife.     12/27/2023    1:00 PM  Results of the Epworth flowsheet  Sitting and reading 3  Watching TV 1  Sitting, inactive in a public place (e.g. a theatre or a meeting) 1  As a passenger in a car for an hour without a break 1  Lying down to rest in the afternoon when circumstances permit 3  Sitting and talking to someone 0  Sitting quietly after a lunch without alcohol 3  In a car, while stopped for a few minutes in traffic 0  Total score 12       Allergies  Allergen Reactions   Oxycodone Other (See Comments)    Patient stated he has nightmares with oxycodone    Immunization History  Administered Date(s) Administered   Fluad Quad(high Dose 65+) 11/06/2018, 11/09/2020   Fluad Trivalent(High Dose 65+) 11/07/2022   INFLUENZA, HIGH DOSE SEASONAL PF 11/25/2016, 11/23/2017, 12/05/2023   Influenza Split 11/18/2010, 11/10/2011   Influenza Whole 11/09/2009   Influenza,inj,Quad PF,6+ Mos 11/28/2012   Influenza-Unspecified 12/02/2013, 11/28/2014, 11/21/2015  PFIZER Comirnaty(Gray Top)Covid-19 Tri-Sucrose Vaccine 06/02/2020   PFIZER(Purple Top)SARS-COV-2 Vaccination 03/22/2019, 04/16/2019, 11/05/2019   Pneumococcal Conjugate-13 04/06/2014   Pneumococcal Polysaccharide-23 06/18/2017   Td 07/30/2002, 04/06/2014   Zoster Recombinant(Shingrix) 06/23/2020, 10/10/2020   Zoster, Live 03/02/2011    Past Medical History:  Diagnosis Date   Carotid artery calcification 03/2011   MILD HARD PLAQUE LEFT BULB, 0-39% BIL. ICA STENOSIS, PATENT VERTEBRAL ARTERIES WITH ANTEGRADE FLOW   Diabetes type  2, controlled (HCC)    Diverticulitis    Fatty liver    FH: kidney cancer 2005   right kidney removed    GERD (gastroesophageal reflux disease)    Gout    History of kidney stones    Left kidney   HTN (hypertension)    Hyperlipidemia    OA (osteoarthritis)    Knee   OSA (obstructive sleep apnea) 04/18/2016   SEVERE; UNABLE TO WEAR CPAP MORE THAN 2-3 HOURS PER NIGHT   Plantar fasciitis    Prostate cancer (HCC)    Seasonal allergies     Tobacco History: Social History   Tobacco Use  Smoking Status Never  Smokeless Tobacco Never   Counseling given: Not Answered   Outpatient Medications Prior to Visit  Medication Sig Dispense Refill   allopurinol  (ZYLOPRIM ) 300 MG tablet TAKE 1 TABLET(300 MG) BY MOUTH DAILY 90 tablet 3   amLODipine  (NORVASC ) 10 MG tablet TAKE 1 TABLET(10 MG) BY MOUTH DAILY 90 tablet 3   aspirin 81 MG chewable tablet Chew by mouth.     atorvastatin  (LIPITOR) 20 MG tablet TAKE 1 TABLET(20 MG) BY MOUTH DAILY 90 tablet 0   cetirizine (ZYRTEC) 10 MG tablet Take 10 mg by mouth daily.     Cholecalciferol (VITAMIN D ) 125 MCG (5000 UT) CAPS Take by mouth daily.     Docusate Calcium  ([employer] STOOL SOFTENER PO) Take by mouth daily.     losartan  (COZAAR ) 25 MG tablet TAKE 1 TABLET BY MOUTH DAILY 90 tablet 0   metFORMIN  (GLUCOPHAGE ) 500 MG tablet TAKE 1 TABLET BY MOUTH BEFORE BREAKFASTTAKE 1 TABLET BY MOUTH BEFORE SUPPER 180 tablet 0   Misc Natural Products (FIBER 7 PO) Take 2 tablets by mouth daily.      Multiple Vitamins-Minerals (DAILY MULTI 50+ PO) Take 1 tablet by mouth daily.     tamsulosin (FLOMAX) 0.4 MG CAPS capsule Take 0.4 mg by mouth daily.     No facility-administered medications prior to visit.     Review of Systems: as above    Physical Exam:  BP 118/66   Pulse 78   Ht 5' 8.25 (1.734 m)   Wt 192 lb 12.8 oz (87.5 kg)   SpO2 99% Comment: on RA  BMI 29.10 kg/m   GEN: Pleasant, interactive, well-appearing; in no acute distress HEENT:   Normocephalic and atraumatic. PERRLA. Sclera white. Nasal turbinates pink, moist and patent bilaterally. No rhinorrhea present. Oropharynx pink and moist, without exudate or edema. No lesions, ulcerations, or postnasal drip. Mallampati IV NECK:  Supple w/ fair ROM. No lymphadenopathy.   CV: RRR, no m/r/g, no peripheral edema PULMONARY:  Unlabored, regular breathing. Clear bilaterally A&P w/o wheezes/rales/rhonchi. No accessory muscle use.  GI: BS present and normoactive. Soft, non-tender to palpation.  MSK: No erythema, warmth or tenderness.  Neuro: A/Ox3. No focal deficits noted.   Skin: Warm, no lesions or rashe Psych: Normal affect and behavior. Judgement and thought content appropriate.     Lab Results:  CBC    Component Value Date/Time  WBC 7.0 10/26/2023 1601   RBC 4.73 10/26/2023 1601   HGB 14.9 10/26/2023 1601   HCT 44.7 10/26/2023 1601   PLT 242 10/26/2023 1601   MCV 94.5 10/26/2023 1601   MCH 31.5 10/26/2023 1601   MCHC 33.3 10/26/2023 1601   RDW 12.9 10/26/2023 1601   LYMPHSABS 1.5 09/29/2021 1006   MONOABS 0.5 09/29/2021 1006   EOSABS 0.5 09/29/2021 1006   BASOSABS 0.0 09/29/2021 1006    BMET    Component Value Date/Time   NA 140 10/26/2023 1601   NA 142 03/21/2023 0933   K 4.3 10/26/2023 1601   CL 104 10/26/2023 1601   CO2 28 10/26/2023 1601   GLUCOSE 116 (H) 10/26/2023 1601   GLUCOSE 85 11/17/2005 1530   BUN 22 10/26/2023 1601   BUN 14 03/21/2023 0933   CREATININE 1.46 (H) 10/26/2023 1601   CALCIUM  9.4 10/26/2023 1601   GFRNONAA 37 (L) 03/13/2023 2234   GFRAA >60 07/27/2017 1122    BNP No results found for: BNP   Imaging:  No results found.  Administration History     None           No data to display          No results found for: NITRICOXIDE      Assessment & Plan:   OSA (obstructive sleep apnea) Hx of severe OSA, intolerant of PAP therapy. He still has ongoing symptoms of OSA including snoring, excessive daytime  sleepiness, nocturnal apneic events, restless sleep. BMI 29. Given this,  I am concerned he still has sleep disordered breathing with obstructive sleep apnea. He will need sleep study for further evaluation.  Discussed the option of either an in lab CPAP titration study to see if we can get him settled on PAP therapy vs Inspire. Provided with information on Inspire device. Will discuss after results from HST received.    - discussed how weight can impact sleep and risk for sleep disordered breathing - discussed options to assist with weight loss: combination of diet modification, cardiovascular and strength training exercises   - had an extensive discussion regarding the adverse health consequences related to untreated sleep disordered breathing - specifically discussed the risks for hypertension, coronary artery disease, cardiac dysrhythmias, cerebrovascular disease, and diabetes - lifestyle modification discussed   - discussed how sleep disruption can increase risk of accidents, particularly when driving - safe driving practices were discussed  Patient Instructions  Given your symptoms and history, I am concerned that you still have sleep disordered breathing with sleep apnea. You will need a repeat sleep study for further evaluation. Someone will contact you to schedule this.   We discussed how untreated sleep apnea puts an individual at risk for cardiac arrhthymias, pulm HTN, DM, stroke and increases their risk for daytime accidents. We also briefly reviewed treatment options including weight loss, side sleeping position, oral appliance, PAP therapy or referral to ENT for possible surgical options  After we get your sleep study results, you can decide if you'd like to retry PAP therapy and we will do an in lab titration study, or if you'd like to move forward with the Inspire device  Use caution when driving and pull over if you become sleepy.  Follow up in 6 weeks with Katie Sammy Cassar,NP to go  over sleep study results, or sooner, if needed.       Essential hypertension Reviewed correlation between OSA and HTN. BP well controlled. Follow up with PCP as scheduled  Advised  if symptoms do not improve or worsen, to please contact office for sooner follow up or seek emergency care.   I spent 45 minutes of dedicated to the care of this patient on the date of this encounter to include pre-visit review of records, face-to-face time with the patient discussing conditions above, post visit ordering of testing, clinical documentation with the electronic health record, making appropriate referrals as documented, and communicating necessary findings to members of the patients care team.  Comer LULLA Rouleau, NP 12/27/2023  Pt aware and understands NP's role.

## 2023-12-27 NOTE — Assessment & Plan Note (Signed)
 Reviewed correlation between OSA and HTN. BP well controlled. Follow up with PCP as scheduled

## 2024-01-07 ENCOUNTER — Telehealth: Payer: Self-pay | Admitting: Adult Health

## 2024-01-07 NOTE — Telephone Encounter (Unsigned)
 Copied from CRM #[id number]. Topic: Clinical - Medication Refill >> Jan 07, 2024 11:16 AM Emylou G wrote: Medication: atorvastatin  (LIPITOR) 20 MG tablet  Has the patient contacted their pharmacy? Yes (Agent: If no, request that the patient contact the pharmacy for the refill. If patient does not wish to contact the pharmacy document the reason why and proceed with request.) (Agent: If yes, when and what did the pharmacy advise?) said they sent it in?  This is the patient's preferred pharmacy:  [id number] [place], [place] - [address] AT [facility] [address] [facility] [address] [facility] [address] [place] [address] Phone: [phone] Fax: [phone]  Is this the correct pharmacy for this prescription? Yes If no, delete pharmacy and type the correct one.   Has the prescription been filled recently? No  Is the patient out of the medication? Yes  Has the patient been seen for an appointment in the last year OR does the patient have an upcoming appointment? Yes  Can we respond through MyChart? Yes  Agent: Please be advised that Rx refills may take up to 3 business days. We ask that you follow-up with your pharmacy.

## 2024-01-08 MED ORDER — ATORVASTATIN CALCIUM 20 MG PO TABS: 20.0000 mg | ORAL_TABLET | Freq: Every day | ORAL | 3 refills | Status: AC

## 2024-01-08 NOTE — Telephone Encounter (Signed)
 Requesting: atorvastatin  (LIPITOR) 20 MG tablet  Last Visit: 10/26/2023 Next Visit: 07/18/2024 Last Refill: 07/06/2023  Please Advise

## 2024-02-11 ENCOUNTER — Other Ambulatory Visit: Payer: Self-pay | Admitting: Adult Health

## 2024-02-11 DIAGNOSIS — E139 Other specified diabetes mellitus without complications: Secondary | ICD-10-CM

## 2024-02-12 ENCOUNTER — Telehealth: Payer: Self-pay

## 2024-02-12 DIAGNOSIS — G4733 Obstructive sleep apnea (adult) (pediatric): Secondary | ICD-10-CM

## 2024-02-12 NOTE — Telephone Encounter (Signed)
 PCC's, can you double check for me to see if pt completed the sleep test? If so, can you make sure this is in the chart?  If not, why wasn't it completed?   Thanks!

## 2024-02-12 NOTE — Telephone Encounter (Signed)
 We don't see a Home Sleep Test that's been ordered.

## 2024-02-13 ENCOUNTER — Ambulatory Visit: Admitting: Primary Care

## 2024-02-13 DIAGNOSIS — G4733 Obstructive sleep apnea (adult) (pediatric): Secondary | ICD-10-CM

## 2024-02-13 NOTE — Telephone Encounter (Signed)
 Order is in. Please scheduled ASAP due to delay. Thanks so much!

## 2024-02-14 ENCOUNTER — Other Ambulatory Visit: Payer: Self-pay | Admitting: Adult Health

## 2024-02-14 DIAGNOSIS — E139 Other specified diabetes mellitus without complications: Secondary | ICD-10-CM

## 2024-02-14 NOTE — Telephone Encounter (Signed)
 Called patient again to schedule for Sleep Study. LVM to call us  back to get scheduled asap.

## 2024-02-14 NOTE — Telephone Encounter (Signed)
 Left a message for the patient to schedule with us .

## 2024-02-15 NOTE — Telephone Encounter (Signed)
 Patient scheduled for HST on 1/19 at 10:00am. Called pt again to get rescheduled for Follow up. LVM for pt informing them they can give us  a call to get rescheduled for their Follow up visit.

## 2024-02-15 NOTE — Telephone Encounter (Signed)
 Yes, his f/u will need to be rescheduled. I would push it 4 weeks. Thanks.

## 2024-02-15 NOTE — Telephone Encounter (Signed)
 ATC X2. LMTCB

## 2024-02-15 NOTE — Telephone Encounter (Signed)
 LVM for Patient to get scheduled but at this point we might need to reschedule follow up.

## 2024-02-18 NOTE — Telephone Encounter (Signed)
 ATC x3.  Left detailed message per DPR x3 letting him know that I am canceling his appointment with our office on 02/19/24 since his HST has been rescheduled for 02/25/24 at 10 am.  Advised to call the office back to reschedule his OV after the 19th of January to review the results of his HST (allow 2weeks to get results).  Will await return call.  Mychart message sent as well.

## 2024-02-18 NOTE — Telephone Encounter (Signed)
 Copied from CRM #[id number]. Topic: Appointments - Scheduling Inquiry for Clinic >> Feb 15, 2024  9:44 AM [name] wrote: Reason for CRM: Patient returning call to [name] to get sleep study scheduled ASAP. Consulted [facility] due to multitude of attempts at outreach and note of ASAP by provider, CAL tx'ed to [place], conferenced call. NFN. >> Feb 15, 2024  2:42 PM [name] wrote: Patient [phone] returning the office call, regarding rescheduling follow up. Please advise and call back.   ----------------------------------------------------------------------------------------------------------------------------------------------  Patients HST is scheduled for 1/19 at 10 am.  Appointment cancelled for f/u on 1/13 since HST has not been done yet.  Nothing further needed.

## 2024-02-18 NOTE — Telephone Encounter (Signed)
 Closing encounter per policy due to no response by patient.

## 2024-02-19 ENCOUNTER — Ambulatory Visit: Admitting: Primary Care

## 2024-02-25 ENCOUNTER — Encounter

## 2024-03-14 ENCOUNTER — Telehealth: Payer: Self-pay | Admitting: Adult Health

## 2024-03-14 NOTE — Telephone Encounter (Signed)
 Copied from CRM [id number]. Topic: Referral - Request for Referral >> Mar 14, 2024  9:41 AM Viola F wrote: Did the patient discuss referral with their provider in the last year? Yes (If No - schedule appointment) (If Yes - send message)  Appointment offered? No  Type of order/referral and detailed reason for visit: yearly check up   Preference of office, provider, location: [facility] Dr. Rolan Molt   He also needs referrals for these specialist due to his insurance    [facility] - 03/26/24 [facility] - Dr. Selma , appt in June 2026  If referral order, have you been seen by this specialty before? Yes (If Yes, this issue or another issue? When? Where?  Can we respond through MyChart? Yes, he would like MyChart message with update on when the referrals are entered

## 2024-03-14 NOTE — Telephone Encounter (Signed)
 Okay for referral?

## 2024-03-17 ENCOUNTER — Ambulatory Visit: Admitting: Primary Care

## 2024-03-26 ENCOUNTER — Encounter

## 2024-04-15 ENCOUNTER — Ambulatory Visit: Admitting: Primary Care

## 2024-07-18 ENCOUNTER — Ambulatory Visit
